# Patient Record
Sex: Female | Born: 1950
Health system: Southern US, Community
[De-identification: ages and names within clinical notes are randomized; demographics above are authoritative.]

## PROBLEM LIST (undated history)

## (undated) DIAGNOSIS — N200 Calculus of kidney: Secondary | ICD-10-CM

## (undated) DIAGNOSIS — K2270 Barrett's esophagus without dysplasia: Secondary | ICD-10-CM

## (undated) DIAGNOSIS — E66811 Obesity, class 1: Secondary | ICD-10-CM

## (undated) DIAGNOSIS — E041 Nontoxic single thyroid nodule: Secondary | ICD-10-CM

## (undated) DIAGNOSIS — E559 Vitamin D deficiency, unspecified: Secondary | ICD-10-CM

## (undated) DIAGNOSIS — I87323 Chronic venous hypertension (idiopathic) with inflammation of bilateral lower extremity: Secondary | ICD-10-CM

## (undated) DIAGNOSIS — J189 Pneumonia, unspecified organism: Secondary | ICD-10-CM

## (undated) DIAGNOSIS — K915 Postcholecystectomy syndrome: Secondary | ICD-10-CM

## (undated) DIAGNOSIS — E669 Obesity, unspecified: Secondary | ICD-10-CM

## (undated) DIAGNOSIS — M8580 Other specified disorders of bone density and structure, unspecified site: Secondary | ICD-10-CM

## (undated) HISTORY — DX: Nontoxic single thyroid nodule: E04.1

## (undated) HISTORY — DX: Calculus of kidney: N20.0

## (undated) HISTORY — DX: Other specified disorders of bone density and structure, unspecified site: M85.80

## (undated) HISTORY — DX: Pneumonia, unspecified organism: J18.9

## (undated) HISTORY — PX: CHOLECYSTECTOMY: SHX55

## (undated) HISTORY — DX: Chronic venous hypertension (idiopathic) with inflammation of bilateral lower extremity: I87.323

## (undated) HISTORY — DX: Barrett's esophagus without dysplasia: K22.70

## (undated) HISTORY — DX: Obesity, unspecified: E66.9

## (undated) HISTORY — PX: LITHOTRIPSY: SUR834

## (undated) HISTORY — DX: Vitamin D deficiency, unspecified: E55.9

## (undated) HISTORY — DX: Obesity, class 1: E66.811

## (undated) HISTORY — DX: Postcholecystectomy syndrome: K91.5

## (undated) HISTORY — PX: TOTAL KNEE ARTHROPLASTY: SHX125

---

## 1970-05-06 HISTORY — PX: TONSILLECTOMY: SUR1361

## 1990-05-06 HISTORY — PX: ABDOMINAL HYSTERECTOMY: SHX81

## 1997-11-29 ENCOUNTER — Other Ambulatory Visit: Admission: RE | Admit: 1997-11-29 | Discharge: 1997-11-29 | Payer: Self-pay | Admitting: Obstetrics & Gynecology

## 1997-12-27 ENCOUNTER — Encounter: Payer: Self-pay | Admitting: Obstetrics & Gynecology

## 1997-12-27 ENCOUNTER — Ambulatory Visit (HOSPITAL_COMMUNITY): Admission: RE | Admit: 1997-12-27 | Discharge: 1997-12-27 | Payer: Self-pay | Admitting: Obstetrics & Gynecology

## 1998-05-06 HISTORY — PX: ANKLE FRACTURE SURGERY: SHX122

## 1998-10-03 ENCOUNTER — Ambulatory Visit (HOSPITAL_COMMUNITY): Admission: RE | Admit: 1998-10-03 | Discharge: 1998-10-03 | Payer: Self-pay | Admitting: Oncology

## 1998-11-15 ENCOUNTER — Other Ambulatory Visit: Admission: RE | Admit: 1998-11-15 | Discharge: 1998-11-15 | Payer: Self-pay | Admitting: Obstetrics & Gynecology

## 1999-02-05 ENCOUNTER — Encounter: Payer: Self-pay | Admitting: Obstetrics & Gynecology

## 1999-02-05 ENCOUNTER — Ambulatory Visit (HOSPITAL_COMMUNITY): Admission: RE | Admit: 1999-02-05 | Discharge: 1999-02-05 | Payer: Self-pay | Admitting: Obstetrics & Gynecology

## 1999-11-26 ENCOUNTER — Other Ambulatory Visit: Admission: RE | Admit: 1999-11-26 | Discharge: 1999-11-26 | Payer: Self-pay | Admitting: Obstetrics & Gynecology

## 2000-12-30 ENCOUNTER — Other Ambulatory Visit: Admission: RE | Admit: 2000-12-30 | Discharge: 2000-12-30 | Payer: Self-pay | Admitting: Obstetrics & Gynecology

## 2002-01-22 ENCOUNTER — Other Ambulatory Visit: Admission: RE | Admit: 2002-01-22 | Discharge: 2002-01-22 | Payer: Self-pay | Admitting: Obstetrics & Gynecology

## 2003-02-21 ENCOUNTER — Other Ambulatory Visit: Admission: RE | Admit: 2003-02-21 | Discharge: 2003-02-21 | Payer: Self-pay | Admitting: Family Medicine

## 2003-05-07 HISTORY — PX: GALLBLADDER SURGERY: SHX652

## 2003-05-26 ENCOUNTER — Encounter (INDEPENDENT_AMBULATORY_CARE_PROVIDER_SITE_OTHER): Payer: Self-pay | Admitting: Specialist

## 2003-05-26 ENCOUNTER — Ambulatory Visit (HOSPITAL_COMMUNITY): Admission: RE | Admit: 2003-05-26 | Discharge: 2003-05-27 | Payer: Self-pay | Admitting: Otolaryngology

## 2004-04-12 ENCOUNTER — Other Ambulatory Visit: Admission: RE | Admit: 2004-04-12 | Discharge: 2004-04-12 | Payer: Self-pay | Admitting: Obstetrics & Gynecology

## 2005-05-27 ENCOUNTER — Other Ambulatory Visit: Admission: RE | Admit: 2005-05-27 | Discharge: 2005-05-27 | Payer: Self-pay | Admitting: Obstetrics & Gynecology

## 2006-08-28 ENCOUNTER — Other Ambulatory Visit: Admission: RE | Admit: 2006-08-28 | Discharge: 2006-08-28 | Payer: Self-pay | Admitting: Interventional Radiology

## 2006-08-28 ENCOUNTER — Encounter (INDEPENDENT_AMBULATORY_CARE_PROVIDER_SITE_OTHER): Payer: Self-pay | Admitting: Specialist

## 2006-08-28 ENCOUNTER — Encounter: Admission: RE | Admit: 2006-08-28 | Discharge: 2006-08-28 | Payer: Self-pay | Admitting: Endocrinology

## 2007-02-16 ENCOUNTER — Encounter: Admission: RE | Admit: 2007-02-16 | Discharge: 2007-02-16 | Payer: Self-pay | Admitting: Endocrinology

## 2007-08-17 ENCOUNTER — Encounter: Admission: RE | Admit: 2007-08-17 | Discharge: 2007-08-17 | Payer: Self-pay | Admitting: Endocrinology

## 2010-05-06 HISTORY — PX: OTHER SURGICAL HISTORY: SHX169

## 2010-09-04 ENCOUNTER — Encounter (INDEPENDENT_AMBULATORY_CARE_PROVIDER_SITE_OTHER): Payer: Self-pay | Admitting: Surgery

## 2010-09-21 NOTE — Discharge Summary (Signed)
NAME:  Molly Hogan, Molly Hogan                     ACCOUNT NO.:  [id number]   MEDICAL RECORD NO.:  [id number]                   PATIENT TYPE:  OIB   LOCATION:  6706                                 FACILITY:  [facility]   PHYSICIAN:  Kinnie Scales. Annalee Genta, M.D.            DATE OF BIRTH:  [birth year]   DATE OF ADMISSION:  05/26/2003  DATE OF DISCHARGE:  05/27/2003                                 DISCHARGE SUMMARY   ADMISSION DIAGNOSIS:  Right submandibular mass.   DISCHARGE DIAGNOSIS:  Right submandibular mass.   SURGICAL PROCEDURE:  Transcervical excision of right submandibular gland, on  May 26, 2003.   The patient was discharged to home in stable condition.   PREOPERATIVE MEDICATIONS:  1. Nexium 40 mg p.o. b.i.d.  2. Advair two puffs p.o. q. day.  3. Oral vitamins.   ADDITIONAL DISCHARGE MEDICATIONS:  1. Percocet 5/325 one to two q. 4-6h. p.r.n. pain, dispense #30 without     refills.  2. Augmentin 500 mg p.o. b.i.d. for ten days.   ACTIVITY:  Patient activity limited, no lifting or straining or vigorous  physical activity.   DIET:  Should be advanced as tolerated to a soft diet.   WOUND CARE:  One-half strength hydrogen peroxide and Bacitracin ointment  applied to the surgical incision on a twice daily basis.   The patient will follow up in my office in one week for suture removal,  pathology review, and further postoperative care.   BRIEF HISTORY:  The patient is a 60 year old white female who is referred  for evaluation of a gradually enlarging mass in her right submandibular  space.  Evaluation revealed a 2 cm soft mass consistent with a right  submandibular gland.  CT scan was performed and there was no evidence of  mass or tumor within the gland and no lymphadenopathy or other abnormalities  noted.  Over the next several months the patient gradually had continued  asymptomatic enlargement of the right submandibular gland, and given her  history, I recommended that we  undertake transcervical excision of the gland  under general anesthesia.  The risks and benefits were discussed and the  patient was scheduled for admission to [facility] for surgical  procedure.   HOSPITAL COURSE:  The patient was brought to the operating room of [facility] to the main OR on May 26, 2003.  Under general anesthesia  a transcervical excision of the right submandibular gland was performed  without complications or difficulty.  The patient was transferred from the  operating room to recovery and from recovery to unit 6700 for postoperative  care.   The patient's postoperative course was uneventful.  She had moderate  discomfort, which was controlled with intravenous morphine or oral Percocet.  She was tolerating liquids and soft oral diet.  She was ambulating without  difficulty.  Normal bowel and bladder function.  No postoperative  complications.  On the first postoperative morning  the patient was  evaluated, there was no swelling or erythema of the wound, and her Al Pimple drain output had fallen to 10 cc per shift.  The drain was removed  without difficulty, and the patient was discharged to home in stable  condition, with the above discharge instructions.  The patient and her  husband are comfortable with the discharge planning, and will follow up with  me in one week for postoperative or sooner if warranted.                                                Kinnie Scales. Annalee Genta, M.D.    DLS/MEDQ  D:  78/29/5621  T:  05/28/2003  Job:  [id number]

## 2010-09-21 NOTE — Op Note (Signed)
NAME:  Molly Hogan, Molly Hogan                     ACCOUNT NO.:  [id number]   MEDICAL RECORD NO.:  [id number]                   PATIENT TYPE:  OIB   LOCATION:  2899                                 FACILITY:  [facility]   PHYSICIAN:  Kinnie Scales. Annalee Genta, M.D.            DATE OF BIRTH:  Aug 09, 1950   DATE OF PROCEDURE:  DATE OF DISCHARGE:                                 OPERATIVE REPORT   PREOPERATIVE DIAGNOSES:  Right submandibular mass.   POSTOPERATIVE DIAGNOSIS:  Right submandibular mass.   INDICATIONS FOR SURGERY:  Right submandibular mass.   SURGICAL PROCEDURE:  Transcervical excision of the right submandibular  gland.   ANESTHESIA:  General endotracheal.   SURGEON:  Kinnie Scales. Annalee Genta, M.D.   ASSISTANT:  Kathy Breach, M.D.   ESTIMATED BLOOD LOSS:  Approximately 50 cc.   COMPLICATIONS:  There were no complications.   DISPOSITION:  The patient was transferred from the operating room to the  recovery room in stable condition.   HISTORY:  Molly Hogan is a 60 year old white female who was referred for  evaluation of a gradually enlarging right submandibular mass.  Workup  included physical exam which showed a soft, approximately 2-cm firm mass in  the right submandibular space.  CT scan with and without contrast showed no  evidence of cervical lymphadenopathy or thyroid abnormality and the mass  appeared to be contained within the right submandibular gland.  Given the  patient's history, examination, and findings, I recommended that we  undertake transcervical excisional biopsy of the right submandibular gland  under general anesthesia.  The risks and benefits of the procedure were  discussed in detail.  The patient understood and concurred with our plan for  surgery, which is scheduled for May 26, 2003.   DESCRIPTION OF PROCEDURE:  The patient was brought to the operating room on  May 26, 2003, and placed in the supine position on the operating table.  General  endotracheal anesthesia was established without difficulty.  Once  the patient was adequately anesthetized, she was prepped and draped in  sterile fashion.  She was injected with 2 cc of 1% lidocaine with 1:100,000  solution of epinephrine injected in a subcutaneous fashion in the right  submandibular space along the proposed surgical incision site.  After  allowing adequate time for vasoconstriction, the patient's surgical  procedure was begun by creating an approximately 4-cm horizontally oriented  skin incision in a pre-existing skin crease.  This was carried through the  skin and underlying subcutaneous tissue.  Platysma muscle was identified and  divided.  The subplatysmal flaps were then elevated superiorly and  inferiorly.  The inferior aspect of the right submandibular gland was  identified and the overlying superficial fascia of the gland was elevated  superiorly, preserving the marginal mandibular nerve on the right-hand side.  The right submandibular gland was then gently dissected from the surrounding  tissues.  The common facial vein and artery were identified  and their  contributions to the right submandibular gland were divided and suture  ligated.  The gland was then mobilized.  The mylohyoid muscle was identified  and dissected anteriorly.  This allowed access to the submandibular space.  The submandibular duct was identified, divided, and suture ligated.  The  lingual nerve and hypoglossal nerves were identified in their normal  anatomic position.  Lingual nerve contribution to the submandibular gland  was identified, divided, and suture ligated, and the gland was removed from  the submandibular space and sent to Pathology for gross and microscopic  evaluation.  The patient's incision was then thoroughly irrigated with  saline solution and closed in multiple layers after placement of a 7-mm  Blake drain, which was brought out through a separate stab incision and  placed in  the depth of the wound.  The patient's incision was closed with 4-  0 Vicryl suture in interrupted fashion to reapproximate the platysma muscle  and deep subcutaneous tissue.  Final skin closure was achieved with a 5-0  Ethilon suture in a running locked fashion and the patient's wound was  dressed with bacitracin ointment.  The patient was then awakened from her  anesthetic.  She was extubated and was transferred from the operating room  to the recovery room in stable condition.  There were no complications.  Estimated blood loss was approximately 50 cc.                                               Kinnie Scales. Annalee Genta, M.D.    DLS/MEDQ  D:  21/30/8657  T:  05/26/2003  Job:  [id number]

## 2011-03-12 ENCOUNTER — Other Ambulatory Visit: Payer: Self-pay | Admitting: Obstetrics & Gynecology

## 2011-03-12 DIAGNOSIS — R928 Other abnormal and inconclusive findings on diagnostic imaging of breast: Secondary | ICD-10-CM

## 2011-03-29 ENCOUNTER — Ambulatory Visit
Admission: RE | Admit: 2011-03-29 | Discharge: 2011-03-29 | Disposition: A | Discharge status: Home / Self Care | Payer: 59 | Source: Ambulatory Visit | Attending: Obstetrics & Gynecology | Admitting: Obstetrics & Gynecology

## 2011-03-29 DIAGNOSIS — R928 Other abnormal and inconclusive findings on diagnostic imaging of breast: Secondary | ICD-10-CM

## 2011-05-07 HISTORY — PX: MENISCUS REPAIR: SHX5179

## 2011-05-28 ENCOUNTER — Other Ambulatory Visit (INDEPENDENT_AMBULATORY_CARE_PROVIDER_SITE_OTHER): Payer: Self-pay | Admitting: Surgery

## 2011-05-28 ENCOUNTER — Telehealth (INDEPENDENT_AMBULATORY_CARE_PROVIDER_SITE_OTHER): Payer: Self-pay | Admitting: Surgery

## 2011-05-28 DIAGNOSIS — E042 Nontoxic multinodular goiter: Secondary | ICD-10-CM

## 2011-05-28 NOTE — Telephone Encounter (Signed)
Robin,I made Ms Rincon a thyroid apt for 3/15..now she needs  u/s and labs done @ [facility] .Marland KitchenMarland KitchenThanks Liborio Nixon

## 2011-05-28 NOTE — Telephone Encounter (Signed)
Lab slip to [employer]- Ultrasound or placed to Debra to schedule 05/28/2011.

## 2011-06-12 ENCOUNTER — Ambulatory Visit
Admission: RE | Admit: 2011-06-12 | Discharge: 2011-06-12 | Disposition: A | Discharge status: Home / Self Care | Payer: 59 | Source: Ambulatory Visit | Attending: Surgery | Admitting: Surgery

## 2011-06-12 DIAGNOSIS — E042 Nontoxic multinodular goiter: Secondary | ICD-10-CM

## 2011-06-14 ENCOUNTER — Other Ambulatory Visit: Payer: 59

## 2011-07-04 ENCOUNTER — Encounter (INDEPENDENT_AMBULATORY_CARE_PROVIDER_SITE_OTHER): Payer: Self-pay | Admitting: Surgery

## 2011-07-08 ENCOUNTER — Encounter (INDEPENDENT_AMBULATORY_CARE_PROVIDER_SITE_OTHER): Payer: Self-pay | Admitting: Surgery

## 2011-07-08 ENCOUNTER — Ambulatory Visit (INDEPENDENT_AMBULATORY_CARE_PROVIDER_SITE_OTHER): Payer: 59 | Admitting: Surgery

## 2011-07-08 VITALS — BP 122/80 | HR 68 | Temp 97.8°F | Resp 16 | Ht 63.0 in | Wt 191.0 lb

## 2011-07-08 DIAGNOSIS — E042 Nontoxic multinodular goiter: Secondary | ICD-10-CM

## 2011-07-08 HISTORY — DX: Nontoxic multinodular goiter: E04.2

## 2011-07-08 NOTE — Progress Notes (Signed)
Visit Diagnoses: 1. Multinodular goiter (nontoxic)     HISTORY: Patient is a 61 year old white female followed for bilateral thyroid nodules. Patient has diagnostic studies dating back to 1995. She was last seen in my practice in October of 2010. At my request she underwent a followup thyroid ultrasound on June 12, 2011. This shows a normal sinus thyroid gland which is homogeneous. There is a tiny nodule in the inferior aspect of the right lobe measuring 4.8 mm. There is a complex nodule in the left lobe measuring 1.5 cm. These lesions are unchanged compared to her study in 2009.  Patient had a TSH level obtained on June 19, 2011. This was normal at 2.470. Patient has never been on thyroid hormone supplementation.  PERTINENT REVIEW OF SYSTEMS: No tremors. No palpitations. No new masses. No dysphagia. Mild globus sensation.  EXAM: HEENT: normocephalic; pupils equal and reactive; sclerae clear; dentition good; mucous membranes moist NECK:  Palpable soft nodule mid left thyroid lobe, mobile with swallowing, nontender; symmetric on extension; no palpable anterior or posterior cervical lymphadenopathy; no supraclavicular masses; no tenderness CHEST: clear to auscultation bilaterally without rales, rhonchi, or wheezes CARDIAC: regular rate and rhythm without significant murmur; peripheral pulses are full EXT:  non-tender without edema; no deformity NEURO: no gross focal deficits; no sign of tremor   IMPRESSION: Bilateral thyroid nodules, clinically stable  PLAN: The patient and I discussed the significance of the above findings. It is likely that these nodules will never become clinically significant. We will obtain a thyroid ultrasound and TSH level in 2 years. I will see her back in the office at that time. If her thyroid disease remains stable, then I think she can be followed safely by her primary care physician without further surgical followup unless there is a significant clinical  change.  Patient will return in 2 years for final evaluation.  Velora Heckler, MD, FACS General & Endocrine Surgery [facility]

## 2011-07-17 ENCOUNTER — Encounter (INDEPENDENT_AMBULATORY_CARE_PROVIDER_SITE_OTHER): Payer: Self-pay

## 2012-12-22 ENCOUNTER — Other Ambulatory Visit: Payer: Self-pay | Admitting: Obstetrics & Gynecology

## 2012-12-22 DIAGNOSIS — R234 Changes in skin texture: Secondary | ICD-10-CM

## 2012-12-22 DIAGNOSIS — N6451 Induration of breast: Secondary | ICD-10-CM

## 2012-12-22 DIAGNOSIS — N6459 Other signs and symptoms in breast: Secondary | ICD-10-CM

## 2013-01-07 ENCOUNTER — Other Ambulatory Visit: Payer: 59

## 2013-01-08 ENCOUNTER — Ambulatory Visit
Admission: RE | Admit: 2013-01-08 | Discharge: 2013-01-08 | Disposition: A | Discharge status: Home / Self Care | Payer: 59 | Source: Ambulatory Visit | Attending: Obstetrics & Gynecology | Admitting: Obstetrics & Gynecology

## 2013-01-08 DIAGNOSIS — R234 Changes in skin texture: Secondary | ICD-10-CM

## 2013-01-08 DIAGNOSIS — N6459 Other signs and symptoms in breast: Secondary | ICD-10-CM

## 2013-03-30 ENCOUNTER — Other Ambulatory Visit: Payer: Self-pay | Admitting: Obstetrics & Gynecology

## 2013-03-30 DIAGNOSIS — Z803 Family history of malignant neoplasm of breast: Secondary | ICD-10-CM

## 2013-04-13 ENCOUNTER — Ambulatory Visit
Admission: RE | Admit: 2013-04-13 | Discharge: 2013-04-13 | Disposition: A | Discharge status: Home / Self Care | Payer: 59 | Source: Ambulatory Visit | Attending: Obstetrics & Gynecology | Admitting: Obstetrics & Gynecology

## 2013-04-13 DIAGNOSIS — Z803 Family history of malignant neoplasm of breast: Secondary | ICD-10-CM

## 2013-04-13 MED ORDER — GADOBENATE DIMEGLUMINE 529 MG/ML IV SOLN
18.0000 mL | Freq: Once | INTRAVENOUS | Status: AC | PRN
  Administered 2013-04-13: 18 mL via INTRAVENOUS

## 2013-05-10 ENCOUNTER — Other Ambulatory Visit (INDEPENDENT_AMBULATORY_CARE_PROVIDER_SITE_OTHER): Payer: Self-pay

## 2013-05-10 ENCOUNTER — Telehealth (INDEPENDENT_AMBULATORY_CARE_PROVIDER_SITE_OTHER): Payer: Self-pay | Admitting: *Deleted

## 2013-05-10 DIAGNOSIS — E042 Nontoxic multinodular goiter: Secondary | ICD-10-CM

## 2013-05-10 NOTE — Telephone Encounter (Signed)
I spoke with pt and informed her of the appt for her thyroid US at [facility] on 05/14/13 with an arrival time of 1:00pm.  She is agreeable with this appt.

## 2013-05-13 ENCOUNTER — Other Ambulatory Visit: Payer: 59

## 2013-05-14 ENCOUNTER — Ambulatory Visit
Admission: RE | Admit: 2013-05-14 | Discharge: 2013-05-14 | Disposition: A | Discharge status: Home / Self Care | Payer: 59 | Source: Ambulatory Visit | Attending: Surgery | Admitting: Surgery

## 2013-05-14 DIAGNOSIS — E042 Nontoxic multinodular goiter: Secondary | ICD-10-CM

## 2013-05-17 ENCOUNTER — Encounter (INDEPENDENT_AMBULATORY_CARE_PROVIDER_SITE_OTHER): Payer: Self-pay | Admitting: Surgery

## 2013-05-17 ENCOUNTER — Ambulatory Visit (INDEPENDENT_AMBULATORY_CARE_PROVIDER_SITE_OTHER): Payer: 59 | Admitting: Surgery

## 2013-05-17 VITALS — BP 128/84 | HR 72 | Temp 98.1°F | Resp 16 | Ht 62.0 in | Wt 197.2 lb

## 2013-05-17 DIAGNOSIS — E042 Nontoxic multinodular goiter: Secondary | ICD-10-CM

## 2013-05-17 LAB — TSH: TSH: 2.564 u[IU]/mL (ref 0.350–4.500)

## 2013-05-17 NOTE — Patient Instructions (Signed)

## 2013-05-17 NOTE — Progress Notes (Signed)
General Surgery [facility]  Chief Complaint  Patient presents with  . Follow-up    thyroid nodules    HISTORY: Patient is a 63 year old female followed for bilateral thyroid nodules. Patient was last evaluated in March of 2013. In the interim she has had no significant changes in the management of her thyroid.  At my request she underwent a thyroid ultrasound on 05/14/2013. This shows a normal-sized thyroid gland with the right lobe measuring 4 cm and left lobe measuring 3.8 cm. There is a 6 mm cystic nodule in the lower pole of the right lobe which is unchanged from prior studies. There is a 15 mm nodule in the left lobe which is slightly decreased in size from her prior studies. No new nodules are identified very at no lymphadenopathy is identified.  Patient has not had a recent TSH level determination.  PERTINENT REVIEW OF SYSTEMS: Denies tremor. Denies palpitation. Denies new masses. Occasional shortness of breath.  EXAM: HEENT: normocephalic; pupils equal and reactive; sclerae clear; dentition good; mucous membranes moist NECK:  Thyroid gland is soft without discrete or dominant nodule palpable; symmetric on extension; no palpable anterior or posterior cervical lymphadenopathy; no supraclavicular masses; no tenderness CHEST: clear to auscultation bilaterally without rales, rhonchi, or wheezes CARDIAC: regular rate and rhythm without significant murmur; peripheral pulses are full EXT:  non-tender without edema; no deformity NEURO: no gross focal deficits; no sign of tremor   IMPRESSION: Bilateral thyroid nodules, clinically stable  PLAN: Patient and I reviewed the above results at length. We reviewed her ultrasound report. She has not had a recent TSH level and we will give her a laboratory slip to have that done later today.  At this point she does not require further surgical evaluation. Bilateral thyroid nodules have been stable over a number of years.  Patient should have annual physical examination. She should have an annual TSH level determination.  Patient will return for surgical care as needed.  Velora Heckler, MD, [facility] Office: [phone]  Visit Diagnoses: 1. Multiple thyroid nodules

## 2013-05-18 ENCOUNTER — Telehealth (INDEPENDENT_AMBULATORY_CARE_PROVIDER_SITE_OTHER): Payer: Self-pay | Admitting: General Surgery

## 2013-05-18 NOTE — Telephone Encounter (Signed)
Message copied by Wilder Glade on Tue May 18, 2013 11:26 AM ------      Message from: Velora Heckler      Created: Tue May 18, 2013 11:09 AM       Let patient know that TSH is normal.            Forward to primary MD if known.            Thanks,            tmg            Velora Heckler, MD, [facility]      Office: [phone]             ------

## 2013-05-18 NOTE — Telephone Encounter (Signed)
LMOM to let the patient that her TSH was normal

## 2014-03-14 ENCOUNTER — Other Ambulatory Visit: Payer: Self-pay | Admitting: Obstetrics & Gynecology

## 2014-03-15 LAB — CYTOLOGY - PAP

## 2014-04-02 IMAGING — MG MM DIGITAL DIAGNOSTIC UNILAT*L*
3 series · 3 of 3 positions shown · non-contrast
Comparison: Prior exams

CLINICAL DATA: Diffuse left periareolar/nipple pruritis.  No
palpable mass or skin changes.

DIGITAL DIAGNOSTIC LEFT MAMMOGRAM WITH CAD
Mammographic images were processed with CAD.

[L CC (1 of 2)]
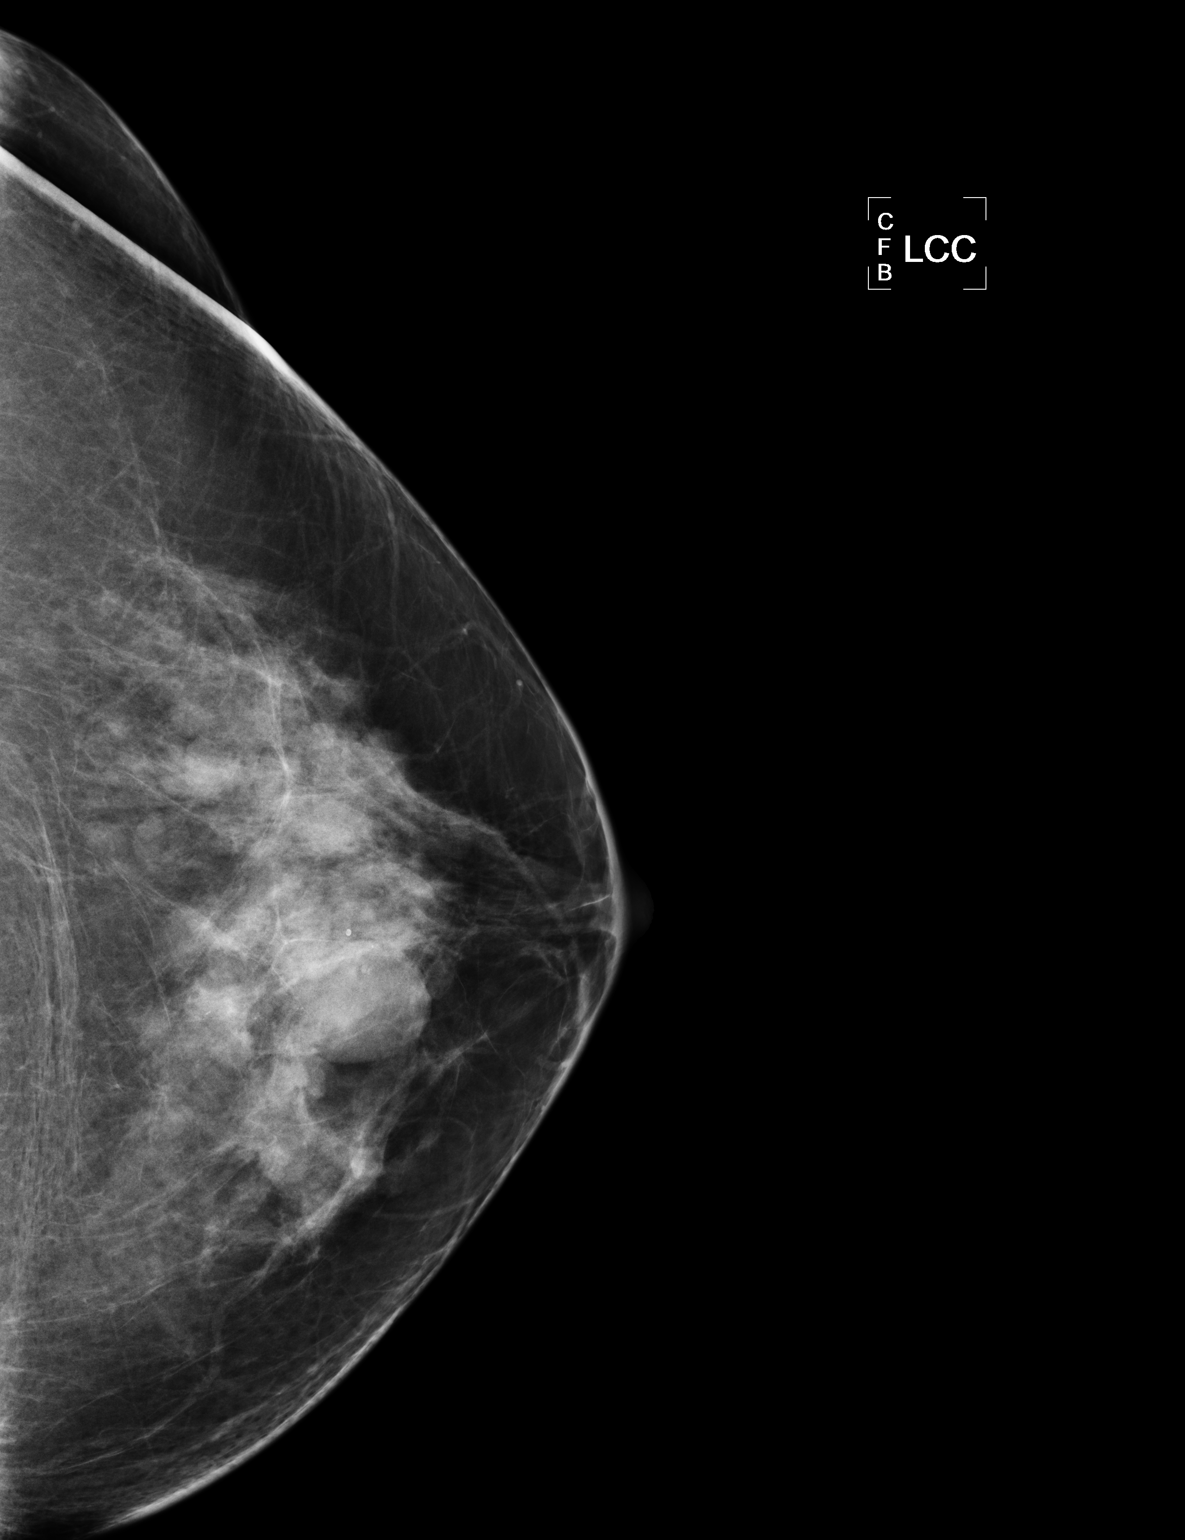

[L MLO]
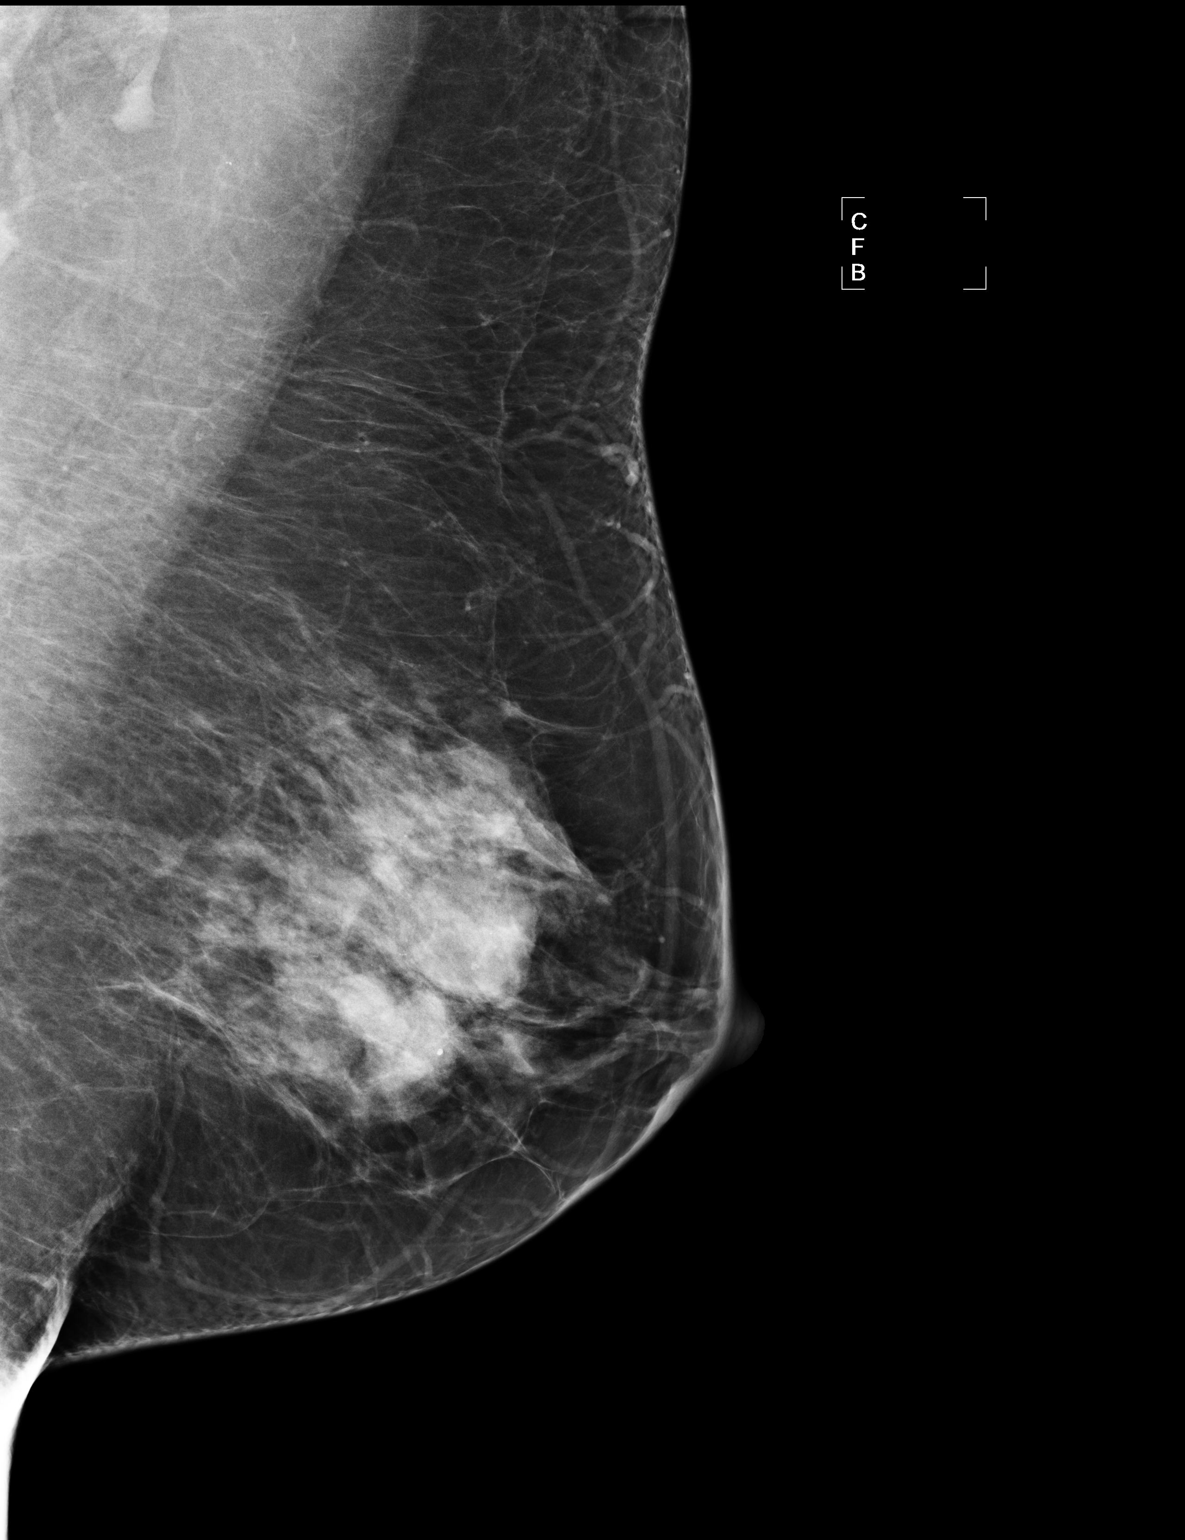

[L CC (2 of 2)]
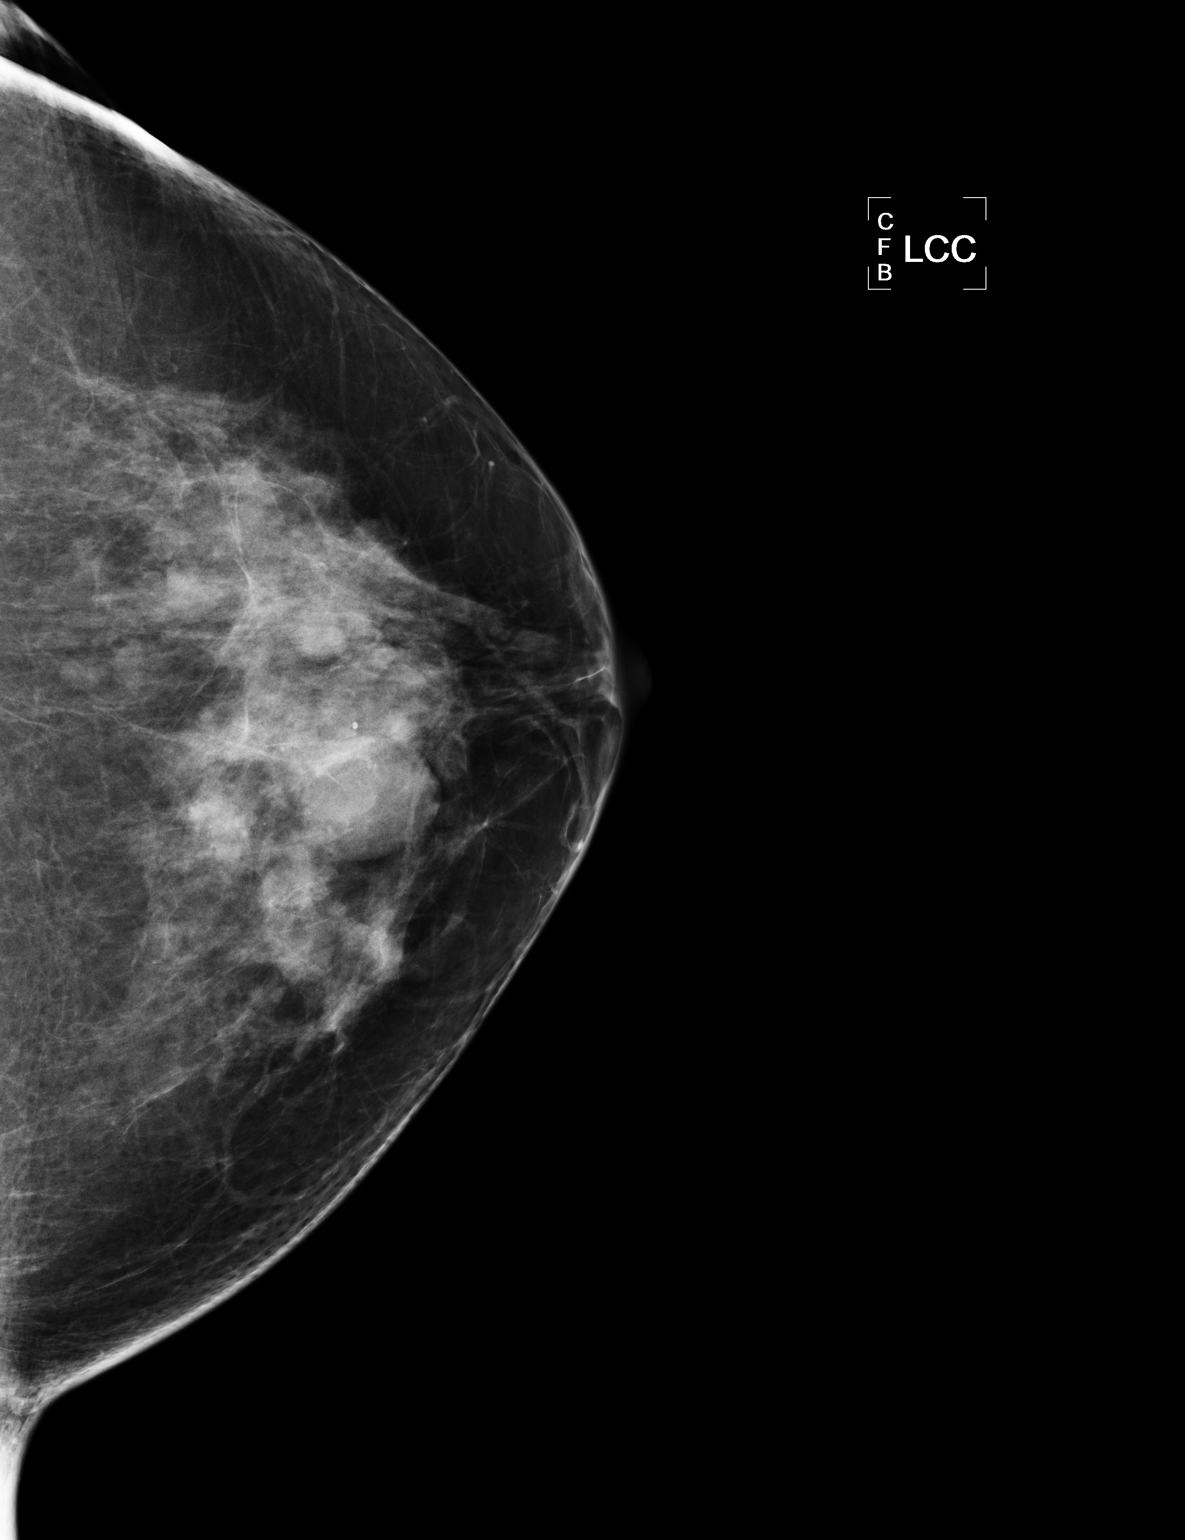

[3 of 3 positions shown; findings below may reference images not displayed]

FINDINGS: ACR Breast Density Category d:  The breast tissue is extremely
dense, which lowers the sensitivity of mammography.

Oval and round waxing and waning left breast masses are re-
identified, compatible with previously evaluated cysts.  No new
suspicious finding is seen in the left breast.

Mammographic images were processed with CAD.
IMPRESSION: No evidence for malignancy.  Waxing waning left breast cysts are re-
identified.  No etiology identified to explain the history of left-
sided pruritis.  Clinical follow-up is suggested.

RECOMMENDATION:
Bilateral screening mammography recommended March 2013

I have discussed the findings and recommendations with the patient.
Results were also provided in writing at the conclusion of the
visit.  If applicable, a reminder letter will be sent to the
patient regarding her next appointment.

BI-RADS CATEGORY 2:  Benign finding(s).

## 2014-08-06 IMAGING — US US SOFT TISSUE HEAD/NECK
1 series · 14 of 25 positions shown · non-contrast
Comparison: 06/12/2011 and prior thyroid ultrasounds dating back to
08/28/2006.

CLINICAL DATA: 62-year-old female -followup thyroid nodules.

EXAM:
THYROID ULTRASOUND
TECHNIQUE: Ultrasound examination of the thyroid gland and adjacent soft
tissues was performed.

[Series 1: us soft tissue head/neck · 0.07mm/px · 14 of 48 slices shown]
[im 1/48]
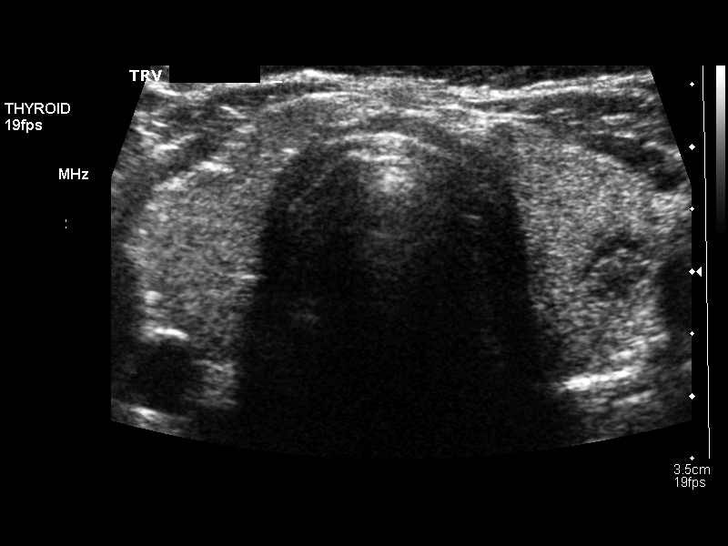
[im 4/48]
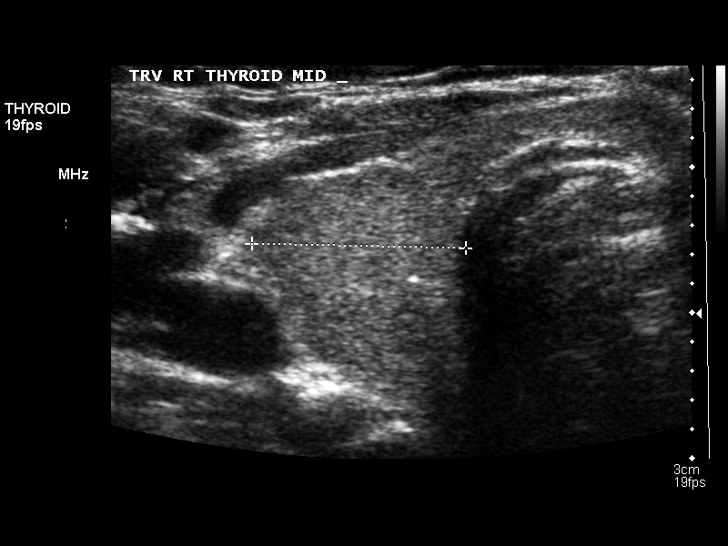
[im 8/48]
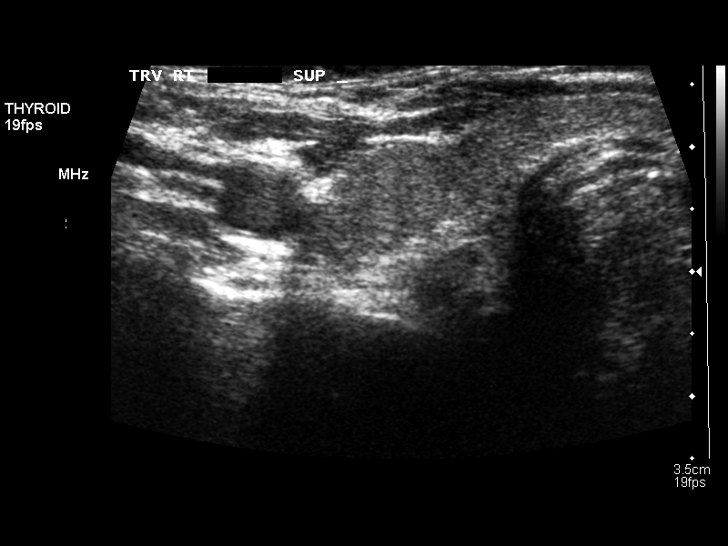
[im 12/48]
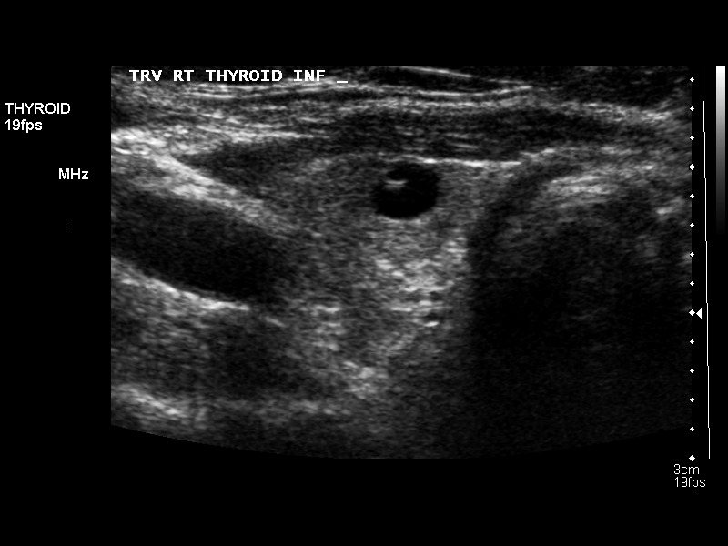
[im 16/48]
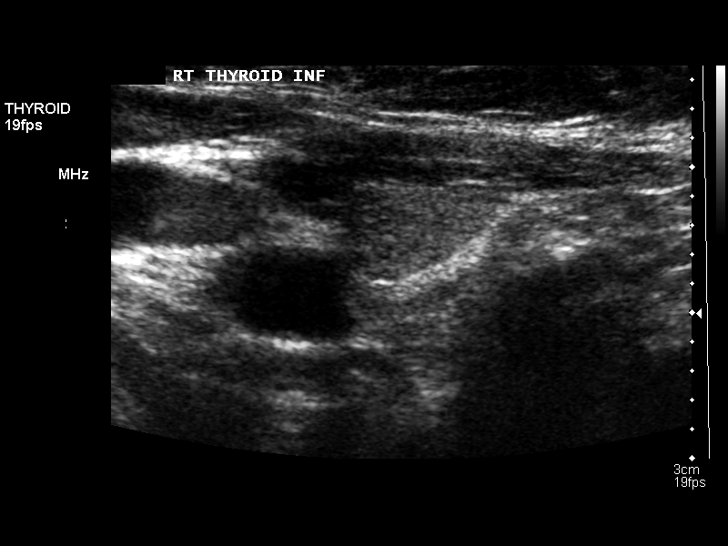
[im 18/48]
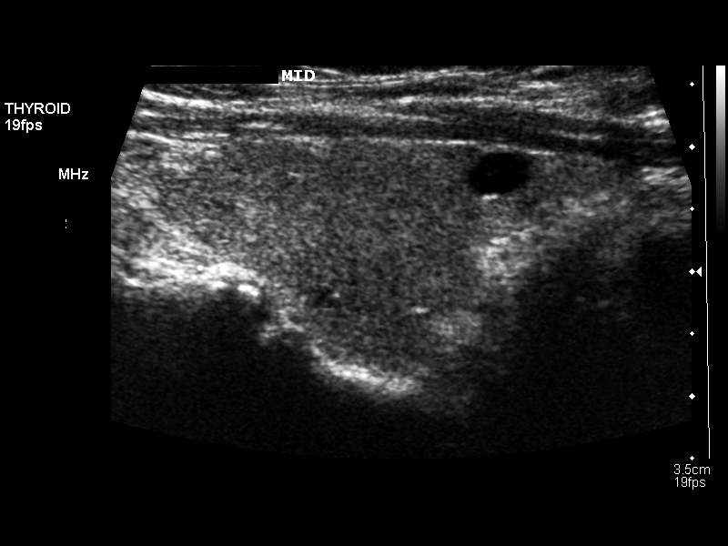
[im 22/48]
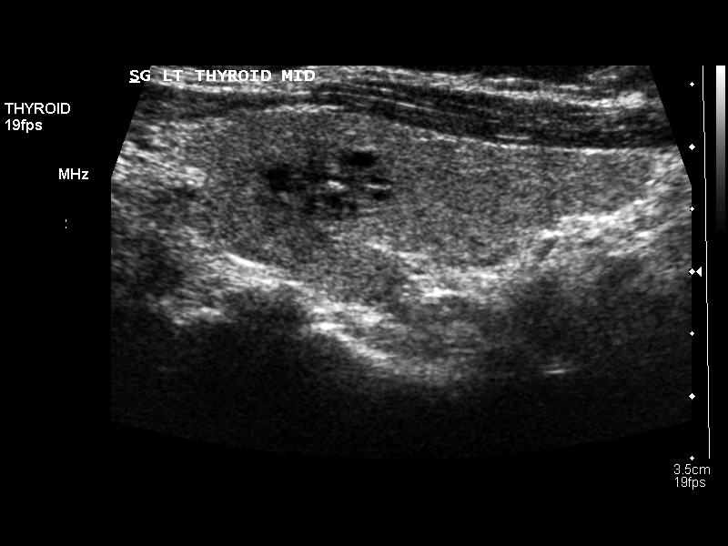
[im 26/48]
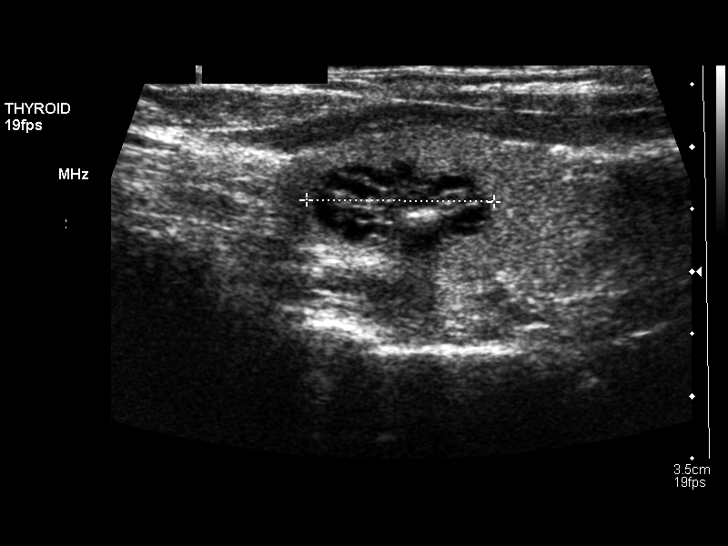
[im 30/48]
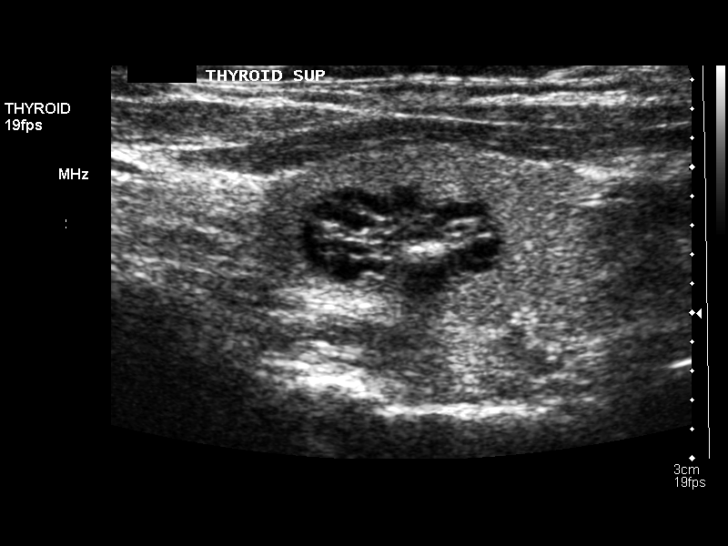
[im 32/48]
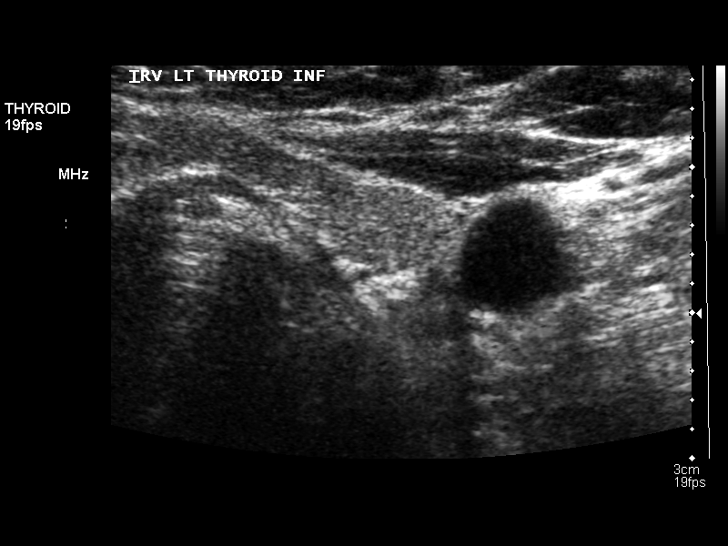
[im 36/48]
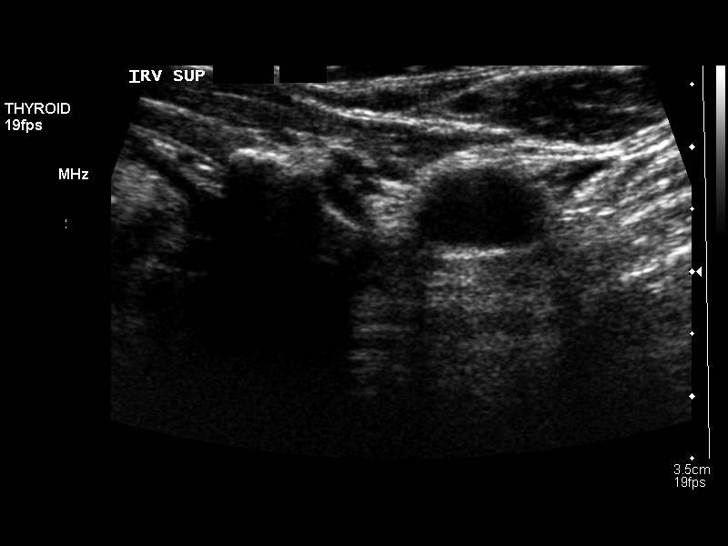
[im 40/48]
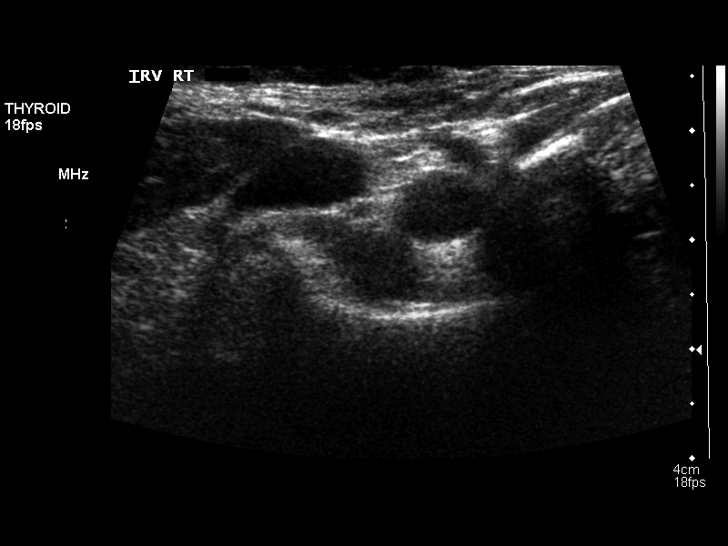
[im 44/48]
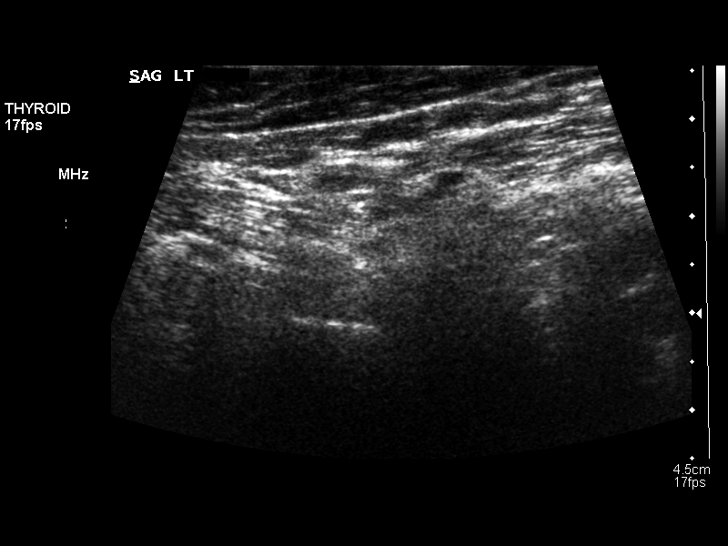
[im 48/48]
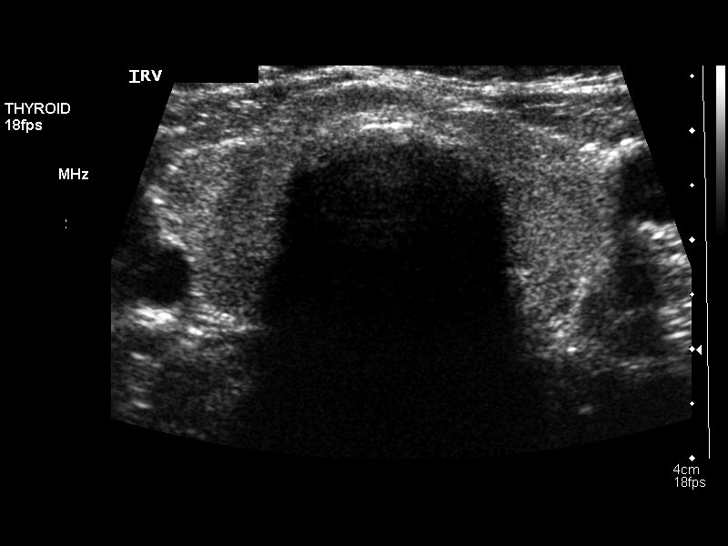

[14 of 25 positions shown; findings below may reference images not displayed]

FINDINGS: Right thyroid lobe

Measurements: 4 x 1.8 x 1.5 cm. A 5 x 4 x 6 mm cystic nodule in the
lower pole is unchanged from the last study. No new nodules are
identified.

Left thyroid lobe

Measurements: 3.8 x 1.6 x 1.7 cm.. A 9 x 8 x 15 mm upper pole nodule
is stable since the prior study and slightly decreased in size from
0225. No new nodules are identified.

Isthmus

Thickness: 3 mm..  No nodules visualized.

Lymphadenopathy

None visualized.
IMPRESSION: Unchanged thyroid ultrasound with stable bilateral thyroid nodules.

## 2015-01-20 ENCOUNTER — Other Ambulatory Visit: Payer: Self-pay

## 2016-02-01 DIAGNOSIS — R233 Spontaneous ecchymoses: Secondary | ICD-10-CM | POA: Diagnosis not present

## 2016-02-01 DIAGNOSIS — L578 Other skin changes due to chronic exposure to nonionizing radiation: Secondary | ICD-10-CM | POA: Diagnosis not present

## 2016-02-07 DIAGNOSIS — K5900 Constipation, unspecified: Secondary | ICD-10-CM | POA: Diagnosis not present

## 2016-02-07 DIAGNOSIS — K210 Gastro-esophageal reflux disease with esophagitis: Secondary | ICD-10-CM | POA: Diagnosis not present

## 2016-02-07 DIAGNOSIS — K2270 Barrett's esophagus without dysplasia: Secondary | ICD-10-CM | POA: Diagnosis not present

## 2016-02-20 DIAGNOSIS — K2270 Barrett's esophagus without dysplasia: Secondary | ICD-10-CM | POA: Diagnosis not present

## 2016-02-20 DIAGNOSIS — K219 Gastro-esophageal reflux disease without esophagitis: Secondary | ICD-10-CM | POA: Diagnosis not present

## 2016-02-20 DIAGNOSIS — Z9889 Other specified postprocedural states: Secondary | ICD-10-CM | POA: Diagnosis not present

## 2016-02-20 DIAGNOSIS — Z8719 Personal history of other diseases of the digestive system: Secondary | ICD-10-CM | POA: Diagnosis not present

## 2016-02-20 DIAGNOSIS — Z86010 Personal history of colonic polyps: Secondary | ICD-10-CM | POA: Diagnosis not present

## 2016-03-29 DIAGNOSIS — J040 Acute laryngitis: Secondary | ICD-10-CM | POA: Diagnosis not present

## 2016-03-29 DIAGNOSIS — J0100 Acute maxillary sinusitis, unspecified: Secondary | ICD-10-CM | POA: Diagnosis not present

## 2016-03-29 DIAGNOSIS — J209 Acute bronchitis, unspecified: Secondary | ICD-10-CM | POA: Diagnosis not present

## 2016-04-15 DIAGNOSIS — Z1231 Encounter for screening mammogram for malignant neoplasm of breast: Secondary | ICD-10-CM | POA: Diagnosis not present

## 2016-04-15 DIAGNOSIS — Z6835 Body mass index (BMI) 35.0-35.9, adult: Secondary | ICD-10-CM | POA: Diagnosis not present

## 2016-04-15 DIAGNOSIS — R351 Nocturia: Secondary | ICD-10-CM | POA: Diagnosis not present

## 2016-04-15 DIAGNOSIS — Z1272 Encounter for screening for malignant neoplasm of vagina: Secondary | ICD-10-CM | POA: Diagnosis not present

## 2016-04-15 DIAGNOSIS — Z124 Encounter for screening for malignant neoplasm of cervix: Secondary | ICD-10-CM | POA: Diagnosis not present

## 2016-04-15 DIAGNOSIS — N762 Acute vulvitis: Secondary | ICD-10-CM | POA: Diagnosis not present

## 2016-05-07 DIAGNOSIS — M8588 Other specified disorders of bone density and structure, other site: Secondary | ICD-10-CM | POA: Diagnosis not present

## 2016-05-07 DIAGNOSIS — N958 Other specified menopausal and perimenopausal disorders: Secondary | ICD-10-CM | POA: Diagnosis not present

## 2016-05-31 DIAGNOSIS — H2513 Age-related nuclear cataract, bilateral: Secondary | ICD-10-CM | POA: Diagnosis not present

## 2016-05-31 DIAGNOSIS — H16223 Keratoconjunctivitis sicca, not specified as Sjogren's, bilateral: Secondary | ICD-10-CM | POA: Diagnosis not present

## 2016-06-05 DIAGNOSIS — Z6836 Body mass index (BMI) 36.0-36.9, adult: Secondary | ICD-10-CM | POA: Diagnosis not present

## 2016-06-05 DIAGNOSIS — I10 Essential (primary) hypertension: Secondary | ICD-10-CM | POA: Diagnosis not present

## 2016-06-05 DIAGNOSIS — M791 Myalgia: Secondary | ICD-10-CM | POA: Diagnosis not present

## 2016-06-05 DIAGNOSIS — M4125 Other idiopathic scoliosis, thoracolumbar region: Secondary | ICD-10-CM | POA: Diagnosis not present

## 2016-06-25 DIAGNOSIS — H81399 Other peripheral vertigo, unspecified ear: Secondary | ICD-10-CM | POA: Diagnosis not present

## 2016-07-31 DIAGNOSIS — G894 Chronic pain syndrome: Secondary | ICD-10-CM | POA: Diagnosis not present

## 2016-07-31 DIAGNOSIS — M7071 Other bursitis of hip, right hip: Secondary | ICD-10-CM | POA: Diagnosis not present

## 2016-07-31 DIAGNOSIS — M4125 Other idiopathic scoliosis, thoracolumbar region: Secondary | ICD-10-CM | POA: Diagnosis not present

## 2016-07-31 DIAGNOSIS — M545 Low back pain: Secondary | ICD-10-CM | POA: Diagnosis not present

## 2016-07-31 DIAGNOSIS — M4716 Other spondylosis with myelopathy, lumbar region: Secondary | ICD-10-CM | POA: Diagnosis not present

## 2016-09-10 DIAGNOSIS — M5430 Sciatica, unspecified side: Secondary | ICD-10-CM | POA: Diagnosis not present

## 2016-09-13 DIAGNOSIS — G894 Chronic pain syndrome: Secondary | ICD-10-CM | POA: Diagnosis not present

## 2016-09-13 DIAGNOSIS — M4716 Other spondylosis with myelopathy, lumbar region: Secondary | ICD-10-CM | POA: Diagnosis not present

## 2016-09-13 DIAGNOSIS — M7072 Other bursitis of hip, left hip: Secondary | ICD-10-CM | POA: Diagnosis not present

## 2016-09-13 DIAGNOSIS — M545 Low back pain: Secondary | ICD-10-CM | POA: Diagnosis not present

## 2016-09-13 DIAGNOSIS — M4125 Other idiopathic scoliosis, thoracolumbar region: Secondary | ICD-10-CM | POA: Diagnosis not present

## 2016-09-20 DIAGNOSIS — M7072 Other bursitis of hip, left hip: Secondary | ICD-10-CM | POA: Diagnosis not present

## 2016-09-20 DIAGNOSIS — M4125 Other idiopathic scoliosis, thoracolumbar region: Secondary | ICD-10-CM | POA: Diagnosis not present

## 2016-09-20 DIAGNOSIS — M791 Myalgia: Secondary | ICD-10-CM | POA: Diagnosis not present

## 2016-09-25 DIAGNOSIS — J189 Pneumonia, unspecified organism: Secondary | ICD-10-CM | POA: Diagnosis not present

## 2016-10-18 DIAGNOSIS — S70262A Insect bite (nonvenomous), left hip, initial encounter: Secondary | ICD-10-CM | POA: Diagnosis not present

## 2016-10-18 DIAGNOSIS — A932 Colorado tick fever: Secondary | ICD-10-CM | POA: Diagnosis not present

## 2016-10-23 DIAGNOSIS — Z6832 Body mass index (BMI) 32.0-32.9, adult: Secondary | ICD-10-CM | POA: Diagnosis not present

## 2016-10-23 DIAGNOSIS — M5432 Sciatica, left side: Secondary | ICD-10-CM | POA: Diagnosis not present

## 2016-10-31 DIAGNOSIS — M25552 Pain in left hip: Secondary | ICD-10-CM | POA: Diagnosis not present

## 2016-11-07 DIAGNOSIS — Z0000 Encounter for general adult medical examination without abnormal findings: Secondary | ICD-10-CM | POA: Diagnosis not present

## 2016-11-07 DIAGNOSIS — S32511A Fracture of superior rim of right pubis, initial encounter for closed fracture: Secondary | ICD-10-CM | POA: Diagnosis not present

## 2016-11-07 DIAGNOSIS — M25552 Pain in left hip: Secondary | ICD-10-CM | POA: Diagnosis not present

## 2016-11-08 DIAGNOSIS — M810 Age-related osteoporosis without current pathological fracture: Secondary | ICD-10-CM | POA: Diagnosis not present

## 2016-11-08 DIAGNOSIS — M84454A Pathological fracture, pelvis, initial encounter for fracture: Secondary | ICD-10-CM | POA: Diagnosis not present

## 2016-11-08 DIAGNOSIS — S32592A Other specified fracture of left pubis, initial encounter for closed fracture: Secondary | ICD-10-CM | POA: Diagnosis not present

## 2016-11-20 DIAGNOSIS — M8589 Other specified disorders of bone density and structure, multiple sites: Secondary | ICD-10-CM | POA: Diagnosis not present

## 2016-11-20 DIAGNOSIS — Z780 Asymptomatic menopausal state: Secondary | ICD-10-CM | POA: Diagnosis not present

## 2016-11-26 DIAGNOSIS — J9601 Acute respiratory failure with hypoxia: Secondary | ICD-10-CM | POA: Diagnosis present

## 2016-11-26 DIAGNOSIS — G92 Toxic encephalopathy: Secondary | ICD-10-CM | POA: Diagnosis present

## 2016-11-26 DIAGNOSIS — Z96652 Presence of left artificial knee joint: Secondary | ICD-10-CM | POA: Diagnosis not present

## 2016-11-26 DIAGNOSIS — J90 Pleural effusion, not elsewhere classified: Secondary | ICD-10-CM | POA: Diagnosis not present

## 2016-11-26 DIAGNOSIS — Z452 Encounter for adjustment and management of vascular access device: Secondary | ICD-10-CM | POA: Diagnosis not present

## 2016-11-26 DIAGNOSIS — A419 Sepsis, unspecified organism: Secondary | ICD-10-CM | POA: Diagnosis not present

## 2016-11-26 DIAGNOSIS — R6521 Severe sepsis with septic shock: Secondary | ICD-10-CM | POA: Diagnosis not present

## 2016-11-26 DIAGNOSIS — R410 Disorientation, unspecified: Secondary | ICD-10-CM | POA: Diagnosis not present

## 2016-11-26 DIAGNOSIS — D65 Disseminated intravascular coagulation [defibrination syndrome]: Secondary | ICD-10-CM | POA: Diagnosis present

## 2016-11-26 DIAGNOSIS — J9690 Respiratory failure, unspecified, unspecified whether with hypoxia or hypercapnia: Secondary | ICD-10-CM | POA: Diagnosis not present

## 2016-11-26 DIAGNOSIS — R0902 Hypoxemia: Secondary | ICD-10-CM | POA: Diagnosis not present

## 2016-11-26 DIAGNOSIS — M7989 Other specified soft tissue disorders: Secondary | ICD-10-CM | POA: Diagnosis not present

## 2016-11-26 DIAGNOSIS — R5381 Other malaise: Secondary | ICD-10-CM | POA: Diagnosis present

## 2016-11-26 DIAGNOSIS — A4151 Sepsis due to Escherichia coli [E. coli]: Secondary | ICD-10-CM | POA: Diagnosis not present

## 2016-11-26 DIAGNOSIS — N201 Calculus of ureter: Secondary | ICD-10-CM | POA: Diagnosis not present

## 2016-11-26 DIAGNOSIS — K219 Gastro-esophageal reflux disease without esophagitis: Secondary | ICD-10-CM | POA: Diagnosis present

## 2016-11-26 DIAGNOSIS — M5489 Other dorsalgia: Secondary | ICD-10-CM | POA: Diagnosis not present

## 2016-11-26 DIAGNOSIS — Z79899 Other long term (current) drug therapy: Secondary | ICD-10-CM | POA: Diagnosis not present

## 2016-11-26 DIAGNOSIS — E872 Acidosis: Secondary | ICD-10-CM | POA: Diagnosis present

## 2016-11-26 DIAGNOSIS — I10 Essential (primary) hypertension: Secondary | ICD-10-CM | POA: Diagnosis present

## 2016-11-26 DIAGNOSIS — N200 Calculus of kidney: Secondary | ICD-10-CM | POA: Diagnosis not present

## 2016-11-26 DIAGNOSIS — R0602 Shortness of breath: Secondary | ICD-10-CM | POA: Diagnosis not present

## 2016-11-26 DIAGNOSIS — I340 Nonrheumatic mitral (valve) insufficiency: Secondary | ICD-10-CM | POA: Diagnosis not present

## 2016-11-26 DIAGNOSIS — R269 Unspecified abnormalities of gait and mobility: Secondary | ICD-10-CM | POA: Diagnosis present

## 2016-11-26 DIAGNOSIS — M79652 Pain in left thigh: Secondary | ICD-10-CM | POA: Diagnosis not present

## 2016-11-26 DIAGNOSIS — J9600 Acute respiratory failure, unspecified whether with hypoxia or hypercapnia: Secondary | ICD-10-CM | POA: Diagnosis not present

## 2016-11-26 DIAGNOSIS — D61818 Other pancytopenia: Secondary | ICD-10-CM | POA: Diagnosis present

## 2016-11-26 DIAGNOSIS — R1110 Vomiting, unspecified: Secondary | ICD-10-CM | POA: Diagnosis not present

## 2016-11-26 DIAGNOSIS — Z9981 Dependence on supplemental oxygen: Secondary | ICD-10-CM | POA: Diagnosis not present

## 2016-11-26 DIAGNOSIS — R7881 Bacteremia: Secondary | ICD-10-CM | POA: Diagnosis not present

## 2016-11-26 DIAGNOSIS — M25562 Pain in left knee: Secondary | ICD-10-CM | POA: Diagnosis not present

## 2016-11-26 DIAGNOSIS — Z23 Encounter for immunization: Secondary | ICD-10-CM | POA: Diagnosis not present

## 2016-11-26 DIAGNOSIS — R05 Cough: Secondary | ICD-10-CM | POA: Diagnosis not present

## 2016-11-26 DIAGNOSIS — R069 Unspecified abnormalities of breathing: Secondary | ICD-10-CM | POA: Diagnosis not present

## 2016-11-26 DIAGNOSIS — Z7982 Long term (current) use of aspirin: Secondary | ICD-10-CM | POA: Diagnosis not present

## 2016-11-26 DIAGNOSIS — N390 Urinary tract infection, site not specified: Secondary | ICD-10-CM | POA: Diagnosis not present

## 2016-11-26 DIAGNOSIS — N3001 Acute cystitis with hematuria: Secondary | ICD-10-CM | POA: Diagnosis not present

## 2016-11-27 DIAGNOSIS — R6521 Severe sepsis with septic shock: Secondary | ICD-10-CM

## 2016-11-27 DIAGNOSIS — I340 Nonrheumatic mitral (valve) insufficiency: Secondary | ICD-10-CM

## 2016-12-02 DIAGNOSIS — M25562 Pain in left knee: Secondary | ICD-10-CM

## 2016-12-02 DIAGNOSIS — R7881 Bacteremia: Secondary | ICD-10-CM

## 2016-12-03 DIAGNOSIS — Z87442 Personal history of urinary calculi: Secondary | ICD-10-CM | POA: Diagnosis not present

## 2016-12-03 DIAGNOSIS — Z791 Long term (current) use of non-steroidal anti-inflammatories (NSAID): Secondary | ICD-10-CM | POA: Diagnosis not present

## 2016-12-03 DIAGNOSIS — N12 Tubulo-interstitial nephritis, not specified as acute or chronic: Secondary | ICD-10-CM | POA: Diagnosis not present

## 2016-12-03 DIAGNOSIS — Z8701 Personal history of pneumonia (recurrent): Secondary | ICD-10-CM | POA: Diagnosis not present

## 2016-12-03 DIAGNOSIS — I10 Essential (primary) hypertension: Secondary | ICD-10-CM | POA: Diagnosis not present

## 2016-12-03 DIAGNOSIS — Z79890 Hormone replacement therapy (postmenopausal): Secondary | ICD-10-CM | POA: Diagnosis not present

## 2016-12-03 DIAGNOSIS — I341 Nonrheumatic mitral (valve) prolapse: Secondary | ICD-10-CM | POA: Diagnosis not present

## 2016-12-03 DIAGNOSIS — M1991 Primary osteoarthritis, unspecified site: Secondary | ICD-10-CM | POA: Diagnosis not present

## 2016-12-03 DIAGNOSIS — Z960 Presence of urogenital implants: Secondary | ICD-10-CM | POA: Diagnosis not present

## 2016-12-03 DIAGNOSIS — M84454D Pathological fracture, pelvis, subsequent encounter for fracture with routine healing: Secondary | ICD-10-CM | POA: Diagnosis not present

## 2016-12-03 DIAGNOSIS — Z79899 Other long term (current) drug therapy: Secondary | ICD-10-CM | POA: Diagnosis not present

## 2016-12-03 DIAGNOSIS — Z96652 Presence of left artificial knee joint: Secondary | ICD-10-CM | POA: Diagnosis not present

## 2016-12-03 DIAGNOSIS — N3001 Acute cystitis with hematuria: Secondary | ICD-10-CM | POA: Diagnosis not present

## 2016-12-05 DIAGNOSIS — D508 Other iron deficiency anemias: Secondary | ICD-10-CM | POA: Diagnosis not present

## 2016-12-05 DIAGNOSIS — N3021 Other chronic cystitis with hematuria: Secondary | ICD-10-CM | POA: Diagnosis not present

## 2016-12-05 DIAGNOSIS — N3090 Cystitis, unspecified without hematuria: Secondary | ICD-10-CM | POA: Diagnosis not present

## 2016-12-05 DIAGNOSIS — N201 Calculus of ureter: Secondary | ICD-10-CM | POA: Diagnosis not present

## 2016-12-06 DIAGNOSIS — R7989 Other specified abnormal findings of blood chemistry: Secondary | ICD-10-CM | POA: Diagnosis not present

## 2016-12-06 DIAGNOSIS — L259 Unspecified contact dermatitis, unspecified cause: Secondary | ICD-10-CM | POA: Diagnosis not present

## 2016-12-06 DIAGNOSIS — D531 Other megaloblastic anemias, not elsewhere classified: Secondary | ICD-10-CM | POA: Diagnosis not present

## 2016-12-06 DIAGNOSIS — E559 Vitamin D deficiency, unspecified: Secondary | ICD-10-CM | POA: Diagnosis not present

## 2016-12-06 DIAGNOSIS — S32592A Other specified fracture of left pubis, initial encounter for closed fracture: Secondary | ICD-10-CM | POA: Diagnosis not present

## 2016-12-06 DIAGNOSIS — M84454A Pathological fracture, pelvis, initial encounter for fracture: Secondary | ICD-10-CM | POA: Diagnosis not present

## 2016-12-06 DIAGNOSIS — M810 Age-related osteoporosis without current pathological fracture: Secondary | ICD-10-CM | POA: Diagnosis not present

## 2016-12-06 DIAGNOSIS — Z8619 Personal history of other infectious and parasitic diseases: Secondary | ICD-10-CM | POA: Diagnosis not present

## 2016-12-06 DIAGNOSIS — N200 Calculus of kidney: Secondary | ICD-10-CM | POA: Diagnosis not present

## 2016-12-06 DIAGNOSIS — M84350G Stress fracture, pelvis, subsequent encounter for fracture with delayed healing: Secondary | ICD-10-CM | POA: Diagnosis not present

## 2016-12-06 DIAGNOSIS — Z6832 Body mass index (BMI) 32.0-32.9, adult: Secondary | ICD-10-CM | POA: Diagnosis not present

## 2016-12-07 DIAGNOSIS — I10 Essential (primary) hypertension: Secondary | ICD-10-CM | POA: Diagnosis not present

## 2016-12-07 DIAGNOSIS — M84454D Pathological fracture, pelvis, subsequent encounter for fracture with routine healing: Secondary | ICD-10-CM | POA: Diagnosis not present

## 2016-12-07 DIAGNOSIS — N3001 Acute cystitis with hematuria: Secondary | ICD-10-CM | POA: Diagnosis not present

## 2016-12-07 DIAGNOSIS — N12 Tubulo-interstitial nephritis, not specified as acute or chronic: Secondary | ICD-10-CM | POA: Diagnosis not present

## 2016-12-07 DIAGNOSIS — I341 Nonrheumatic mitral (valve) prolapse: Secondary | ICD-10-CM | POA: Diagnosis not present

## 2016-12-07 DIAGNOSIS — M1991 Primary osteoarthritis, unspecified site: Secondary | ICD-10-CM | POA: Diagnosis not present

## 2016-12-10 DIAGNOSIS — I341 Nonrheumatic mitral (valve) prolapse: Secondary | ICD-10-CM | POA: Diagnosis not present

## 2016-12-10 DIAGNOSIS — N3001 Acute cystitis with hematuria: Secondary | ICD-10-CM | POA: Diagnosis not present

## 2016-12-10 DIAGNOSIS — N201 Calculus of ureter: Secondary | ICD-10-CM | POA: Diagnosis not present

## 2016-12-10 DIAGNOSIS — N3021 Other chronic cystitis with hematuria: Secondary | ICD-10-CM | POA: Diagnosis not present

## 2016-12-10 DIAGNOSIS — N12 Tubulo-interstitial nephritis, not specified as acute or chronic: Secondary | ICD-10-CM | POA: Diagnosis not present

## 2016-12-10 DIAGNOSIS — I10 Essential (primary) hypertension: Secondary | ICD-10-CM | POA: Diagnosis not present

## 2016-12-10 DIAGNOSIS — M1991 Primary osteoarthritis, unspecified site: Secondary | ICD-10-CM | POA: Diagnosis not present

## 2016-12-10 DIAGNOSIS — N3090 Cystitis, unspecified without hematuria: Secondary | ICD-10-CM | POA: Diagnosis not present

## 2016-12-10 DIAGNOSIS — M84454D Pathological fracture, pelvis, subsequent encounter for fracture with routine healing: Secondary | ICD-10-CM | POA: Diagnosis not present

## 2016-12-11 DIAGNOSIS — N12 Tubulo-interstitial nephritis, not specified as acute or chronic: Secondary | ICD-10-CM | POA: Diagnosis not present

## 2016-12-11 DIAGNOSIS — I341 Nonrheumatic mitral (valve) prolapse: Secondary | ICD-10-CM | POA: Diagnosis not present

## 2016-12-11 DIAGNOSIS — M84454D Pathological fracture, pelvis, subsequent encounter for fracture with routine healing: Secondary | ICD-10-CM | POA: Diagnosis not present

## 2016-12-11 DIAGNOSIS — M1991 Primary osteoarthritis, unspecified site: Secondary | ICD-10-CM | POA: Diagnosis not present

## 2016-12-11 DIAGNOSIS — N3001 Acute cystitis with hematuria: Secondary | ICD-10-CM | POA: Diagnosis not present

## 2016-12-11 DIAGNOSIS — I10 Essential (primary) hypertension: Secondary | ICD-10-CM | POA: Diagnosis not present

## 2016-12-12 DIAGNOSIS — N3001 Acute cystitis with hematuria: Secondary | ICD-10-CM | POA: Diagnosis not present

## 2016-12-12 DIAGNOSIS — M1991 Primary osteoarthritis, unspecified site: Secondary | ICD-10-CM | POA: Diagnosis not present

## 2016-12-12 DIAGNOSIS — N12 Tubulo-interstitial nephritis, not specified as acute or chronic: Secondary | ICD-10-CM | POA: Diagnosis not present

## 2016-12-12 DIAGNOSIS — I341 Nonrheumatic mitral (valve) prolapse: Secondary | ICD-10-CM | POA: Diagnosis not present

## 2016-12-12 DIAGNOSIS — I10 Essential (primary) hypertension: Secondary | ICD-10-CM | POA: Diagnosis not present

## 2016-12-12 DIAGNOSIS — M84454D Pathological fracture, pelvis, subsequent encounter for fracture with routine healing: Secondary | ICD-10-CM | POA: Diagnosis not present

## 2016-12-13 DIAGNOSIS — N3001 Acute cystitis with hematuria: Secondary | ICD-10-CM | POA: Diagnosis not present

## 2016-12-13 DIAGNOSIS — Z8619 Personal history of other infectious and parasitic diseases: Secondary | ICD-10-CM | POA: Diagnosis not present

## 2016-12-13 DIAGNOSIS — D531 Other megaloblastic anemias, not elsewhere classified: Secondary | ICD-10-CM | POA: Diagnosis not present

## 2016-12-13 DIAGNOSIS — E559 Vitamin D deficiency, unspecified: Secondary | ICD-10-CM | POA: Diagnosis not present

## 2016-12-13 DIAGNOSIS — H538 Other visual disturbances: Secondary | ICD-10-CM | POA: Diagnosis not present

## 2016-12-16 DIAGNOSIS — M84454D Pathological fracture, pelvis, subsequent encounter for fracture with routine healing: Secondary | ICD-10-CM | POA: Diagnosis not present

## 2016-12-16 DIAGNOSIS — I10 Essential (primary) hypertension: Secondary | ICD-10-CM | POA: Diagnosis not present

## 2016-12-16 DIAGNOSIS — N3001 Acute cystitis with hematuria: Secondary | ICD-10-CM | POA: Diagnosis not present

## 2016-12-16 DIAGNOSIS — M1991 Primary osteoarthritis, unspecified site: Secondary | ICD-10-CM | POA: Diagnosis not present

## 2016-12-16 DIAGNOSIS — N12 Tubulo-interstitial nephritis, not specified as acute or chronic: Secondary | ICD-10-CM | POA: Diagnosis not present

## 2016-12-16 DIAGNOSIS — I341 Nonrheumatic mitral (valve) prolapse: Secondary | ICD-10-CM | POA: Diagnosis not present

## 2016-12-17 DIAGNOSIS — I10 Essential (primary) hypertension: Secondary | ICD-10-CM | POA: Diagnosis not present

## 2016-12-17 DIAGNOSIS — N12 Tubulo-interstitial nephritis, not specified as acute or chronic: Secondary | ICD-10-CM | POA: Diagnosis not present

## 2016-12-17 DIAGNOSIS — I341 Nonrheumatic mitral (valve) prolapse: Secondary | ICD-10-CM | POA: Diagnosis not present

## 2016-12-17 DIAGNOSIS — M1991 Primary osteoarthritis, unspecified site: Secondary | ICD-10-CM | POA: Diagnosis not present

## 2016-12-17 DIAGNOSIS — N3001 Acute cystitis with hematuria: Secondary | ICD-10-CM | POA: Diagnosis not present

## 2016-12-17 DIAGNOSIS — M84454D Pathological fracture, pelvis, subsequent encounter for fracture with routine healing: Secondary | ICD-10-CM | POA: Diagnosis not present

## 2016-12-19 DIAGNOSIS — N12 Tubulo-interstitial nephritis, not specified as acute or chronic: Secondary | ICD-10-CM | POA: Diagnosis not present

## 2016-12-19 DIAGNOSIS — M1991 Primary osteoarthritis, unspecified site: Secondary | ICD-10-CM | POA: Diagnosis not present

## 2016-12-19 DIAGNOSIS — M84454D Pathological fracture, pelvis, subsequent encounter for fracture with routine healing: Secondary | ICD-10-CM | POA: Diagnosis not present

## 2016-12-19 DIAGNOSIS — N3001 Acute cystitis with hematuria: Secondary | ICD-10-CM | POA: Diagnosis not present

## 2016-12-19 DIAGNOSIS — I341 Nonrheumatic mitral (valve) prolapse: Secondary | ICD-10-CM | POA: Diagnosis not present

## 2016-12-19 DIAGNOSIS — I10 Essential (primary) hypertension: Secondary | ICD-10-CM | POA: Diagnosis not present

## 2016-12-20 DIAGNOSIS — N3001 Acute cystitis with hematuria: Secondary | ICD-10-CM | POA: Diagnosis not present

## 2016-12-20 DIAGNOSIS — D531 Other megaloblastic anemias, not elsewhere classified: Secondary | ICD-10-CM | POA: Diagnosis not present

## 2016-12-23 DIAGNOSIS — M1991 Primary osteoarthritis, unspecified site: Secondary | ICD-10-CM | POA: Diagnosis not present

## 2016-12-23 DIAGNOSIS — N12 Tubulo-interstitial nephritis, not specified as acute or chronic: Secondary | ICD-10-CM | POA: Diagnosis not present

## 2016-12-23 DIAGNOSIS — I10 Essential (primary) hypertension: Secondary | ICD-10-CM | POA: Diagnosis not present

## 2016-12-23 DIAGNOSIS — N3001 Acute cystitis with hematuria: Secondary | ICD-10-CM | POA: Diagnosis not present

## 2016-12-23 DIAGNOSIS — M84454D Pathological fracture, pelvis, subsequent encounter for fracture with routine healing: Secondary | ICD-10-CM | POA: Diagnosis not present

## 2016-12-23 DIAGNOSIS — I341 Nonrheumatic mitral (valve) prolapse: Secondary | ICD-10-CM | POA: Diagnosis not present

## 2016-12-24 DIAGNOSIS — M84454D Pathological fracture, pelvis, subsequent encounter for fracture with routine healing: Secondary | ICD-10-CM | POA: Diagnosis not present

## 2016-12-24 DIAGNOSIS — N12 Tubulo-interstitial nephritis, not specified as acute or chronic: Secondary | ICD-10-CM | POA: Diagnosis not present

## 2016-12-24 DIAGNOSIS — I341 Nonrheumatic mitral (valve) prolapse: Secondary | ICD-10-CM | POA: Diagnosis not present

## 2016-12-24 DIAGNOSIS — N3001 Acute cystitis with hematuria: Secondary | ICD-10-CM | POA: Diagnosis not present

## 2016-12-24 DIAGNOSIS — M1991 Primary osteoarthritis, unspecified site: Secondary | ICD-10-CM | POA: Diagnosis not present

## 2016-12-24 DIAGNOSIS — I10 Essential (primary) hypertension: Secondary | ICD-10-CM | POA: Diagnosis not present

## 2016-12-26 DIAGNOSIS — I341 Nonrheumatic mitral (valve) prolapse: Secondary | ICD-10-CM | POA: Diagnosis not present

## 2016-12-26 DIAGNOSIS — M1991 Primary osteoarthritis, unspecified site: Secondary | ICD-10-CM | POA: Diagnosis not present

## 2016-12-26 DIAGNOSIS — I10 Essential (primary) hypertension: Secondary | ICD-10-CM | POA: Diagnosis not present

## 2016-12-26 DIAGNOSIS — N3001 Acute cystitis with hematuria: Secondary | ICD-10-CM | POA: Diagnosis not present

## 2016-12-26 DIAGNOSIS — N12 Tubulo-interstitial nephritis, not specified as acute or chronic: Secondary | ICD-10-CM | POA: Diagnosis not present

## 2016-12-26 DIAGNOSIS — M84454D Pathological fracture, pelvis, subsequent encounter for fracture with routine healing: Secondary | ICD-10-CM | POA: Diagnosis not present

## 2016-12-27 DIAGNOSIS — D531 Other megaloblastic anemias, not elsewhere classified: Secondary | ICD-10-CM | POA: Diagnosis not present

## 2016-12-27 DIAGNOSIS — N3001 Acute cystitis with hematuria: Secondary | ICD-10-CM | POA: Diagnosis not present

## 2016-12-30 DIAGNOSIS — M1991 Primary osteoarthritis, unspecified site: Secondary | ICD-10-CM | POA: Diagnosis not present

## 2016-12-30 DIAGNOSIS — I10 Essential (primary) hypertension: Secondary | ICD-10-CM | POA: Diagnosis not present

## 2016-12-30 DIAGNOSIS — I341 Nonrheumatic mitral (valve) prolapse: Secondary | ICD-10-CM | POA: Diagnosis not present

## 2016-12-30 DIAGNOSIS — M84454D Pathological fracture, pelvis, subsequent encounter for fracture with routine healing: Secondary | ICD-10-CM | POA: Diagnosis not present

## 2016-12-30 DIAGNOSIS — N3001 Acute cystitis with hematuria: Secondary | ICD-10-CM | POA: Diagnosis not present

## 2016-12-30 DIAGNOSIS — N12 Tubulo-interstitial nephritis, not specified as acute or chronic: Secondary | ICD-10-CM | POA: Diagnosis not present

## 2017-01-02 DIAGNOSIS — N12 Tubulo-interstitial nephritis, not specified as acute or chronic: Secondary | ICD-10-CM | POA: Diagnosis not present

## 2017-01-02 DIAGNOSIS — N3001 Acute cystitis with hematuria: Secondary | ICD-10-CM | POA: Diagnosis not present

## 2017-01-02 DIAGNOSIS — I10 Essential (primary) hypertension: Secondary | ICD-10-CM | POA: Diagnosis not present

## 2017-01-02 DIAGNOSIS — I341 Nonrheumatic mitral (valve) prolapse: Secondary | ICD-10-CM | POA: Diagnosis not present

## 2017-01-02 DIAGNOSIS — M84454D Pathological fracture, pelvis, subsequent encounter for fracture with routine healing: Secondary | ICD-10-CM | POA: Diagnosis not present

## 2017-01-02 DIAGNOSIS — M1991 Primary osteoarthritis, unspecified site: Secondary | ICD-10-CM | POA: Diagnosis not present

## 2017-01-03 DIAGNOSIS — D531 Other megaloblastic anemias, not elsewhere classified: Secondary | ICD-10-CM | POA: Diagnosis not present

## 2017-01-03 DIAGNOSIS — N3001 Acute cystitis with hematuria: Secondary | ICD-10-CM | POA: Diagnosis not present

## 2017-01-03 DIAGNOSIS — S32592A Other specified fracture of left pubis, initial encounter for closed fracture: Secondary | ICD-10-CM | POA: Diagnosis not present

## 2017-01-06 DIAGNOSIS — M84454D Pathological fracture, pelvis, subsequent encounter for fracture with routine healing: Secondary | ICD-10-CM | POA: Diagnosis not present

## 2017-01-06 DIAGNOSIS — I341 Nonrheumatic mitral (valve) prolapse: Secondary | ICD-10-CM | POA: Diagnosis not present

## 2017-01-06 DIAGNOSIS — N3001 Acute cystitis with hematuria: Secondary | ICD-10-CM | POA: Diagnosis not present

## 2017-01-06 DIAGNOSIS — M1991 Primary osteoarthritis, unspecified site: Secondary | ICD-10-CM | POA: Diagnosis not present

## 2017-01-06 DIAGNOSIS — I10 Essential (primary) hypertension: Secondary | ICD-10-CM | POA: Diagnosis not present

## 2017-01-06 DIAGNOSIS — N12 Tubulo-interstitial nephritis, not specified as acute or chronic: Secondary | ICD-10-CM | POA: Diagnosis not present

## 2017-01-07 DIAGNOSIS — M1991 Primary osteoarthritis, unspecified site: Secondary | ICD-10-CM | POA: Diagnosis not present

## 2017-01-07 DIAGNOSIS — I10 Essential (primary) hypertension: Secondary | ICD-10-CM | POA: Diagnosis not present

## 2017-01-07 DIAGNOSIS — N3001 Acute cystitis with hematuria: Secondary | ICD-10-CM | POA: Diagnosis not present

## 2017-01-07 DIAGNOSIS — I341 Nonrheumatic mitral (valve) prolapse: Secondary | ICD-10-CM | POA: Diagnosis not present

## 2017-01-07 DIAGNOSIS — N12 Tubulo-interstitial nephritis, not specified as acute or chronic: Secondary | ICD-10-CM | POA: Diagnosis not present

## 2017-01-07 DIAGNOSIS — M84454D Pathological fracture, pelvis, subsequent encounter for fracture with routine healing: Secondary | ICD-10-CM | POA: Diagnosis not present

## 2017-01-08 DIAGNOSIS — N12 Tubulo-interstitial nephritis, not specified as acute or chronic: Secondary | ICD-10-CM | POA: Diagnosis not present

## 2017-01-08 DIAGNOSIS — M84454D Pathological fracture, pelvis, subsequent encounter for fracture with routine healing: Secondary | ICD-10-CM | POA: Diagnosis not present

## 2017-01-08 DIAGNOSIS — M1991 Primary osteoarthritis, unspecified site: Secondary | ICD-10-CM | POA: Diagnosis not present

## 2017-01-08 DIAGNOSIS — N3001 Acute cystitis with hematuria: Secondary | ICD-10-CM | POA: Diagnosis not present

## 2017-01-08 DIAGNOSIS — I10 Essential (primary) hypertension: Secondary | ICD-10-CM | POA: Diagnosis not present

## 2017-01-08 DIAGNOSIS — I341 Nonrheumatic mitral (valve) prolapse: Secondary | ICD-10-CM | POA: Diagnosis not present

## 2017-01-14 DIAGNOSIS — R419 Unspecified symptoms and signs involving cognitive functions and awareness: Secondary | ICD-10-CM | POA: Diagnosis not present

## 2017-01-14 DIAGNOSIS — M84350G Stress fracture, pelvis, subsequent encounter for fracture with delayed healing: Secondary | ICD-10-CM | POA: Diagnosis not present

## 2017-01-14 DIAGNOSIS — R413 Other amnesia: Secondary | ICD-10-CM | POA: Diagnosis not present

## 2017-01-14 DIAGNOSIS — M6281 Muscle weakness (generalized): Secondary | ICD-10-CM | POA: Diagnosis not present

## 2017-01-14 DIAGNOSIS — R2689 Other abnormalities of gait and mobility: Secondary | ICD-10-CM | POA: Diagnosis not present

## 2017-01-14 DIAGNOSIS — M25552 Pain in left hip: Secondary | ICD-10-CM | POA: Diagnosis not present

## 2017-01-15 DIAGNOSIS — R233 Spontaneous ecchymoses: Secondary | ICD-10-CM | POA: Diagnosis not present

## 2017-01-15 DIAGNOSIS — N200 Calculus of kidney: Secondary | ICD-10-CM | POA: Diagnosis not present

## 2017-01-15 DIAGNOSIS — M84454S Pathological fracture, pelvis, sequela: Secondary | ICD-10-CM | POA: Diagnosis not present

## 2017-01-15 DIAGNOSIS — R945 Abnormal results of liver function studies: Secondary | ICD-10-CM | POA: Diagnosis not present

## 2017-01-15 DIAGNOSIS — N3001 Acute cystitis with hematuria: Secondary | ICD-10-CM | POA: Diagnosis not present

## 2017-01-15 DIAGNOSIS — E785 Hyperlipidemia, unspecified: Secondary | ICD-10-CM | POA: Diagnosis not present

## 2017-01-20 DIAGNOSIS — N3020 Other chronic cystitis without hematuria: Secondary | ICD-10-CM | POA: Diagnosis not present

## 2017-01-21 DIAGNOSIS — S32592A Other specified fracture of left pubis, initial encounter for closed fracture: Secondary | ICD-10-CM | POA: Diagnosis not present

## 2017-01-21 DIAGNOSIS — M6281 Muscle weakness (generalized): Secondary | ICD-10-CM | POA: Diagnosis not present

## 2017-01-21 DIAGNOSIS — M25552 Pain in left hip: Secondary | ICD-10-CM | POA: Diagnosis not present

## 2017-01-21 DIAGNOSIS — R2689 Other abnormalities of gait and mobility: Secondary | ICD-10-CM | POA: Diagnosis not present

## 2017-01-21 DIAGNOSIS — M84350G Stress fracture, pelvis, subsequent encounter for fracture with delayed healing: Secondary | ICD-10-CM | POA: Diagnosis not present

## 2017-01-21 DIAGNOSIS — R419 Unspecified symptoms and signs involving cognitive functions and awareness: Secondary | ICD-10-CM | POA: Diagnosis not present

## 2017-01-21 DIAGNOSIS — R413 Other amnesia: Secondary | ICD-10-CM | POA: Diagnosis not present

## 2017-01-23 DIAGNOSIS — M6281 Muscle weakness (generalized): Secondary | ICD-10-CM | POA: Diagnosis not present

## 2017-01-23 DIAGNOSIS — M84350G Stress fracture, pelvis, subsequent encounter for fracture with delayed healing: Secondary | ICD-10-CM | POA: Diagnosis not present

## 2017-01-23 DIAGNOSIS — M25552 Pain in left hip: Secondary | ICD-10-CM | POA: Diagnosis not present

## 2017-01-23 DIAGNOSIS — R922 Inconclusive mammogram: Secondary | ICD-10-CM | POA: Diagnosis not present

## 2017-01-23 DIAGNOSIS — R413 Other amnesia: Secondary | ICD-10-CM | POA: Diagnosis not present

## 2017-01-23 DIAGNOSIS — N6489 Other specified disorders of breast: Secondary | ICD-10-CM | POA: Diagnosis not present

## 2017-01-23 DIAGNOSIS — R419 Unspecified symptoms and signs involving cognitive functions and awareness: Secondary | ICD-10-CM | POA: Diagnosis not present

## 2017-01-23 DIAGNOSIS — R2689 Other abnormalities of gait and mobility: Secondary | ICD-10-CM | POA: Diagnosis not present

## 2017-01-23 DIAGNOSIS — N6321 Unspecified lump in the left breast, upper outer quadrant: Secondary | ICD-10-CM | POA: Diagnosis not present

## 2017-01-28 DIAGNOSIS — R413 Other amnesia: Secondary | ICD-10-CM | POA: Diagnosis not present

## 2017-01-28 DIAGNOSIS — R2689 Other abnormalities of gait and mobility: Secondary | ICD-10-CM | POA: Diagnosis not present

## 2017-01-28 DIAGNOSIS — M84350G Stress fracture, pelvis, subsequent encounter for fracture with delayed healing: Secondary | ICD-10-CM | POA: Diagnosis not present

## 2017-01-28 DIAGNOSIS — N200 Calculus of kidney: Secondary | ICD-10-CM | POA: Diagnosis not present

## 2017-01-28 DIAGNOSIS — M25552 Pain in left hip: Secondary | ICD-10-CM | POA: Diagnosis not present

## 2017-01-28 DIAGNOSIS — M6281 Muscle weakness (generalized): Secondary | ICD-10-CM | POA: Diagnosis not present

## 2017-01-28 DIAGNOSIS — R419 Unspecified symptoms and signs involving cognitive functions and awareness: Secondary | ICD-10-CM | POA: Diagnosis not present

## 2017-01-29 DIAGNOSIS — M4716 Other spondylosis with myelopathy, lumbar region: Secondary | ICD-10-CM | POA: Diagnosis not present

## 2017-01-29 DIAGNOSIS — G894 Chronic pain syndrome: Secondary | ICD-10-CM | POA: Diagnosis not present

## 2017-01-29 DIAGNOSIS — M791 Myalgia: Secondary | ICD-10-CM | POA: Diagnosis not present

## 2017-01-29 DIAGNOSIS — M7071 Other bursitis of hip, right hip: Secondary | ICD-10-CM | POA: Diagnosis not present

## 2017-01-30 DIAGNOSIS — M25552 Pain in left hip: Secondary | ICD-10-CM | POA: Diagnosis not present

## 2017-01-30 DIAGNOSIS — R419 Unspecified symptoms and signs involving cognitive functions and awareness: Secondary | ICD-10-CM | POA: Diagnosis not present

## 2017-01-30 DIAGNOSIS — R2689 Other abnormalities of gait and mobility: Secondary | ICD-10-CM | POA: Diagnosis not present

## 2017-01-30 DIAGNOSIS — M6281 Muscle weakness (generalized): Secondary | ICD-10-CM | POA: Diagnosis not present

## 2017-01-30 DIAGNOSIS — M84350G Stress fracture, pelvis, subsequent encounter for fracture with delayed healing: Secondary | ICD-10-CM | POA: Diagnosis not present

## 2017-01-30 DIAGNOSIS — R413 Other amnesia: Secondary | ICD-10-CM | POA: Diagnosis not present

## 2017-02-04 DIAGNOSIS — M6281 Muscle weakness (generalized): Secondary | ICD-10-CM | POA: Diagnosis not present

## 2017-02-04 DIAGNOSIS — M25552 Pain in left hip: Secondary | ICD-10-CM | POA: Diagnosis not present

## 2017-02-04 DIAGNOSIS — G3184 Mild cognitive impairment, so stated: Secondary | ICD-10-CM | POA: Diagnosis not present

## 2017-02-04 DIAGNOSIS — M84350G Stress fracture, pelvis, subsequent encounter for fracture with delayed healing: Secondary | ICD-10-CM | POA: Diagnosis not present

## 2017-02-04 DIAGNOSIS — R413 Other amnesia: Secondary | ICD-10-CM | POA: Diagnosis not present

## 2017-02-04 DIAGNOSIS — R2689 Other abnormalities of gait and mobility: Secondary | ICD-10-CM | POA: Diagnosis not present

## 2017-02-04 DIAGNOSIS — R419 Unspecified symptoms and signs involving cognitive functions and awareness: Secondary | ICD-10-CM | POA: Diagnosis not present

## 2017-02-06 DIAGNOSIS — M25552 Pain in left hip: Secondary | ICD-10-CM | POA: Diagnosis not present

## 2017-02-06 DIAGNOSIS — M6281 Muscle weakness (generalized): Secondary | ICD-10-CM | POA: Diagnosis not present

## 2017-02-06 DIAGNOSIS — R2689 Other abnormalities of gait and mobility: Secondary | ICD-10-CM | POA: Diagnosis not present

## 2017-02-06 DIAGNOSIS — M84350G Stress fracture, pelvis, subsequent encounter for fracture with delayed healing: Secondary | ICD-10-CM | POA: Diagnosis not present

## 2017-02-06 DIAGNOSIS — R413 Other amnesia: Secondary | ICD-10-CM | POA: Diagnosis not present

## 2017-02-06 DIAGNOSIS — R419 Unspecified symptoms and signs involving cognitive functions and awareness: Secondary | ICD-10-CM | POA: Diagnosis not present

## 2017-02-11 DIAGNOSIS — R2689 Other abnormalities of gait and mobility: Secondary | ICD-10-CM | POA: Diagnosis not present

## 2017-02-11 DIAGNOSIS — M25552 Pain in left hip: Secondary | ICD-10-CM | POA: Diagnosis not present

## 2017-02-11 DIAGNOSIS — R419 Unspecified symptoms and signs involving cognitive functions and awareness: Secondary | ICD-10-CM | POA: Diagnosis not present

## 2017-02-11 DIAGNOSIS — M6281 Muscle weakness (generalized): Secondary | ICD-10-CM | POA: Diagnosis not present

## 2017-02-11 DIAGNOSIS — M84350G Stress fracture, pelvis, subsequent encounter for fracture with delayed healing: Secondary | ICD-10-CM | POA: Diagnosis not present

## 2017-02-11 DIAGNOSIS — R413 Other amnesia: Secondary | ICD-10-CM | POA: Diagnosis not present

## 2017-02-13 DIAGNOSIS — M6281 Muscle weakness (generalized): Secondary | ICD-10-CM | POA: Diagnosis not present

## 2017-02-13 DIAGNOSIS — R419 Unspecified symptoms and signs involving cognitive functions and awareness: Secondary | ICD-10-CM | POA: Diagnosis not present

## 2017-02-13 DIAGNOSIS — R2689 Other abnormalities of gait and mobility: Secondary | ICD-10-CM | POA: Diagnosis not present

## 2017-02-13 DIAGNOSIS — R413 Other amnesia: Secondary | ICD-10-CM | POA: Diagnosis not present

## 2017-02-13 DIAGNOSIS — M84350G Stress fracture, pelvis, subsequent encounter for fracture with delayed healing: Secondary | ICD-10-CM | POA: Diagnosis not present

## 2017-02-13 DIAGNOSIS — M25552 Pain in left hip: Secondary | ICD-10-CM | POA: Diagnosis not present

## 2017-02-18 DIAGNOSIS — R419 Unspecified symptoms and signs involving cognitive functions and awareness: Secondary | ICD-10-CM | POA: Diagnosis not present

## 2017-02-18 DIAGNOSIS — M6281 Muscle weakness (generalized): Secondary | ICD-10-CM | POA: Diagnosis not present

## 2017-02-18 DIAGNOSIS — R413 Other amnesia: Secondary | ICD-10-CM | POA: Diagnosis not present

## 2017-02-18 DIAGNOSIS — M25552 Pain in left hip: Secondary | ICD-10-CM | POA: Diagnosis not present

## 2017-02-18 DIAGNOSIS — M84350G Stress fracture, pelvis, subsequent encounter for fracture with delayed healing: Secondary | ICD-10-CM | POA: Diagnosis not present

## 2017-02-18 DIAGNOSIS — R2689 Other abnormalities of gait and mobility: Secondary | ICD-10-CM | POA: Diagnosis not present

## 2017-02-20 DIAGNOSIS — M25552 Pain in left hip: Secondary | ICD-10-CM | POA: Diagnosis not present

## 2017-02-20 DIAGNOSIS — M84350G Stress fracture, pelvis, subsequent encounter for fracture with delayed healing: Secondary | ICD-10-CM | POA: Diagnosis not present

## 2017-02-20 DIAGNOSIS — R419 Unspecified symptoms and signs involving cognitive functions and awareness: Secondary | ICD-10-CM | POA: Diagnosis not present

## 2017-02-20 DIAGNOSIS — R413 Other amnesia: Secondary | ICD-10-CM | POA: Diagnosis not present

## 2017-02-20 DIAGNOSIS — M6281 Muscle weakness (generalized): Secondary | ICD-10-CM | POA: Diagnosis not present

## 2017-02-20 DIAGNOSIS — R2689 Other abnormalities of gait and mobility: Secondary | ICD-10-CM | POA: Diagnosis not present

## 2017-02-25 DIAGNOSIS — R419 Unspecified symptoms and signs involving cognitive functions and awareness: Secondary | ICD-10-CM | POA: Diagnosis not present

## 2017-02-25 DIAGNOSIS — M6281 Muscle weakness (generalized): Secondary | ICD-10-CM | POA: Diagnosis not present

## 2017-02-25 DIAGNOSIS — R2689 Other abnormalities of gait and mobility: Secondary | ICD-10-CM | POA: Diagnosis not present

## 2017-02-25 DIAGNOSIS — R413 Other amnesia: Secondary | ICD-10-CM | POA: Diagnosis not present

## 2017-02-25 DIAGNOSIS — M25552 Pain in left hip: Secondary | ICD-10-CM | POA: Diagnosis not present

## 2017-02-25 DIAGNOSIS — M84350G Stress fracture, pelvis, subsequent encounter for fracture with delayed healing: Secondary | ICD-10-CM | POA: Diagnosis not present

## 2017-02-26 DIAGNOSIS — K222 Esophageal obstruction: Secondary | ICD-10-CM | POA: Diagnosis not present

## 2017-02-26 DIAGNOSIS — K219 Gastro-esophageal reflux disease without esophagitis: Secondary | ICD-10-CM | POA: Diagnosis not present

## 2017-02-27 DIAGNOSIS — R413 Other amnesia: Secondary | ICD-10-CM | POA: Diagnosis not present

## 2017-02-27 DIAGNOSIS — M6281 Muscle weakness (generalized): Secondary | ICD-10-CM | POA: Diagnosis not present

## 2017-02-27 DIAGNOSIS — M25552 Pain in left hip: Secondary | ICD-10-CM | POA: Diagnosis not present

## 2017-02-27 DIAGNOSIS — M84350G Stress fracture, pelvis, subsequent encounter for fracture with delayed healing: Secondary | ICD-10-CM | POA: Diagnosis not present

## 2017-02-27 DIAGNOSIS — R419 Unspecified symptoms and signs involving cognitive functions and awareness: Secondary | ICD-10-CM | POA: Diagnosis not present

## 2017-02-27 DIAGNOSIS — R2689 Other abnormalities of gait and mobility: Secondary | ICD-10-CM | POA: Diagnosis not present

## 2017-03-03 DIAGNOSIS — R2689 Other abnormalities of gait and mobility: Secondary | ICD-10-CM | POA: Diagnosis not present

## 2017-03-03 DIAGNOSIS — M25552 Pain in left hip: Secondary | ICD-10-CM | POA: Diagnosis not present

## 2017-03-03 DIAGNOSIS — M6281 Muscle weakness (generalized): Secondary | ICD-10-CM | POA: Diagnosis not present

## 2017-03-03 DIAGNOSIS — M84350G Stress fracture, pelvis, subsequent encounter for fracture with delayed healing: Secondary | ICD-10-CM | POA: Diagnosis not present

## 2017-03-03 DIAGNOSIS — R413 Other amnesia: Secondary | ICD-10-CM | POA: Diagnosis not present

## 2017-03-03 DIAGNOSIS — R419 Unspecified symptoms and signs involving cognitive functions and awareness: Secondary | ICD-10-CM | POA: Diagnosis not present

## 2017-03-04 DIAGNOSIS — S32592A Other specified fracture of left pubis, initial encounter for closed fracture: Secondary | ICD-10-CM | POA: Diagnosis not present

## 2017-03-06 DIAGNOSIS — S3992XA Unspecified injury of lower back, initial encounter: Secondary | ICD-10-CM | POA: Diagnosis not present

## 2017-03-06 DIAGNOSIS — M25551 Pain in right hip: Secondary | ICD-10-CM | POA: Diagnosis not present

## 2017-03-06 DIAGNOSIS — Z6831 Body mass index (BMI) 31.0-31.9, adult: Secondary | ICD-10-CM | POA: Diagnosis not present

## 2017-03-06 DIAGNOSIS — Z8781 Personal history of (healed) traumatic fracture: Secondary | ICD-10-CM | POA: Diagnosis not present

## 2017-03-06 DIAGNOSIS — M419 Scoliosis, unspecified: Secondary | ICD-10-CM | POA: Diagnosis not present

## 2017-03-11 DIAGNOSIS — M5441 Lumbago with sciatica, right side: Secondary | ICD-10-CM | POA: Diagnosis not present

## 2017-03-11 DIAGNOSIS — M25552 Pain in left hip: Secondary | ICD-10-CM | POA: Diagnosis not present

## 2017-03-11 DIAGNOSIS — R2689 Other abnormalities of gait and mobility: Secondary | ICD-10-CM | POA: Diagnosis not present

## 2017-03-11 DIAGNOSIS — M84350G Stress fracture, pelvis, subsequent encounter for fracture with delayed healing: Secondary | ICD-10-CM | POA: Diagnosis not present

## 2017-03-11 DIAGNOSIS — M6281 Muscle weakness (generalized): Secondary | ICD-10-CM | POA: Diagnosis not present

## 2017-03-11 DIAGNOSIS — R413 Other amnesia: Secondary | ICD-10-CM | POA: Diagnosis not present

## 2017-03-11 DIAGNOSIS — R419 Unspecified symptoms and signs involving cognitive functions and awareness: Secondary | ICD-10-CM | POA: Diagnosis not present

## 2017-03-11 DIAGNOSIS — M2560 Stiffness of unspecified joint, not elsewhere classified: Secondary | ICD-10-CM | POA: Diagnosis not present

## 2017-03-13 DIAGNOSIS — R413 Other amnesia: Secondary | ICD-10-CM | POA: Diagnosis not present

## 2017-03-13 DIAGNOSIS — R2689 Other abnormalities of gait and mobility: Secondary | ICD-10-CM | POA: Diagnosis not present

## 2017-03-13 DIAGNOSIS — M6281 Muscle weakness (generalized): Secondary | ICD-10-CM | POA: Diagnosis not present

## 2017-03-13 DIAGNOSIS — M84350G Stress fracture, pelvis, subsequent encounter for fracture with delayed healing: Secondary | ICD-10-CM | POA: Diagnosis not present

## 2017-03-13 DIAGNOSIS — M25552 Pain in left hip: Secondary | ICD-10-CM | POA: Diagnosis not present

## 2017-03-13 DIAGNOSIS — M5441 Lumbago with sciatica, right side: Secondary | ICD-10-CM | POA: Diagnosis not present

## 2017-03-18 DIAGNOSIS — N390 Urinary tract infection, site not specified: Secondary | ICD-10-CM | POA: Diagnosis not present

## 2017-03-18 DIAGNOSIS — M25552 Pain in left hip: Secondary | ICD-10-CM | POA: Diagnosis not present

## 2017-03-18 DIAGNOSIS — M5441 Lumbago with sciatica, right side: Secondary | ICD-10-CM | POA: Diagnosis not present

## 2017-03-18 DIAGNOSIS — R413 Other amnesia: Secondary | ICD-10-CM | POA: Diagnosis not present

## 2017-03-18 DIAGNOSIS — R2689 Other abnormalities of gait and mobility: Secondary | ICD-10-CM | POA: Diagnosis not present

## 2017-03-18 DIAGNOSIS — M6281 Muscle weakness (generalized): Secondary | ICD-10-CM | POA: Diagnosis not present

## 2017-03-18 DIAGNOSIS — M84350G Stress fracture, pelvis, subsequent encounter for fracture with delayed healing: Secondary | ICD-10-CM | POA: Diagnosis not present

## 2017-03-20 DIAGNOSIS — R413 Other amnesia: Secondary | ICD-10-CM | POA: Diagnosis not present

## 2017-03-20 DIAGNOSIS — M84350G Stress fracture, pelvis, subsequent encounter for fracture with delayed healing: Secondary | ICD-10-CM | POA: Diagnosis not present

## 2017-03-20 DIAGNOSIS — M25552 Pain in left hip: Secondary | ICD-10-CM | POA: Diagnosis not present

## 2017-03-20 DIAGNOSIS — R2689 Other abnormalities of gait and mobility: Secondary | ICD-10-CM | POA: Diagnosis not present

## 2017-03-20 DIAGNOSIS — M5441 Lumbago with sciatica, right side: Secondary | ICD-10-CM | POA: Diagnosis not present

## 2017-03-20 DIAGNOSIS — M6281 Muscle weakness (generalized): Secondary | ICD-10-CM | POA: Diagnosis not present

## 2017-03-21 DIAGNOSIS — R600 Localized edema: Secondary | ICD-10-CM | POA: Diagnosis not present

## 2017-03-21 DIAGNOSIS — M7989 Other specified soft tissue disorders: Secondary | ICD-10-CM | POA: Diagnosis not present

## 2017-03-21 DIAGNOSIS — M79605 Pain in left leg: Secondary | ICD-10-CM | POA: Diagnosis not present

## 2017-03-25 DIAGNOSIS — M5441 Lumbago with sciatica, right side: Secondary | ICD-10-CM | POA: Diagnosis not present

## 2017-03-25 DIAGNOSIS — M25552 Pain in left hip: Secondary | ICD-10-CM | POA: Diagnosis not present

## 2017-03-25 DIAGNOSIS — M84350G Stress fracture, pelvis, subsequent encounter for fracture with delayed healing: Secondary | ICD-10-CM | POA: Diagnosis not present

## 2017-03-25 DIAGNOSIS — R2689 Other abnormalities of gait and mobility: Secondary | ICD-10-CM | POA: Diagnosis not present

## 2017-03-25 DIAGNOSIS — M6281 Muscle weakness (generalized): Secondary | ICD-10-CM | POA: Diagnosis not present

## 2017-03-25 DIAGNOSIS — R413 Other amnesia: Secondary | ICD-10-CM | POA: Diagnosis not present

## 2017-04-01 DIAGNOSIS — M5441 Lumbago with sciatica, right side: Secondary | ICD-10-CM | POA: Diagnosis not present

## 2017-04-01 DIAGNOSIS — R2689 Other abnormalities of gait and mobility: Secondary | ICD-10-CM | POA: Diagnosis not present

## 2017-04-01 DIAGNOSIS — M84350G Stress fracture, pelvis, subsequent encounter for fracture with delayed healing: Secondary | ICD-10-CM | POA: Diagnosis not present

## 2017-04-01 DIAGNOSIS — M6281 Muscle weakness (generalized): Secondary | ICD-10-CM | POA: Diagnosis not present

## 2017-04-01 DIAGNOSIS — M25552 Pain in left hip: Secondary | ICD-10-CM | POA: Diagnosis not present

## 2017-04-01 DIAGNOSIS — R413 Other amnesia: Secondary | ICD-10-CM | POA: Diagnosis not present

## 2017-04-04 DIAGNOSIS — M6281 Muscle weakness (generalized): Secondary | ICD-10-CM | POA: Diagnosis not present

## 2017-04-04 DIAGNOSIS — M84350G Stress fracture, pelvis, subsequent encounter for fracture with delayed healing: Secondary | ICD-10-CM | POA: Diagnosis not present

## 2017-04-04 DIAGNOSIS — M5441 Lumbago with sciatica, right side: Secondary | ICD-10-CM | POA: Diagnosis not present

## 2017-04-04 DIAGNOSIS — R2689 Other abnormalities of gait and mobility: Secondary | ICD-10-CM | POA: Diagnosis not present

## 2017-04-04 DIAGNOSIS — R413 Other amnesia: Secondary | ICD-10-CM | POA: Diagnosis not present

## 2017-04-04 DIAGNOSIS — M25552 Pain in left hip: Secondary | ICD-10-CM | POA: Diagnosis not present

## 2017-04-05 DIAGNOSIS — M5126 Other intervertebral disc displacement, lumbar region: Secondary | ICD-10-CM | POA: Diagnosis not present

## 2017-04-05 DIAGNOSIS — M25551 Pain in right hip: Secondary | ICD-10-CM | POA: Diagnosis not present

## 2017-04-05 DIAGNOSIS — M419 Scoliosis, unspecified: Secondary | ICD-10-CM | POA: Diagnosis not present

## 2017-04-15 DIAGNOSIS — S32592A Other specified fracture of left pubis, initial encounter for closed fracture: Secondary | ICD-10-CM | POA: Diagnosis not present

## 2017-04-24 DIAGNOSIS — R030 Elevated blood-pressure reading, without diagnosis of hypertension: Secondary | ICD-10-CM | POA: Diagnosis not present

## 2017-04-24 DIAGNOSIS — M5416 Radiculopathy, lumbar region: Secondary | ICD-10-CM | POA: Diagnosis not present

## 2017-04-24 DIAGNOSIS — N390 Urinary tract infection, site not specified: Secondary | ICD-10-CM | POA: Diagnosis not present

## 2017-04-24 DIAGNOSIS — Z6831 Body mass index (BMI) 31.0-31.9, adult: Secondary | ICD-10-CM | POA: Diagnosis not present

## 2017-04-24 DIAGNOSIS — Z1231 Encounter for screening mammogram for malignant neoplasm of breast: Secondary | ICD-10-CM | POA: Diagnosis not present

## 2017-04-24 DIAGNOSIS — Z01419 Encounter for gynecological examination (general) (routine) without abnormal findings: Secondary | ICD-10-CM | POA: Diagnosis not present

## 2017-04-24 DIAGNOSIS — Z6832 Body mass index (BMI) 32.0-32.9, adult: Secondary | ICD-10-CM | POA: Diagnosis not present

## 2017-05-22 DIAGNOSIS — R030 Elevated blood-pressure reading, without diagnosis of hypertension: Secondary | ICD-10-CM | POA: Diagnosis not present

## 2017-05-22 DIAGNOSIS — Z6831 Body mass index (BMI) 31.0-31.9, adult: Secondary | ICD-10-CM | POA: Diagnosis not present

## 2017-05-22 DIAGNOSIS — M48062 Spinal stenosis, lumbar region with neurogenic claudication: Secondary | ICD-10-CM | POA: Diagnosis not present

## 2017-05-22 DIAGNOSIS — M461 Sacroiliitis, not elsewhere classified: Secondary | ICD-10-CM | POA: Diagnosis not present

## 2017-05-22 DIAGNOSIS — M5416 Radiculopathy, lumbar region: Secondary | ICD-10-CM | POA: Diagnosis not present

## 2017-06-04 DIAGNOSIS — M5416 Radiculopathy, lumbar region: Secondary | ICD-10-CM | POA: Diagnosis not present

## 2017-06-04 DIAGNOSIS — M48062 Spinal stenosis, lumbar region with neurogenic claudication: Secondary | ICD-10-CM | POA: Diagnosis not present

## 2017-06-04 DIAGNOSIS — Z6831 Body mass index (BMI) 31.0-31.9, adult: Secondary | ICD-10-CM | POA: Diagnosis not present

## 2017-06-04 DIAGNOSIS — R030 Elevated blood-pressure reading, without diagnosis of hypertension: Secondary | ICD-10-CM | POA: Diagnosis not present

## 2017-07-15 DIAGNOSIS — S32592A Other specified fracture of left pubis, initial encounter for closed fracture: Secondary | ICD-10-CM | POA: Diagnosis not present

## 2017-07-22 DIAGNOSIS — M25852 Other specified joint disorders, left hip: Secondary | ICD-10-CM | POA: Diagnosis not present

## 2017-07-22 DIAGNOSIS — M1612 Unilateral primary osteoarthritis, left hip: Secondary | ICD-10-CM | POA: Diagnosis not present

## 2017-07-23 DIAGNOSIS — R2689 Other abnormalities of gait and mobility: Secondary | ICD-10-CM | POA: Diagnosis not present

## 2017-07-23 DIAGNOSIS — M6281 Muscle weakness (generalized): Secondary | ICD-10-CM | POA: Diagnosis not present

## 2017-07-23 DIAGNOSIS — M25551 Pain in right hip: Secondary | ICD-10-CM | POA: Diagnosis not present

## 2017-07-23 DIAGNOSIS — S32592D Other specified fracture of left pubis, subsequent encounter for fracture with routine healing: Secondary | ICD-10-CM | POA: Diagnosis not present

## 2017-07-24 DIAGNOSIS — M6281 Muscle weakness (generalized): Secondary | ICD-10-CM | POA: Diagnosis not present

## 2017-07-24 DIAGNOSIS — S32592D Other specified fracture of left pubis, subsequent encounter for fracture with routine healing: Secondary | ICD-10-CM | POA: Diagnosis not present

## 2017-07-24 DIAGNOSIS — M25552 Pain in left hip: Secondary | ICD-10-CM | POA: Diagnosis not present

## 2017-07-24 DIAGNOSIS — R2689 Other abnormalities of gait and mobility: Secondary | ICD-10-CM | POA: Diagnosis not present

## 2017-07-29 DIAGNOSIS — M545 Low back pain: Secondary | ICD-10-CM | POA: Diagnosis not present

## 2017-07-29 DIAGNOSIS — R2689 Other abnormalities of gait and mobility: Secondary | ICD-10-CM | POA: Diagnosis not present

## 2017-07-29 DIAGNOSIS — M25552 Pain in left hip: Secondary | ICD-10-CM | POA: Diagnosis not present

## 2017-07-29 DIAGNOSIS — M6281 Muscle weakness (generalized): Secondary | ICD-10-CM | POA: Diagnosis not present

## 2017-07-29 DIAGNOSIS — S32592D Other specified fracture of left pubis, subsequent encounter for fracture with routine healing: Secondary | ICD-10-CM | POA: Diagnosis not present

## 2017-07-29 DIAGNOSIS — G894 Chronic pain syndrome: Secondary | ICD-10-CM | POA: Diagnosis not present

## 2017-07-30 DIAGNOSIS — H2513 Age-related nuclear cataract, bilateral: Secondary | ICD-10-CM | POA: Diagnosis not present

## 2017-07-30 DIAGNOSIS — H16223 Keratoconjunctivitis sicca, not specified as Sjogren's, bilateral: Secondary | ICD-10-CM | POA: Diagnosis not present

## 2017-07-31 DIAGNOSIS — S32592D Other specified fracture of left pubis, subsequent encounter for fracture with routine healing: Secondary | ICD-10-CM | POA: Diagnosis not present

## 2017-07-31 DIAGNOSIS — M6281 Muscle weakness (generalized): Secondary | ICD-10-CM | POA: Diagnosis not present

## 2017-07-31 DIAGNOSIS — R2689 Other abnormalities of gait and mobility: Secondary | ICD-10-CM | POA: Diagnosis not present

## 2017-07-31 DIAGNOSIS — M25552 Pain in left hip: Secondary | ICD-10-CM | POA: Diagnosis not present

## 2017-08-06 DIAGNOSIS — R2689 Other abnormalities of gait and mobility: Secondary | ICD-10-CM | POA: Diagnosis not present

## 2017-08-06 DIAGNOSIS — M6281 Muscle weakness (generalized): Secondary | ICD-10-CM | POA: Diagnosis not present

## 2017-08-06 DIAGNOSIS — S32592D Other specified fracture of left pubis, subsequent encounter for fracture with routine healing: Secondary | ICD-10-CM | POA: Diagnosis not present

## 2017-08-06 DIAGNOSIS — M25552 Pain in left hip: Secondary | ICD-10-CM | POA: Diagnosis not present

## 2017-08-08 DIAGNOSIS — R2689 Other abnormalities of gait and mobility: Secondary | ICD-10-CM | POA: Diagnosis not present

## 2017-08-08 DIAGNOSIS — M25552 Pain in left hip: Secondary | ICD-10-CM | POA: Diagnosis not present

## 2017-08-08 DIAGNOSIS — S32592D Other specified fracture of left pubis, subsequent encounter for fracture with routine healing: Secondary | ICD-10-CM | POA: Diagnosis not present

## 2017-08-08 DIAGNOSIS — M6281 Muscle weakness (generalized): Secondary | ICD-10-CM | POA: Diagnosis not present

## 2017-08-14 DIAGNOSIS — R2689 Other abnormalities of gait and mobility: Secondary | ICD-10-CM | POA: Diagnosis not present

## 2017-08-14 DIAGNOSIS — S32592D Other specified fracture of left pubis, subsequent encounter for fracture with routine healing: Secondary | ICD-10-CM | POA: Diagnosis not present

## 2017-08-14 DIAGNOSIS — M6281 Muscle weakness (generalized): Secondary | ICD-10-CM | POA: Diagnosis not present

## 2017-08-14 DIAGNOSIS — M25552 Pain in left hip: Secondary | ICD-10-CM | POA: Diagnosis not present

## 2017-08-19 DIAGNOSIS — M6281 Muscle weakness (generalized): Secondary | ICD-10-CM | POA: Diagnosis not present

## 2017-08-19 DIAGNOSIS — S32592D Other specified fracture of left pubis, subsequent encounter for fracture with routine healing: Secondary | ICD-10-CM | POA: Diagnosis not present

## 2017-08-19 DIAGNOSIS — R2689 Other abnormalities of gait and mobility: Secondary | ICD-10-CM | POA: Diagnosis not present

## 2017-08-19 DIAGNOSIS — M25552 Pain in left hip: Secondary | ICD-10-CM | POA: Diagnosis not present

## 2017-08-26 DIAGNOSIS — S32592D Other specified fracture of left pubis, subsequent encounter for fracture with routine healing: Secondary | ICD-10-CM | POA: Diagnosis not present

## 2017-08-26 DIAGNOSIS — M6281 Muscle weakness (generalized): Secondary | ICD-10-CM | POA: Diagnosis not present

## 2017-08-26 DIAGNOSIS — R2689 Other abnormalities of gait and mobility: Secondary | ICD-10-CM | POA: Diagnosis not present

## 2017-08-26 DIAGNOSIS — M25552 Pain in left hip: Secondary | ICD-10-CM | POA: Diagnosis not present

## 2017-08-28 DIAGNOSIS — M47816 Spondylosis without myelopathy or radiculopathy, lumbar region: Secondary | ICD-10-CM | POA: Diagnosis not present

## 2017-08-28 DIAGNOSIS — M4125 Other idiopathic scoliosis, thoracolumbar region: Secondary | ICD-10-CM | POA: Diagnosis not present

## 2017-08-28 DIAGNOSIS — G894 Chronic pain syndrome: Secondary | ICD-10-CM | POA: Diagnosis not present

## 2017-08-29 DIAGNOSIS — M6281 Muscle weakness (generalized): Secondary | ICD-10-CM | POA: Diagnosis not present

## 2017-08-29 DIAGNOSIS — M25552 Pain in left hip: Secondary | ICD-10-CM | POA: Diagnosis not present

## 2017-08-29 DIAGNOSIS — S32592D Other specified fracture of left pubis, subsequent encounter for fracture with routine healing: Secondary | ICD-10-CM | POA: Diagnosis not present

## 2017-08-29 DIAGNOSIS — R2689 Other abnormalities of gait and mobility: Secondary | ICD-10-CM | POA: Diagnosis not present

## 2017-09-01 DIAGNOSIS — M25552 Pain in left hip: Secondary | ICD-10-CM | POA: Diagnosis not present

## 2017-09-01 DIAGNOSIS — S32592D Other specified fracture of left pubis, subsequent encounter for fracture with routine healing: Secondary | ICD-10-CM | POA: Diagnosis not present

## 2017-09-01 DIAGNOSIS — R233 Spontaneous ecchymoses: Secondary | ICD-10-CM | POA: Diagnosis not present

## 2017-09-01 DIAGNOSIS — M6281 Muscle weakness (generalized): Secondary | ICD-10-CM | POA: Diagnosis not present

## 2017-09-01 DIAGNOSIS — R2689 Other abnormalities of gait and mobility: Secondary | ICD-10-CM | POA: Diagnosis not present

## 2017-09-04 DIAGNOSIS — M25552 Pain in left hip: Secondary | ICD-10-CM | POA: Diagnosis not present

## 2017-09-04 DIAGNOSIS — M6281 Muscle weakness (generalized): Secondary | ICD-10-CM | POA: Diagnosis not present

## 2017-09-04 DIAGNOSIS — S32592D Other specified fracture of left pubis, subsequent encounter for fracture with routine healing: Secondary | ICD-10-CM | POA: Diagnosis not present

## 2017-09-04 DIAGNOSIS — R2689 Other abnormalities of gait and mobility: Secondary | ICD-10-CM | POA: Diagnosis not present

## 2017-09-11 DIAGNOSIS — R2689 Other abnormalities of gait and mobility: Secondary | ICD-10-CM | POA: Diagnosis not present

## 2017-09-11 DIAGNOSIS — S32592D Other specified fracture of left pubis, subsequent encounter for fracture with routine healing: Secondary | ICD-10-CM | POA: Diagnosis not present

## 2017-09-11 DIAGNOSIS — M6281 Muscle weakness (generalized): Secondary | ICD-10-CM | POA: Diagnosis not present

## 2017-09-11 DIAGNOSIS — M25552 Pain in left hip: Secondary | ICD-10-CM | POA: Diagnosis not present

## 2017-09-17 DIAGNOSIS — M47816 Spondylosis without myelopathy or radiculopathy, lumbar region: Secondary | ICD-10-CM | POA: Diagnosis not present

## 2017-09-30 DIAGNOSIS — N390 Urinary tract infection, site not specified: Secondary | ICD-10-CM | POA: Diagnosis not present

## 2017-10-15 DIAGNOSIS — M47816 Spondylosis without myelopathy or radiculopathy, lumbar region: Secondary | ICD-10-CM | POA: Diagnosis not present

## 2017-10-31 DIAGNOSIS — M542 Cervicalgia: Secondary | ICD-10-CM | POA: Diagnosis not present

## 2017-10-31 DIAGNOSIS — G894 Chronic pain syndrome: Secondary | ICD-10-CM | POA: Diagnosis not present

## 2017-10-31 DIAGNOSIS — I10 Essential (primary) hypertension: Secondary | ICD-10-CM | POA: Diagnosis not present

## 2017-10-31 DIAGNOSIS — Z6836 Body mass index (BMI) 36.0-36.9, adult: Secondary | ICD-10-CM | POA: Diagnosis not present

## 2017-11-18 DIAGNOSIS — M47816 Spondylosis without myelopathy or radiculopathy, lumbar region: Secondary | ICD-10-CM | POA: Diagnosis not present

## 2017-12-16 DIAGNOSIS — G894 Chronic pain syndrome: Secondary | ICD-10-CM | POA: Diagnosis not present

## 2017-12-16 DIAGNOSIS — M47816 Spondylosis without myelopathy or radiculopathy, lumbar region: Secondary | ICD-10-CM | POA: Diagnosis not present

## 2018-01-22 DIAGNOSIS — M47816 Spondylosis without myelopathy or radiculopathy, lumbar region: Secondary | ICD-10-CM | POA: Diagnosis not present

## 2018-01-22 DIAGNOSIS — G894 Chronic pain syndrome: Secondary | ICD-10-CM | POA: Diagnosis not present

## 2018-01-22 DIAGNOSIS — M791 Myalgia, unspecified site: Secondary | ICD-10-CM | POA: Diagnosis not present

## 2018-02-25 DIAGNOSIS — K2270 Barrett's esophagus without dysplasia: Secondary | ICD-10-CM | POA: Diagnosis not present

## 2018-03-13 DIAGNOSIS — R0989 Other specified symptoms and signs involving the circulatory and respiratory systems: Secondary | ICD-10-CM | POA: Diagnosis not present

## 2018-03-13 DIAGNOSIS — J342 Deviated nasal septum: Secondary | ICD-10-CM | POA: Diagnosis not present

## 2018-03-13 DIAGNOSIS — R221 Localized swelling, mass and lump, neck: Secondary | ICD-10-CM | POA: Diagnosis not present

## 2018-03-13 DIAGNOSIS — R1310 Dysphagia, unspecified: Secondary | ICD-10-CM | POA: Diagnosis not present

## 2018-03-16 DIAGNOSIS — H2513 Age-related nuclear cataract, bilateral: Secondary | ICD-10-CM | POA: Diagnosis not present

## 2018-03-16 DIAGNOSIS — H16223 Keratoconjunctivitis sicca, not specified as Sjogren's, bilateral: Secondary | ICD-10-CM | POA: Diagnosis not present

## 2018-03-24 DIAGNOSIS — R221 Localized swelling, mass and lump, neck: Secondary | ICD-10-CM | POA: Diagnosis not present

## 2018-03-30 DIAGNOSIS — J342 Deviated nasal septum: Secondary | ICD-10-CM | POA: Diagnosis not present

## 2018-03-30 DIAGNOSIS — R221 Localized swelling, mass and lump, neck: Secondary | ICD-10-CM | POA: Diagnosis not present

## 2018-04-11 DIAGNOSIS — R21 Rash and other nonspecific skin eruption: Secondary | ICD-10-CM | POA: Diagnosis not present

## 2018-04-11 DIAGNOSIS — L249 Irritant contact dermatitis, unspecified cause: Secondary | ICD-10-CM | POA: Diagnosis not present

## 2018-04-15 ENCOUNTER — Ambulatory Visit: Payer: Medicare Other

## 2018-04-15 ENCOUNTER — Encounter: Payer: Self-pay | Admitting: Sports Medicine

## 2018-04-15 ENCOUNTER — Other Ambulatory Visit: Payer: Self-pay

## 2018-04-15 ENCOUNTER — Other Ambulatory Visit: Payer: Self-pay | Admitting: Sports Medicine

## 2018-04-15 ENCOUNTER — Ambulatory Visit (INDEPENDENT_AMBULATORY_CARE_PROVIDER_SITE_OTHER): Payer: Medicare Other | Admitting: Sports Medicine

## 2018-04-15 VITALS — BP 126/78 | HR 67 | Resp 16 | Ht 63.0 in | Wt 182.0 lb

## 2018-04-15 DIAGNOSIS — M1990 Unspecified osteoarthritis, unspecified site: Secondary | ICD-10-CM

## 2018-04-15 DIAGNOSIS — M069 Rheumatoid arthritis, unspecified: Secondary | ICD-10-CM | POA: Diagnosis not present

## 2018-04-15 DIAGNOSIS — M25571 Pain in right ankle and joints of right foot: Secondary | ICD-10-CM | POA: Diagnosis not present

## 2018-04-15 DIAGNOSIS — M779 Enthesopathy, unspecified: Secondary | ICD-10-CM | POA: Diagnosis not present

## 2018-04-15 DIAGNOSIS — L4050 Arthropathic psoriasis, unspecified: Secondary | ICD-10-CM | POA: Diagnosis not present

## 2018-04-15 DIAGNOSIS — M79671 Pain in right foot: Secondary | ICD-10-CM | POA: Diagnosis not present

## 2018-04-15 DIAGNOSIS — B351 Tinea unguium: Secondary | ICD-10-CM

## 2018-04-15 DIAGNOSIS — L608 Other nail disorders: Secondary | ICD-10-CM | POA: Diagnosis not present

## 2018-04-15 DIAGNOSIS — L84 Corns and callosities: Secondary | ICD-10-CM | POA: Diagnosis not present

## 2018-04-15 MED ORDER — TRIAMCINOLONE ACETONIDE 10 MG/ML IJ SUSP
10.0000 mg | Freq: Once | INTRAMUSCULAR | Status: AC
  Administered 2018-04-15: 10 mg

## 2018-04-15 NOTE — Progress Notes (Signed)
   Subjective:    Patient ID: Molly Hogan, female    DOB: 1950-09-24, 67 y.o.   MRN: [id number]  HPI    Review of Systems  All other systems reviewed and are negative.      Objective:   Physical Exam        Assessment & Plan:

## 2018-04-15 NOTE — Progress Notes (Signed)
Subjective: Molly Hogan is a 67 y.o. female patient seen today in office with complaint of mildly painful thickened and discolored nail right great toenail.  Patient is desiring treatment for nail changes; has tried OTC topicals/Medication in the past with no improvement. Reports that nails are becoming difficult to manage because of the thickness.  Patient also reports that this started after getting a pedicure as well as them being very aggressive with rubbing the callus areas at her first toe and at the bottom of her foot underneath her pinky toe where they make them bleed patient states that she constantly now has pain in the great toe and sometimes over the bunion that is not on a 10 and sometimes can be constant at bedtime making it very difficult for her to sleep when the sheets touch her toe.  Patient reports that she tries to take the cover off her toe that seems to help a little bit before she falls back to sleep.  No other treatment.  Denies any injury or trauma.  Patient has no other pedal complaints at this time.   Review of Systems  All other systems reviewed and are negative.   Patient Active Problem List   Diagnosis Date Noted  . Multiple thyroid nodules 07/08/2011    Current Outpatient Medications on File Prior to Visit  Medication Sig Dispense Refill  . Calcium Carbonate-Vitamin D (CALCIUM 600 + D PO) Take by mouth 2 (two) times daily.      . Cholecalciferol (VITAMIN D PO) 300 Units by Other route.      Marland Kitchen estradiol (ESTRACE) 2 MG tablet Take 2 mg by mouth daily.      . LOW-DOSE ASPIRIN PO Take by mouth.      . Multiple Vitamin (MULTIVITAMIN PO) Take by mouth.       No current facility-administered medications on file prior to visit.     No Known Allergies  Objective: Physical Exam  General: Well developed, nourished, no acute distress, awake, alert and oriented x 3  Vascular: Dorsalis pedis artery 1/4 bilateral, Posterior tibial artery 1/4 bilateral, skin  temperature warm to warm proximal to distal bilateral lower extremities, no varicosities, pedal hair present bilateral.  Neurological: Gross sensation present via light touch bilateral.   Dermatological: Skin is warm, dry, and supple bilateral, right hallux nail is short thick, and discolored with mild subungal debris, no webspace macerations present bilateral, no open lesions present bilateral, there is hyperkeratotic tissue present right first and fifth toe.  No signs of infection bilateral.  Musculoskeletal: Great toe and fifth toe joint bunion deformity noted right greater than left, mild lesser hammertoe, muscular strength within normal limits without painon range of motion however there is pain with palpation at the interphalangeal joint of the great toe on the right. No pain with calf compression bilateral.  X-ray right foot consistent with bunion and bunionette and hammertoe deformity no other acute findings.  Assessment and Plan:  Problem List Items Addressed This Visit    None    Visit Diagnoses    Capsulitis    -  Primary   Relevant Medications   triamcinolone acetonide (KENALOG) 10 MG/ML injection 10 mg (Completed) (Start on 04/15/2018 12:45 PM)   Right foot pain       Relevant Orders   Uric Acid   Sedimentation Rate   C-reactive protein   Rheumatoid factor   ANA, IFA Comprehensive Panel   CBC with Differential   Rheumatoid arthritis involving both feet,  unspecified rheumatoid factor presence (HCC)       Relevant Medications   triamcinolone acetonide (KENALOG) 10 MG/ML injection 10 mg (Completed) (Start on 04/15/2018 12:45 PM)   Other Relevant Orders   Uric Acid   Sedimentation Rate   C-reactive protein   Rheumatoid factor   ANA, IFA Comprehensive Panel   CBC with Differential   Arthritis       Relevant Medications   triamcinolone acetonide (KENALOG) 10 MG/ML injection 10 mg (Completed) (Start on 04/15/2018 12:45 PM)   Other Relevant Orders   Uric Acid    Sedimentation Rate   C-reactive protein   Rheumatoid factor   ANA, IFA Comprehensive Panel   CBC with Differential   Arthralgia of right foot       Psoriatic arthritis (HCC)       Relevant Medications   triamcinolone acetonide (KENALOG) 10 MG/ML injection 10 mg (Completed) (Start on 04/15/2018 12:45 PM)   Nail fungus       Relevant Orders   Culture, fungus without smear   Callus          -Examined patient -Discussed treatment options for painful callus along with foot deformity with possible underlying arthralgia versus capsulitis -Ordered arthritic panel -After oral consent and aseptic prep, injected a mixture containing 1 ml of 2%  plain lidocaine, 1 ml 0.5% plain marcaine, 0.5 ml of kenalog 10 and 0.5 ml of dexamethasone phosphate into right hallux at the interphalangeal joint without complication. Post-injection care discussed with patient.  -Dispensed toe cushions -At no charge using a chisel blade trimmed callus at first toe and fifth toe without incident -Discussed treatment options for painful dystrophic nails  -Fungal culture was obtained by removing a portion of the hard nail itself from each of the involved right great toenail using a sterile nail nipper and sent to [facility] lab. Patient tolerated the biopsy procedure well without discomfort or need for anesthesia.  -Patient to return in 4 weeks for follow up evaluation and discussion of fungal culture results or sooner if symptoms worsen.  Asencion Islam, DPM

## 2018-04-16 LAB — CBC WITH DIFFERENTIAL/PLATELET
Basophils Absolute: 0 10*3/uL (ref 0.0–0.2)
Basos: 0 %
EOS (ABSOLUTE): 0.1 10*3/uL (ref 0.0–0.4)
Eos: 1 %
Hematocrit: 38.5 % (ref 34.0–46.6)
Hemoglobin: 13.3 g/dL (ref 11.1–15.9)
Immature Grans (Abs): 0.1 10*3/uL (ref 0.0–0.1)
Immature Granulocytes: 1 %
LYMPHS ABS: 0.8 10*3/uL (ref 0.7–3.1)
Lymphs: 12 %
MCH: 32.2 pg (ref 26.6–33.0)
MCHC: 34.5 g/dL (ref 31.5–35.7)
MCV: 93 fL (ref 79–97)
Monocytes Absolute: 0.2 10*3/uL (ref 0.1–0.9)
Monocytes: 3 %
Neutrophils Absolute: 5.3 10*3/uL (ref 1.4–7.0)
Neutrophils: 83 %
Platelets: 296 10*3/uL (ref 150–450)
RBC: 4.13 x10E6/uL (ref 3.77–5.28)
RDW: 12.2 % — ABNORMAL LOW (ref 12.3–15.4)
WBC: 6.4 10*3/uL (ref 3.4–10.8)

## 2018-04-16 LAB — SEDIMENTATION RATE: SED RATE: 4 mm/h (ref 0–40)

## 2018-04-16 LAB — C-REACTIVE PROTEIN: CRP: 4 mg/L (ref 0–10)

## 2018-04-16 LAB — URIC ACID: Uric Acid: 3.3 mg/dL (ref 2.5–7.1)

## 2018-04-16 LAB — RHEUMATOID FACTOR: Rheumatoid fact SerPl-aCnc: <10 IU/mL (ref 0.0–13.9)

## 2018-04-17 ENCOUNTER — Telehealth: Payer: Self-pay | Admitting: *Deleted

## 2018-04-17 LAB — ANTINUCLEAR ANTIBODIES, IFA: ANA Titer 1: NEGATIVE

## 2018-04-17 NOTE — Telephone Encounter (Signed)
-----   Message from Titorya Stover, DPM sent at 04/17/2018 10:09 AM EST ----- Let patient know that her arthritic panel is negative; her pain is likely from localized inflammation/capsulitis. I will recheck her in 4 weeks as scheduled Thanks Dr. Stover 

## 2018-04-17 NOTE — Telephone Encounter (Signed)
Left message on home phone and mobile phone for pt to call to discuss results.

## 2018-04-20 ENCOUNTER — Telehealth: Payer: Self-pay | Admitting: *Deleted

## 2018-04-20 ENCOUNTER — Telehealth: Payer: Self-pay | Admitting: Sports Medicine

## 2018-04-20 NOTE — Telephone Encounter (Signed)
left message informing pt of Dr. Wynema Birch review of results, and informed I left this message so she would have to wait on me to call again and worry.

## 2018-04-20 NOTE — Telephone Encounter (Signed)
Left message on Result Notes.

## 2018-04-20 NOTE — Telephone Encounter (Signed)
-----   Message from Molly Hogan, [place] sent at 04/17/2018 10:09 AM EST ----- Let patient know that her arthritic panel is negative; her pain is likely from localized inflammation/capsulitis. I will recheck her in 4 weeks as scheduled Thanks Dr. Marylene Land

## 2018-04-20 NOTE — Telephone Encounter (Signed)
Please call patient back with results.

## 2018-04-20 NOTE — Telephone Encounter (Signed)
Review of results left as message on Result Notes.

## 2018-05-07 ENCOUNTER — Telehealth: Payer: Self-pay | Admitting: *Deleted

## 2018-05-07 NOTE — Telephone Encounter (Signed)
Left message on home and mobile phone  To call for results.

## 2018-05-07 NOTE — Telephone Encounter (Signed)
-----   Message from Asencion Islam, [place] sent at 05/07/2018  7:41 AM EST ----- Regarding: Fungal culture results Results suggest that nails are thickened and discolored due to possible traumatic change which is likely from pressure from shoes versus stubbing or bumping the toes.  At this time recommend patient to keep her nails trimmed and filed appropriately, and avoid tight fitting shoes. She may also try urea cream or nail gel to her toes or home remedy of Vicks VapoRub. -Dr. Marylene Land

## 2018-05-07 NOTE — Telephone Encounter (Addendum)
Left message on home phone requesting call back to discuss labs.

## 2018-05-10 DIAGNOSIS — J209 Acute bronchitis, unspecified: Secondary | ICD-10-CM | POA: Diagnosis not present

## 2018-05-10 DIAGNOSIS — J019 Acute sinusitis, unspecified: Secondary | ICD-10-CM | POA: Diagnosis not present

## 2018-05-13 ENCOUNTER — Telehealth: Payer: Self-pay | Admitting: *Deleted

## 2018-05-13 NOTE — Telephone Encounter (Signed)
Pt called for fungal culture results states has had bronchitis, that is why she did not call. I informed pt of Dr. Wynema Birch review of results and orders, and suggested in-office store Revitaderm40 and the instructions. Pt states she has an appt Friday and will discuss at that time.

## 2018-05-13 NOTE — Telephone Encounter (Signed)
-----   Message from Titorya Stover, DPM sent at 05/07/2018  7:41 AM EST ----- Regarding: Fungal culture results Results suggest that nails are thickened and discolored due to possible traumatic change which is likely from pressure from shoes versus stubbing or bumping the toes.  At this time recommend patient to keep her nails trimmed and filed appropriately, and avoid tight fitting shoes. She may also try urea cream or nail gel to her toes or home remedy of Vicks VapoRub. -Dr. Stover  

## 2018-05-15 ENCOUNTER — Ambulatory Visit: Payer: Medicare Other | Admitting: Sports Medicine

## 2018-05-19 DIAGNOSIS — Z6833 Body mass index (BMI) 33.0-33.9, adult: Secondary | ICD-10-CM | POA: Diagnosis not present

## 2018-05-19 DIAGNOSIS — J329 Chronic sinusitis, unspecified: Secondary | ICD-10-CM | POA: Diagnosis not present

## 2018-05-19 DIAGNOSIS — N390 Urinary tract infection, site not specified: Secondary | ICD-10-CM | POA: Diagnosis not present

## 2018-05-19 DIAGNOSIS — J40 Bronchitis, not specified as acute or chronic: Secondary | ICD-10-CM | POA: Diagnosis not present

## 2018-05-22 ENCOUNTER — Encounter: Payer: Self-pay | Admitting: Sports Medicine

## 2018-05-22 ENCOUNTER — Ambulatory Visit (INDEPENDENT_AMBULATORY_CARE_PROVIDER_SITE_OTHER): Payer: Medicare Other | Admitting: Sports Medicine

## 2018-05-22 ENCOUNTER — Ambulatory Visit (INDEPENDENT_AMBULATORY_CARE_PROVIDER_SITE_OTHER): Payer: Medicare Other

## 2018-05-22 DIAGNOSIS — S99921A Unspecified injury of right foot, initial encounter: Secondary | ICD-10-CM | POA: Diagnosis not present

## 2018-05-22 DIAGNOSIS — B351 Tinea unguium: Secondary | ICD-10-CM

## 2018-05-22 DIAGNOSIS — S93601A Unspecified sprain of right foot, initial encounter: Secondary | ICD-10-CM

## 2018-05-22 DIAGNOSIS — M79671 Pain in right foot: Secondary | ICD-10-CM

## 2018-05-22 NOTE — Progress Notes (Signed)
Subjective: Nomi Rudnicki Tift is a 68 y.o. female patient seen today in office for fungal culture results.  Patient also reports that 2 weeks ago she tripped and fell on an open door frame and twisted her right foot and ankle states that slowly the pain has gotten better but requests that I x-rayed because she wants to make sure it is not broken.  Patient states that she has been trying to rest and elevate and since doing so the foot has improved.  Patient denies redness, warmth, swelling or any other constitutional symptoms at this time.  Patient has no other pedal complaints at this time.   Patient Active Problem List   Diagnosis Date Noted  . Multiple thyroid nodules 07/08/2011    Current Outpatient Medications on File Prior to Visit  Medication Sig Dispense Refill  . azithromycin (ZITHROMAX) 500 MG tablet TK 1 T PO QD    . baclofen (LIORESAL) 10 MG tablet TAKE 1 TABLET BY MOUTH THREE (3) TIMES DAILY AS NEEDED  5  . Calcium Carbonate-Vitamin D (CALCIUM 600 + D PO) Take by mouth 2 (two) times daily.      . cephALEXin (KEFLEX) 500 MG capsule Take 500 mg by mouth 2 (two) times daily.    . Cholecalciferol (VITAMIN D PO) 300 Units by Other route.      Marland Kitchen estradiol (ESTRACE) 2 MG tablet Take 2 mg by mouth daily.      . fluconazole (DIFLUCAN) 150 MG tablet TAKE ONE TABLET BY MOUTH NOW, THEN REPEAT IN 1 WEEK    . gabapentin (NEURONTIN) 300 MG capsule TAKE 1 TO 2 CAPSULES BY MOUTH 3 TIMES DAILY AS NEEDED  2  . LOW-DOSE ASPIRIN PO Take by mouth.      . meloxicam (MOBIC) 15 MG tablet TAKE 1 TABLET BY MOUTH ONCE (1) DAILY WITH FOOD  0  . Multiple Vitamin (MULTIVITAMIN PO) Take by mouth.      . ranitidine (ZANTAC) 150 MG tablet TAKE 1 TABLET BY MOUTH TWICE (2) DAILY  12  . VIRTUSSIN A/C 100-10 MG/5ML syrup TAKE 5-10 MILLILITERS BY MOUTH EVERY 6 TO 8 HOURS AS NEEDED FOR COUGH    . Vitamin D, Ergocalciferol, (DRISDOL) 1.25 MG (50000 UT) CAPS capsule TAKE ONE CAPSULE BY MOUTH TWICE WEEKLY  1   No  current facility-administered medications on file prior to visit.     No Known Allergies  Objective: Physical Exam  General: Well developed, nourished, no acute distress, awake, alert and oriented x 3  Vascular: Dorsalis pedis artery 1/4 bilateral, Posterior tibial artery 1/4 bilateral, skin temperature warm to warm proximal to distal bilateral lower extremities, no varicosities, pedal hair present bilateral.  Very minimal callus to medial right hallux and sub-met 5 on right.  Neurological: Gross sensation present via light touch bilateral.   Dermatological: Skin is warm, dry, and supple bilateral, Nails 1-10 are tender, short thick, and discolored with mild subungal debris especially at right hallux with most involvement distally and trauma lines present, no webspace macerations present bilateral, no open lesions present bilateral, no callus/corns/hyperkeratotic tissue present bilateral. No signs of infection bilateral.  Musculoskeletal: There is great toe and fifth toe joint bunion deformity right greater than left with mild lesser hammertoe deformity, there is mild diffuse pain over the dorsal lateral right foot and over the lateral ankle ligaments, no instability, no other acute findings. No pain with calf compression bilateral.  Fungal culture negative  Assessment and Plan:  Problem List Items Addressed This Visit  None    Visit Diagnoses    Sprain of right foot, initial encounter    -  Primary   Relevant Orders   DG Foot Complete Right   Injury, foot, right, initial encounter       Right foot pain       Nail fungus       Relevant Medications   fluconazole (DIFLUCAN) 150 MG tablet   cephALEXin (KEFLEX) 500 MG capsule   azithromycin (ZITHROMAX) 500 MG tablet     -Examined patient -Discussed treatment options for painful mycotic nails and for right foot sprain -RecommendRevitaderm or Vicks VapoRub to nail daily until improved -Advised good hygiene habits and good  supportive shoes that do not rub or irritate the right great toe that is most involved -X-rays reviewed of the right foot and ankle which revealed no fracture or dislocation, unchanged midfoot arthritis, and calcaneal spur, soft tissue margins within normal limits, no other acute findings. -Advised patient rest ice elevation for right foot and ankle pain and dispensed a compression sleeve to wear until symptoms are improved -Patient to return as needed or sooner if symptoms worsen.  Landis Martins, DPM

## 2018-05-27 DIAGNOSIS — Z6833 Body mass index (BMI) 33.0-33.9, adult: Secondary | ICD-10-CM | POA: Diagnosis not present

## 2018-05-27 DIAGNOSIS — Z124 Encounter for screening for malignant neoplasm of cervix: Secondary | ICD-10-CM | POA: Diagnosis not present

## 2018-05-27 DIAGNOSIS — Z1231 Encounter for screening mammogram for malignant neoplasm of breast: Secondary | ICD-10-CM | POA: Diagnosis not present

## 2018-06-18 DIAGNOSIS — G894 Chronic pain syndrome: Secondary | ICD-10-CM | POA: Diagnosis not present

## 2018-06-18 DIAGNOSIS — M419 Scoliosis, unspecified: Secondary | ICD-10-CM | POA: Diagnosis not present

## 2018-06-18 DIAGNOSIS — M47816 Spondylosis without myelopathy or radiculopathy, lumbar region: Secondary | ICD-10-CM | POA: Diagnosis not present

## 2018-06-18 DIAGNOSIS — M7918 Myalgia, other site: Secondary | ICD-10-CM | POA: Diagnosis not present

## 2018-07-17 DIAGNOSIS — I87323 Chronic venous hypertension (idiopathic) with inflammation of bilateral lower extremity: Secondary | ICD-10-CM | POA: Diagnosis not present

## 2018-07-17 DIAGNOSIS — Z6834 Body mass index (BMI) 34.0-34.9, adult: Secondary | ICD-10-CM | POA: Diagnosis not present

## 2018-07-17 DIAGNOSIS — R0602 Shortness of breath: Secondary | ICD-10-CM | POA: Diagnosis not present

## 2018-07-27 ENCOUNTER — Telehealth: Payer: Self-pay | Admitting: *Deleted

## 2018-07-27 NOTE — Telephone Encounter (Signed)
Thanks those recommendations are appropriate. At this time she should also refrain from trying to wear a closed toe shoe if this is making her pain worse -Dr. Marylene Land

## 2018-07-27 NOTE — Telephone Encounter (Signed)
Patient called stating she is still having a great deal of pain still in her big toe.  She states that she can not wear a shoe comfortably.  Asked if she had any openings or any drainage patient denied, explained starting epsom salt soaks twice daily using neosporin with pain reliever.  Explained the new protocol in place by [facility] due to the covid19 virus.  Patient is to watch the area closely and let us know if the symptoms worsen and we will get her scheduled as soon as we can.

## 2018-07-29 DIAGNOSIS — L84 Corns and callosities: Secondary | ICD-10-CM | POA: Diagnosis not present

## 2018-07-29 DIAGNOSIS — M19079 Primary osteoarthritis, unspecified ankle and foot: Secondary | ICD-10-CM | POA: Insufficient documentation

## 2018-07-29 DIAGNOSIS — M2061 Acquired deformities of toe(s), unspecified, right foot: Secondary | ICD-10-CM

## 2018-07-29 DIAGNOSIS — M205X1 Other deformities of toe(s) (acquired), right foot: Secondary | ICD-10-CM | POA: Diagnosis not present

## 2018-07-29 DIAGNOSIS — M79674 Pain in right toe(s): Secondary | ICD-10-CM

## 2018-07-29 HISTORY — DX: Acquired deformities of toe(s), unspecified, right foot: M20.61

## 2018-07-29 HISTORY — DX: Pain in right toe(s): M79.674

## 2018-07-29 HISTORY — DX: Primary osteoarthritis, unspecified ankle and foot: M19.079

## 2018-08-08 DIAGNOSIS — S81812A Laceration without foreign body, left lower leg, initial encounter: Secondary | ICD-10-CM | POA: Diagnosis not present

## 2018-08-14 DIAGNOSIS — L0390 Cellulitis, unspecified: Secondary | ICD-10-CM | POA: Diagnosis not present

## 2018-08-14 DIAGNOSIS — L03116 Cellulitis of left lower limb: Secondary | ICD-10-CM | POA: Diagnosis not present

## 2018-08-15 DIAGNOSIS — L03116 Cellulitis of left lower limb: Secondary | ICD-10-CM | POA: Diagnosis not present

## 2018-08-17 DIAGNOSIS — L03116 Cellulitis of left lower limb: Secondary | ICD-10-CM | POA: Diagnosis not present

## 2018-08-19 DIAGNOSIS — L03116 Cellulitis of left lower limb: Secondary | ICD-10-CM | POA: Diagnosis not present

## 2018-08-19 DIAGNOSIS — S81802D Unspecified open wound, left lower leg, subsequent encounter: Secondary | ICD-10-CM | POA: Diagnosis not present

## 2018-08-19 DIAGNOSIS — Z6834 Body mass index (BMI) 34.0-34.9, adult: Secondary | ICD-10-CM | POA: Diagnosis not present

## 2018-08-20 DIAGNOSIS — S80812A Abrasion, left lower leg, initial encounter: Secondary | ICD-10-CM | POA: Diagnosis not present

## 2018-08-20 DIAGNOSIS — I10 Essential (primary) hypertension: Secondary | ICD-10-CM | POA: Diagnosis not present

## 2018-08-20 DIAGNOSIS — S81802A Unspecified open wound, left lower leg, initial encounter: Secondary | ICD-10-CM | POA: Diagnosis not present

## 2018-08-24 DIAGNOSIS — S80812A Abrasion, left lower leg, initial encounter: Secondary | ICD-10-CM | POA: Diagnosis not present

## 2018-08-27 DIAGNOSIS — S81802A Unspecified open wound, left lower leg, initial encounter: Secondary | ICD-10-CM | POA: Diagnosis not present

## 2018-08-27 DIAGNOSIS — S80812A Abrasion, left lower leg, initial encounter: Secondary | ICD-10-CM | POA: Diagnosis not present

## 2018-08-31 DIAGNOSIS — S80812A Abrasion, left lower leg, initial encounter: Secondary | ICD-10-CM | POA: Diagnosis not present

## 2018-09-02 DIAGNOSIS — I87323 Chronic venous hypertension (idiopathic) with inflammation of bilateral lower extremity: Secondary | ICD-10-CM | POA: Diagnosis not present

## 2018-09-02 DIAGNOSIS — K2270 Barrett's esophagus without dysplasia: Secondary | ICD-10-CM | POA: Diagnosis not present

## 2018-09-02 DIAGNOSIS — Z0000 Encounter for general adult medical examination without abnormal findings: Secondary | ICD-10-CM | POA: Diagnosis not present

## 2018-09-02 DIAGNOSIS — E785 Hyperlipidemia, unspecified: Secondary | ICD-10-CM | POA: Diagnosis not present

## 2018-09-02 DIAGNOSIS — Z79899 Other long term (current) drug therapy: Secondary | ICD-10-CM | POA: Diagnosis not present

## 2018-09-02 DIAGNOSIS — K915 Postcholecystectomy syndrome: Secondary | ICD-10-CM | POA: Diagnosis not present

## 2018-09-02 DIAGNOSIS — R739 Hyperglycemia, unspecified: Secondary | ICD-10-CM | POA: Diagnosis not present

## 2018-09-02 DIAGNOSIS — E559 Vitamin D deficiency, unspecified: Secondary | ICD-10-CM | POA: Diagnosis not present

## 2018-09-03 DIAGNOSIS — S80812A Abrasion, left lower leg, initial encounter: Secondary | ICD-10-CM | POA: Diagnosis not present

## 2018-09-03 DIAGNOSIS — S81802A Unspecified open wound, left lower leg, initial encounter: Secondary | ICD-10-CM | POA: Diagnosis not present

## 2018-09-07 DIAGNOSIS — S80812A Abrasion, left lower leg, initial encounter: Secondary | ICD-10-CM | POA: Diagnosis not present

## 2018-09-10 DIAGNOSIS — S80812A Abrasion, left lower leg, initial encounter: Secondary | ICD-10-CM | POA: Diagnosis not present

## 2018-09-10 DIAGNOSIS — S81802A Unspecified open wound, left lower leg, initial encounter: Secondary | ICD-10-CM | POA: Diagnosis not present

## 2018-09-14 DIAGNOSIS — S80812A Abrasion, left lower leg, initial encounter: Secondary | ICD-10-CM | POA: Diagnosis not present

## 2018-09-17 DIAGNOSIS — S80812A Abrasion, left lower leg, initial encounter: Secondary | ICD-10-CM | POA: Diagnosis not present

## 2018-09-17 DIAGNOSIS — S81802A Unspecified open wound, left lower leg, initial encounter: Secondary | ICD-10-CM | POA: Diagnosis not present

## 2018-09-17 DIAGNOSIS — L0889 Other specified local infections of the skin and subcutaneous tissue: Secondary | ICD-10-CM | POA: Diagnosis not present

## 2018-09-23 DIAGNOSIS — L97221 Non-pressure chronic ulcer of left calf limited to breakdown of skin: Secondary | ICD-10-CM | POA: Diagnosis not present

## 2018-09-23 DIAGNOSIS — R600 Localized edema: Secondary | ICD-10-CM | POA: Diagnosis not present

## 2018-09-23 DIAGNOSIS — I8311 Varicose veins of right lower extremity with inflammation: Secondary | ICD-10-CM | POA: Diagnosis not present

## 2018-09-24 DIAGNOSIS — S80812A Abrasion, left lower leg, initial encounter: Secondary | ICD-10-CM | POA: Diagnosis not present

## 2018-09-24 DIAGNOSIS — S81802A Unspecified open wound, left lower leg, initial encounter: Secondary | ICD-10-CM | POA: Diagnosis not present

## 2018-09-30 DIAGNOSIS — I8311 Varicose veins of right lower extremity with inflammation: Secondary | ICD-10-CM | POA: Diagnosis not present

## 2018-09-30 DIAGNOSIS — L97221 Non-pressure chronic ulcer of left calf limited to breakdown of skin: Secondary | ICD-10-CM | POA: Diagnosis not present

## 2018-09-30 DIAGNOSIS — R600 Localized edema: Secondary | ICD-10-CM | POA: Diagnosis not present

## 2018-09-30 DIAGNOSIS — I8312 Varicose veins of left lower extremity with inflammation: Secondary | ICD-10-CM | POA: Diagnosis not present

## 2018-10-01 DIAGNOSIS — S81802A Unspecified open wound, left lower leg, initial encounter: Secondary | ICD-10-CM | POA: Diagnosis not present

## 2018-10-01 DIAGNOSIS — S80812A Abrasion, left lower leg, initial encounter: Secondary | ICD-10-CM | POA: Diagnosis not present

## 2018-10-07 DIAGNOSIS — I8312 Varicose veins of left lower extremity with inflammation: Secondary | ICD-10-CM | POA: Diagnosis not present

## 2018-10-07 DIAGNOSIS — I83202 Varicose veins of unspecified lower extremity with both ulcer of calf and inflammation: Secondary | ICD-10-CM | POA: Diagnosis not present

## 2018-10-08 DIAGNOSIS — S80812A Abrasion, left lower leg, initial encounter: Secondary | ICD-10-CM | POA: Diagnosis not present

## 2018-10-08 DIAGNOSIS — S81802A Unspecified open wound, left lower leg, initial encounter: Secondary | ICD-10-CM | POA: Diagnosis not present

## 2018-10-14 DIAGNOSIS — S81802A Unspecified open wound, left lower leg, initial encounter: Secondary | ICD-10-CM | POA: Diagnosis not present

## 2018-10-14 DIAGNOSIS — S80812A Abrasion, left lower leg, initial encounter: Secondary | ICD-10-CM | POA: Diagnosis not present

## 2018-10-16 DIAGNOSIS — M7918 Myalgia, other site: Secondary | ICD-10-CM | POA: Diagnosis not present

## 2018-10-16 DIAGNOSIS — G894 Chronic pain syndrome: Secondary | ICD-10-CM | POA: Diagnosis not present

## 2018-10-23 DIAGNOSIS — S81802A Unspecified open wound, left lower leg, initial encounter: Secondary | ICD-10-CM | POA: Diagnosis not present

## 2018-10-23 DIAGNOSIS — S80812A Abrasion, left lower leg, initial encounter: Secondary | ICD-10-CM | POA: Diagnosis not present

## 2018-10-29 DIAGNOSIS — S80812A Abrasion, left lower leg, initial encounter: Secondary | ICD-10-CM | POA: Diagnosis not present

## 2018-10-30 DIAGNOSIS — I8312 Varicose veins of left lower extremity with inflammation: Secondary | ICD-10-CM | POA: Diagnosis not present

## 2018-11-05 DIAGNOSIS — S81802A Unspecified open wound, left lower leg, initial encounter: Secondary | ICD-10-CM | POA: Diagnosis not present

## 2018-11-05 DIAGNOSIS — S80812A Abrasion, left lower leg, initial encounter: Secondary | ICD-10-CM | POA: Diagnosis not present

## 2018-11-10 DIAGNOSIS — Z6834 Body mass index (BMI) 34.0-34.9, adult: Secondary | ICD-10-CM | POA: Diagnosis not present

## 2018-11-10 DIAGNOSIS — S80812A Abrasion, left lower leg, initial encounter: Secondary | ICD-10-CM | POA: Diagnosis not present

## 2018-11-10 DIAGNOSIS — N3001 Acute cystitis with hematuria: Secondary | ICD-10-CM | POA: Diagnosis not present

## 2018-11-11 DIAGNOSIS — N3001 Acute cystitis with hematuria: Secondary | ICD-10-CM | POA: Diagnosis not present

## 2018-11-17 DIAGNOSIS — S80812A Abrasion, left lower leg, initial encounter: Secondary | ICD-10-CM | POA: Diagnosis not present

## 2018-11-24 DIAGNOSIS — S80812A Abrasion, left lower leg, initial encounter: Secondary | ICD-10-CM | POA: Diagnosis not present

## 2018-11-24 DIAGNOSIS — R58 Hemorrhage, not elsewhere classified: Secondary | ICD-10-CM | POA: Diagnosis not present

## 2018-11-24 DIAGNOSIS — S81802A Unspecified open wound, left lower leg, initial encounter: Secondary | ICD-10-CM | POA: Diagnosis not present

## 2018-11-24 DIAGNOSIS — I8312 Varicose veins of left lower extremity with inflammation: Secondary | ICD-10-CM | POA: Diagnosis not present

## 2018-12-01 DIAGNOSIS — L97822 Non-pressure chronic ulcer of other part of left lower leg with fat layer exposed: Secondary | ICD-10-CM | POA: Diagnosis not present

## 2018-12-01 DIAGNOSIS — I872 Venous insufficiency (chronic) (peripheral): Secondary | ICD-10-CM | POA: Diagnosis not present

## 2018-12-01 DIAGNOSIS — S80812A Abrasion, left lower leg, initial encounter: Secondary | ICD-10-CM | POA: Diagnosis not present

## 2018-12-08 DIAGNOSIS — I8312 Varicose veins of left lower extremity with inflammation: Secondary | ICD-10-CM | POA: Diagnosis not present

## 2018-12-08 DIAGNOSIS — R600 Localized edema: Secondary | ICD-10-CM | POA: Diagnosis not present

## 2018-12-08 DIAGNOSIS — Z09 Encounter for follow-up examination after completed treatment for conditions other than malignant neoplasm: Secondary | ICD-10-CM | POA: Diagnosis not present

## 2018-12-08 DIAGNOSIS — Z87828 Personal history of other (healed) physical injury and trauma: Secondary | ICD-10-CM | POA: Diagnosis not present

## 2018-12-08 DIAGNOSIS — S81802A Unspecified open wound, left lower leg, initial encounter: Secondary | ICD-10-CM | POA: Diagnosis not present

## 2018-12-22 NOTE — Progress Notes (Signed)
Cardiology Office Note:    Date:  12/23/2018   ID:  Molly Hogan, DOB 1950-08-12, MRN [id number]  PCP:  Street, Stephanie Coup, MD  Cardiologist:  Norman Herrlich, MD   Referring MD: Heywood Bene, MD  ASSESSMENT:    1. Deep vein thrombophlebitis of calf, left (HCC)   2. SOB (shortness of breath)    PLAN:    In order of problems listed above:  1. We are doing her best to access records however clinically she appears to have deep vein thrombosis below the popliteal fossa is having shortness of breath.  It is uncommon but possible to have pulmonary embolism will quickly check d-dimer proBNP renal function refer for cardiac CTA later in the day today if the test to be done tomorrow in the interim she will discontinue her estrogen replacement and discontinue gabapentin that can cause peripheral edema.  Echocardiogram was ordered with her history of valvular heart disease and I will see her back in my office in 4 weeks and prior to leaving today if available I will review those records.  Next appointment 4 weeks  Medication Adjustments/Labs and Tests Ordered: Current medicines are reviewed at length with the patient today.  Concerns regarding medicines are outlined above.  No orders of the defined types were placed in this encounter.  No orders of the defined types were placed in this encounter.    Chief Complaint  Patient presents with   Leg Swelling    History of Present Illness:    Molly Hogan is a 68 y.o. female who is being seen today for the evaluation of edema at the request of Heywood Bene, MD.  April she had trouble with her left leg nonhealing wound and went through the wound care center at [facility] they referred her to [facility] she tells me she is seen by Dr. Jimmie Molly had initial duplex of the leg showing left calf thrombophlebitis went back on the fourth for repeat and it was unchanged she takes hormone replacement is not  anticoagulated she complains of swelling of both legs left greater than right no calf pain but she has had intermittent nonexertional shortness of breath no pleurisy its not severe is been no hemoptysis and no loss of consciousness.  She tells me she has been referred by that center to come and see me.  Were doing our best to try to access records.  She has a long-term history of edema and takes a low-dose diuretic and tells not seen her more than 5 years ago she had minor valvular problems were unable to access any records from the electronic health record and we requested records from her PCP and the vascular specialist.  She has no known history of congenital or rheumatic heart disease.  Reviewed records from [facility] family practice they say in the past she had an ejection fraction of 46%.  There is no records related to her venous thromboembolism.  Past Medical History:  Diagnosis Date   Pneumonia    Thyroid nodule     Past Surgical History:  Procedure Laterality Date   ABDOMINAL HYSTERECTOMY  1992   ANKLE FRACTURE SURGERY  2000   CHOLECYSTECTOMY     GALLBLADDER SURGERY  2005   gastric volulus  2012   LITHOTRIPSY     MENISCUS REPAIR  2013   TONSILLECTOMY  1972   TOTAL KNEE ARTHROPLASTY Left     Current Medications: Current Meds  Medication Sig  acetaminophen (ACETAMINOPHEN EXTRA STRENGTH) 500 MG tablet Take 500 mg by mouth every 6 (six) hours as needed.   baclofen (LIORESAL) 10 MG tablet TAKE 1 TABLET BY MOUTH THREE (3) TIMES DAILY AS NEEDED   Calcium Carbonate-Vitamin D (CALCIUM 600 + D PO) Take 1 tablet by mouth daily.    Docusate Calcium (STOOL SOFTENER PO) Take 2 tablets by mouth daily.   doxylamine, Sleep, (SLEEP AID) 25 MG tablet Take 25 mg by mouth at bedtime as needed.   estradiol (ESTRACE) 2 MG tablet Take 3 mg by mouth daily.    furosemide (LASIX) 20 MG tablet TAKE 1/2 TABLET BY MOUTH EVERY OTHER DAY AS NEEDED FOR swelling TO LEGS   gabapentin  (NEURONTIN) 300 MG capsule Take 300 mg by mouth 2 (two) times daily.   meloxicam (MOBIC) 15 MG tablet TAKE 1 TABLET BY MOUTH ONCE (1) DAILY WITH FOOD   Multiple Vitamin (MULTIVITAMIN PO) Take 1 tablet by mouth daily.    polyethylene glycol (MIRALAX / GLYCOLAX) 17 g packet Take 17 g by mouth daily.   Psyllium (METAMUCIL FIBER PO) Take 2 capsules by mouth daily.   ranitidine (ZANTAC) 150 MG tablet Take 1 tablet by mouth daily.   vitamin C (ASCORBIC ACID) 500 MG tablet Take 500 mg by mouth daily.   Vitamin D, Ergocalciferol, (DRISDOL) 1.25 MG (50000 UT) CAPS capsule TAKE ONE CAPSULE BY MOUTH TWICE WEEKLY   Wheat Dextrin (BENEFIBER PO) Take 1 tablet by mouth daily.     Allergies:   Patient has no known allergies.   Social History   Socioeconomic History   Marital status: Married    Spouse name: Not on file   Number of children: Not on file   Years of education: Not on file   Highest education level: Not on file  Occupational History   Not on file  Social Needs   Financial resource strain: Not on file   Food insecurity    Worry: Not on file    Inability: Not on file   Transportation needs    Medical: Not on file    Non-medical: Not on file  Tobacco Use   Smoking status: Never Smoker   Smokeless tobacco: Never Used  Substance and Sexual Activity   Alcohol use: No   Drug use: No   Sexual activity: Not on file  Lifestyle   Physical activity    Days per week: Not on file    Minutes per session: Not on file   Stress: Not on file  Relationships   Social connections    Talks on phone: Not on file    Gets together: Not on file    Attends religious service: Not on file    Active member of club or organization: Not on file    Attends meetings of clubs or organizations: Not on file    Relationship status: Not on file  Other Topics Concern   Not on file  Social History Narrative   Not on file     Family History: The patient's family history includes  Cancer in her mother.  ROS:   Review of Systems  Constitution: Negative.  HENT: Negative.   Eyes: Negative.   Cardiovascular: Positive for leg swelling.  Respiratory: Positive for shortness of breath.   Endocrine: Negative.   Hematologic/Lymphatic: Negative.   Musculoskeletal: Negative.   Gastrointestinal: Negative.   Genitourinary: Negative.   Neurological: Negative.   Psychiatric/Behavioral: Negative.   Allergic/Immunologic: Negative.    Please see the history  of present illness.     All other systems reviewed and are negative.  EKGs/Labs/Other Studies Reviewed:    The following studies were reviewed today:   EKG:  EKG is  ordered today.  The ekg ordered today is personally reviewed and demonstrates sinus rhythm normal  Recent Labs: 09/02/2018 cholesterol 197 LDL 97 HDL 72 creatinine 0.6  Physical Exam:    VS:  BP 118/80 (BP Location: Left Arm, Patient Position: Sitting, Cuff Size: Large)    Pulse 72    Ht 5\' 3"  (1.6 m)    Wt 183 lb 9.6 oz (83.3 kg)    SpO2 97%    BMI 32.52 kg/m     Wt Readings from Last 3 Encounters:  12/23/18 183 lb 9.6 oz (83.3 kg)  04/15/18 182 lb (82.6 kg)  05/17/13 197 lb 3.2 oz (89.4 kg)     GEN:  Well nourished, well developed in no acute distress HEENT: Normal NECK: No JVD; No carotid bruits LYMPHATICS: No lymphadenopathy CARDIAC: RRR, no murmurs, rubs, gallops RESPIRATORY:  Clear to auscultation without rales, wheezing or rhonchi  ABDOMEN: Soft, non-tender, non-distended MUSCULOSKELETAL: She has bilateral varicose veins and has edema in the left calf the calf is tense she has mild edema warm but nontender.  Edema; No deformity  SKIN: Warm and dry NEUROLOGIC:  Alert and oriented x 3 PSYCHIATRIC:  Normal affect     Signed, Shirlee More, MD  12/23/2018 3:59 PM    [place]

## 2018-12-23 ENCOUNTER — Other Ambulatory Visit: Payer: Self-pay

## 2018-12-23 ENCOUNTER — Encounter: Payer: Self-pay | Admitting: Cardiology

## 2018-12-23 ENCOUNTER — Ambulatory Visit (INDEPENDENT_AMBULATORY_CARE_PROVIDER_SITE_OTHER): Payer: Medicare Other | Admitting: Cardiology

## 2018-12-23 VITALS — BP 118/80 | HR 72 | Ht 63.0 in | Wt 183.6 lb

## 2018-12-23 DIAGNOSIS — I80202 Phlebitis and thrombophlebitis of unspecified deep vessels of left lower extremity: Secondary | ICD-10-CM

## 2018-12-23 DIAGNOSIS — R0602 Shortness of breath: Secondary | ICD-10-CM | POA: Diagnosis not present

## 2018-12-23 DIAGNOSIS — M7989 Other specified soft tissue disorders: Secondary | ICD-10-CM | POA: Diagnosis not present

## 2018-12-23 HISTORY — DX: Shortness of breath: R06.02

## 2018-12-23 HISTORY — DX: Other specified soft tissue disorders: M79.89

## 2018-12-23 NOTE — Patient Instructions (Addendum)
Medication Instructions:  Your physician has recommended you make the following change in your medication:   STOP Estradiol STOP gabapentin  If you need a refill on your cardiac medications before your next appointment, please call your pharmacy.   Lab work: Your physician recommends that you return for lab work today: STAT ProBNP, BMP, D-dimer.   If you have labs (blood work) drawn today and your tests are completely normal, you will receive your results only by: Marland Kitchen MyChart Message (if you have MyChart) OR . A paper copy in the mail If you have any lab test that is abnormal or we need to change your treatment, we will call you to review the results.  Testing/Procedures: You had an EKG today.   Your physician has requested that you have an echocardiogram. Echocardiography is a painless test that uses sound waves to create images of your heart. It provides your doctor with information about the size and shape of your heart and how well your heart's chambers and valves are working. This procedure takes approximately one hour. There are no restrictions for this procedure.  Non-Cardiac CT scanning, (CAT scanning), is a noninvasive, special x-ray that produces cross-sectional images of the body using x-rays and a computer. CT scans help physicians diagnose and treat medical conditions. For some CT exams, a contrast material is used to enhance visibility in the area of the body being studied. CT scans provide greater clarity and reveal more details than regular x-ray exams.  Follow-Up: At [facility], you and your health needs are our priority.  As part of our continuing mission to provide you with exceptional heart care, we have created designated Provider Care Teams.  These Care Teams include your primary Cardiologist (physician) and Advanced Practice Providers (APPs -  Physician Assistants and Nurse Practitioners) who all work together to provide you with the care you need, when you need  it. You will need a follow up appointment in 4 weeks.  Any Other Special Instructions Will Be Listed Below (If Applicable).   We are doing a CT chest to rule out PE (blood clot in the lung)

## 2018-12-24 LAB — BASIC METABOLIC PANEL
BUN/Creatinine Ratio: 27 (ref 12–28)
BUN: 17 mg/dL (ref 8–27)
CO2: 24 mmol/L (ref 20–29)
Calcium: 9.4 mg/dL (ref 8.7–10.3)
Chloride: 104 mmol/L (ref 96–106)
Creatinine, Ser: 0.64 mg/dL (ref 0.57–1.00)
GFR calc Af Amer: 107 mL/min/{1.73_m2} (ref >59)
GFR calc non Af Amer: 93 mL/min/{1.73_m2} (ref >59)
Glucose: 81 mg/dL (ref 65–99)
Potassium: 4.4 mmol/L (ref 3.5–5.2)
Sodium: 142 mmol/L (ref 134–144)

## 2018-12-24 LAB — PRO B NATRIURETIC PEPTIDE: NT-Pro BNP: 121 pg/mL (ref 0–301)

## 2018-12-24 LAB — D-DIMER, QUANTITATIVE: D-DIMER: 0.81 mg/L FEU — ABNORMAL HIGH (ref 0.00–0.49)

## 2018-12-28 ENCOUNTER — Telehealth: Payer: Self-pay | Admitting: Family

## 2018-12-28 DIAGNOSIS — R0602 Shortness of breath: Secondary | ICD-10-CM | POA: Diagnosis not present

## 2018-12-28 NOTE — Telephone Encounter (Signed)
Spoke with Mrs. Augello to review CT results at the request of Dr. Bettina Gavia.   She verbalized understanding of all results. Informed that there was no evidence of PE, evidence of some plaque on her aorta and coronary arteries which is present in 50% of those aged 68-60 yo. Informed that we will forward her results to her PCP for scheduling of diagnostic mammogram due to 3.2x2.2 cm mass in left breast. All questions answered.   Informed her to anticipate phone call in the next 1-2 days with results of her echocardiogram.   Loel Dubonnet, NP  ---  CT report below:  1. No demonstrable pulmonary embolus. No thoracic aortic aneurysm or dissection. There is aortic atherosclerosis. There are foci of coronary artery calcification.  2. There is a mass in the left breast measuring 3.2 x 2.2 cm which warrants further evaluation. Advise diagnostic mammography to further assess in this regard.  3. No edema or consolidation. Mild atelectatic change.  4. Small hiatal hernia. Air throughout most of the esophagus may be indicative of chronic reflux. 5. No evident thoracic adenopathy. 6. Galbladder absent.

## 2018-12-29 DIAGNOSIS — I8312 Varicose veins of left lower extremity with inflammation: Secondary | ICD-10-CM | POA: Diagnosis not present

## 2018-12-29 DIAGNOSIS — L239 Allergic contact dermatitis, unspecified cause: Secondary | ICD-10-CM | POA: Diagnosis not present

## 2019-01-01 ENCOUNTER — Telehealth: Payer: Self-pay | Admitting: Cardiology

## 2019-01-01 NOTE — Telephone Encounter (Signed)
Please call patient with results of echo at [facility], she has already received results of CT.Marland Kitchen

## 2019-01-04 NOTE — Telephone Encounter (Signed)
Echocardiogram report has been pulled from [facility]. Will have Dr. Bettina Gavia review and advise of results and update patient accordingly.

## 2019-01-04 NOTE — Telephone Encounter (Signed)
Patient notified Dr Bettina Gavia has reviewed echocardiogram results and advised the results are normal.  Patient verbalized understanding.  No further instructions.

## 2019-01-06 DIAGNOSIS — N6321 Unspecified lump in the left breast, upper outer quadrant: Secondary | ICD-10-CM | POA: Diagnosis not present

## 2019-01-06 DIAGNOSIS — N6002 Solitary cyst of left breast: Secondary | ICD-10-CM | POA: Diagnosis not present

## 2019-01-07 DIAGNOSIS — R319 Hematuria, unspecified: Secondary | ICD-10-CM | POA: Diagnosis not present

## 2019-01-07 DIAGNOSIS — R739 Hyperglycemia, unspecified: Secondary | ICD-10-CM | POA: Diagnosis not present

## 2019-01-07 DIAGNOSIS — R5383 Other fatigue: Secondary | ICD-10-CM | POA: Diagnosis not present

## 2019-01-07 DIAGNOSIS — R1033 Periumbilical pain: Secondary | ICD-10-CM | POA: Diagnosis not present

## 2019-01-07 DIAGNOSIS — E785 Hyperlipidemia, unspecified: Secondary | ICD-10-CM | POA: Diagnosis not present

## 2019-01-08 DIAGNOSIS — K660 Peritoneal adhesions (postprocedural) (postinfection): Secondary | ICD-10-CM | POA: Diagnosis not present

## 2019-01-08 DIAGNOSIS — K56609 Unspecified intestinal obstruction, unspecified as to partial versus complete obstruction: Secondary | ICD-10-CM | POA: Diagnosis not present

## 2019-01-08 DIAGNOSIS — Z79899 Other long term (current) drug therapy: Secondary | ICD-10-CM | POA: Diagnosis not present

## 2019-01-08 DIAGNOSIS — R1033 Periumbilical pain: Secondary | ICD-10-CM | POA: Diagnosis not present

## 2019-01-08 DIAGNOSIS — K56601 Complete intestinal obstruction, unspecified as to cause: Secondary | ICD-10-CM | POA: Diagnosis not present

## 2019-01-08 DIAGNOSIS — K9189 Other postprocedural complications and disorders of digestive system: Secondary | ICD-10-CM | POA: Diagnosis not present

## 2019-01-08 DIAGNOSIS — R197 Diarrhea, unspecified: Secondary | ICD-10-CM | POA: Diagnosis not present

## 2019-01-08 DIAGNOSIS — R918 Other nonspecific abnormal finding of lung field: Secondary | ICD-10-CM | POA: Diagnosis not present

## 2019-01-08 DIAGNOSIS — R52 Pain, unspecified: Secondary | ICD-10-CM | POA: Diagnosis not present

## 2019-01-08 DIAGNOSIS — R109 Unspecified abdominal pain: Secondary | ICD-10-CM | POA: Diagnosis not present

## 2019-01-08 DIAGNOSIS — K567 Ileus, unspecified: Secondary | ICD-10-CM | POA: Diagnosis not present

## 2019-01-08 DIAGNOSIS — R110 Nausea: Secondary | ICD-10-CM | POA: Diagnosis not present

## 2019-01-08 DIAGNOSIS — R1084 Generalized abdominal pain: Secondary | ICD-10-CM | POA: Diagnosis not present

## 2019-01-08 DIAGNOSIS — E860 Dehydration: Secondary | ICD-10-CM | POA: Diagnosis not present

## 2019-01-08 DIAGNOSIS — R140 Abdominal distension (gaseous): Secondary | ICD-10-CM | POA: Diagnosis not present

## 2019-01-08 DIAGNOSIS — Z96652 Presence of left artificial knee joint: Secondary | ICD-10-CM | POA: Diagnosis present

## 2019-01-08 DIAGNOSIS — Z7982 Long term (current) use of aspirin: Secondary | ICD-10-CM | POA: Diagnosis not present

## 2019-01-08 DIAGNOSIS — E872 Acidosis: Secondary | ICD-10-CM | POA: Diagnosis not present

## 2019-01-08 DIAGNOSIS — R0602 Shortness of breath: Secondary | ICD-10-CM | POA: Diagnosis present

## 2019-01-08 DIAGNOSIS — K5651 Intestinal adhesions [bands], with partial obstruction: Secondary | ICD-10-CM | POA: Diagnosis not present

## 2019-01-08 DIAGNOSIS — J9811 Atelectasis: Secondary | ICD-10-CM | POA: Diagnosis not present

## 2019-01-08 DIAGNOSIS — D72829 Elevated white blood cell count, unspecified: Secondary | ICD-10-CM | POA: Diagnosis not present

## 2019-01-08 DIAGNOSIS — M1990 Unspecified osteoarthritis, unspecified site: Secondary | ICD-10-CM | POA: Diagnosis present

## 2019-01-08 DIAGNOSIS — Z452 Encounter for adjustment and management of vascular access device: Secondary | ICD-10-CM | POA: Diagnosis not present

## 2019-01-08 DIAGNOSIS — K219 Gastro-esophageal reflux disease without esophagitis: Secondary | ICD-10-CM | POA: Diagnosis present

## 2019-01-08 DIAGNOSIS — E876 Hypokalemia: Secondary | ICD-10-CM | POA: Diagnosis present

## 2019-01-08 DIAGNOSIS — D649 Anemia, unspecified: Secondary | ICD-10-CM | POA: Diagnosis present

## 2019-01-09 DIAGNOSIS — R140 Abdominal distension (gaseous): Secondary | ICD-10-CM | POA: Diagnosis not present

## 2019-01-09 DIAGNOSIS — R110 Nausea: Secondary | ICD-10-CM | POA: Diagnosis not present

## 2019-01-09 DIAGNOSIS — K56609 Unspecified intestinal obstruction, unspecified as to partial versus complete obstruction: Secondary | ICD-10-CM | POA: Diagnosis not present

## 2019-01-09 DIAGNOSIS — E860 Dehydration: Secondary | ICD-10-CM | POA: Diagnosis not present

## 2019-01-10 DIAGNOSIS — R110 Nausea: Secondary | ICD-10-CM | POA: Diagnosis not present

## 2019-01-10 DIAGNOSIS — J9811 Atelectasis: Secondary | ICD-10-CM | POA: Diagnosis not present

## 2019-01-10 DIAGNOSIS — E860 Dehydration: Secondary | ICD-10-CM | POA: Diagnosis not present

## 2019-01-10 DIAGNOSIS — R140 Abdominal distension (gaseous): Secondary | ICD-10-CM | POA: Diagnosis not present

## 2019-01-10 DIAGNOSIS — K56609 Unspecified intestinal obstruction, unspecified as to partial versus complete obstruction: Secondary | ICD-10-CM | POA: Diagnosis not present

## 2019-01-11 DIAGNOSIS — K660 Peritoneal adhesions (postprocedural) (postinfection): Secondary | ICD-10-CM | POA: Diagnosis not present

## 2019-01-11 DIAGNOSIS — K5651 Intestinal adhesions [bands], with partial obstruction: Secondary | ICD-10-CM | POA: Diagnosis not present

## 2019-01-11 DIAGNOSIS — R110 Nausea: Secondary | ICD-10-CM | POA: Diagnosis not present

## 2019-01-11 DIAGNOSIS — E860 Dehydration: Secondary | ICD-10-CM | POA: Diagnosis not present

## 2019-01-11 DIAGNOSIS — K56609 Unspecified intestinal obstruction, unspecified as to partial versus complete obstruction: Secondary | ICD-10-CM | POA: Diagnosis not present

## 2019-01-12 DIAGNOSIS — E860 Dehydration: Secondary | ICD-10-CM | POA: Diagnosis not present

## 2019-01-12 DIAGNOSIS — R110 Nausea: Secondary | ICD-10-CM | POA: Diagnosis not present

## 2019-01-12 DIAGNOSIS — K56609 Unspecified intestinal obstruction, unspecified as to partial versus complete obstruction: Secondary | ICD-10-CM | POA: Diagnosis not present

## 2019-01-13 DIAGNOSIS — R140 Abdominal distension (gaseous): Secondary | ICD-10-CM | POA: Diagnosis not present

## 2019-01-13 DIAGNOSIS — K56609 Unspecified intestinal obstruction, unspecified as to partial versus complete obstruction: Secondary | ICD-10-CM | POA: Diagnosis not present

## 2019-01-13 DIAGNOSIS — R110 Nausea: Secondary | ICD-10-CM | POA: Diagnosis not present

## 2019-01-13 DIAGNOSIS — E860 Dehydration: Secondary | ICD-10-CM | POA: Diagnosis not present

## 2019-01-14 DIAGNOSIS — R918 Other nonspecific abnormal finding of lung field: Secondary | ICD-10-CM | POA: Diagnosis not present

## 2019-01-14 DIAGNOSIS — R110 Nausea: Secondary | ICD-10-CM | POA: Diagnosis not present

## 2019-01-14 DIAGNOSIS — K56609 Unspecified intestinal obstruction, unspecified as to partial versus complete obstruction: Secondary | ICD-10-CM | POA: Diagnosis not present

## 2019-01-14 DIAGNOSIS — E860 Dehydration: Secondary | ICD-10-CM | POA: Diagnosis not present

## 2019-01-15 ENCOUNTER — Ambulatory Visit: Payer: Medicare Other | Admitting: Cardiology

## 2019-01-15 DIAGNOSIS — K56609 Unspecified intestinal obstruction, unspecified as to partial versus complete obstruction: Secondary | ICD-10-CM | POA: Diagnosis not present

## 2019-01-15 DIAGNOSIS — R110 Nausea: Secondary | ICD-10-CM | POA: Diagnosis not present

## 2019-01-15 DIAGNOSIS — E860 Dehydration: Secondary | ICD-10-CM | POA: Diagnosis not present

## 2019-01-16 DIAGNOSIS — K56609 Unspecified intestinal obstruction, unspecified as to partial versus complete obstruction: Secondary | ICD-10-CM | POA: Diagnosis not present

## 2019-01-16 DIAGNOSIS — E860 Dehydration: Secondary | ICD-10-CM | POA: Diagnosis not present

## 2019-01-16 DIAGNOSIS — R110 Nausea: Secondary | ICD-10-CM | POA: Diagnosis not present

## 2019-01-17 DIAGNOSIS — R110 Nausea: Secondary | ICD-10-CM | POA: Diagnosis not present

## 2019-01-17 DIAGNOSIS — K56609 Unspecified intestinal obstruction, unspecified as to partial versus complete obstruction: Secondary | ICD-10-CM | POA: Diagnosis not present

## 2019-01-17 DIAGNOSIS — E860 Dehydration: Secondary | ICD-10-CM | POA: Diagnosis not present

## 2019-01-18 DIAGNOSIS — R110 Nausea: Secondary | ICD-10-CM | POA: Diagnosis not present

## 2019-01-18 DIAGNOSIS — K56609 Unspecified intestinal obstruction, unspecified as to partial versus complete obstruction: Secondary | ICD-10-CM | POA: Diagnosis not present

## 2019-01-18 DIAGNOSIS — E860 Dehydration: Secondary | ICD-10-CM | POA: Diagnosis not present

## 2019-01-19 DIAGNOSIS — R110 Nausea: Secondary | ICD-10-CM | POA: Diagnosis not present

## 2019-01-19 DIAGNOSIS — K56609 Unspecified intestinal obstruction, unspecified as to partial versus complete obstruction: Secondary | ICD-10-CM | POA: Diagnosis not present

## 2019-01-19 DIAGNOSIS — E860 Dehydration: Secondary | ICD-10-CM | POA: Diagnosis not present

## 2019-01-20 DIAGNOSIS — K56609 Unspecified intestinal obstruction, unspecified as to partial versus complete obstruction: Secondary | ICD-10-CM | POA: Diagnosis not present

## 2019-01-20 DIAGNOSIS — R110 Nausea: Secondary | ICD-10-CM | POA: Diagnosis not present

## 2019-01-20 DIAGNOSIS — E860 Dehydration: Secondary | ICD-10-CM | POA: Diagnosis not present

## 2019-01-21 DIAGNOSIS — E860 Dehydration: Secondary | ICD-10-CM | POA: Diagnosis not present

## 2019-01-21 DIAGNOSIS — R110 Nausea: Secondary | ICD-10-CM | POA: Diagnosis not present

## 2019-01-21 DIAGNOSIS — K56609 Unspecified intestinal obstruction, unspecified as to partial versus complete obstruction: Secondary | ICD-10-CM | POA: Diagnosis not present

## 2019-01-22 DIAGNOSIS — R110 Nausea: Secondary | ICD-10-CM | POA: Diagnosis not present

## 2019-01-22 DIAGNOSIS — E860 Dehydration: Secondary | ICD-10-CM | POA: Diagnosis not present

## 2019-01-22 DIAGNOSIS — K56609 Unspecified intestinal obstruction, unspecified as to partial versus complete obstruction: Secondary | ICD-10-CM | POA: Diagnosis not present

## 2019-01-23 DIAGNOSIS — E860 Dehydration: Secondary | ICD-10-CM | POA: Diagnosis not present

## 2019-01-23 DIAGNOSIS — R110 Nausea: Secondary | ICD-10-CM | POA: Diagnosis not present

## 2019-01-23 DIAGNOSIS — K56609 Unspecified intestinal obstruction, unspecified as to partial versus complete obstruction: Secondary | ICD-10-CM | POA: Diagnosis not present

## 2019-01-24 DIAGNOSIS — K56609 Unspecified intestinal obstruction, unspecified as to partial versus complete obstruction: Secondary | ICD-10-CM | POA: Diagnosis not present

## 2019-01-24 DIAGNOSIS — E860 Dehydration: Secondary | ICD-10-CM | POA: Diagnosis not present

## 2019-01-24 DIAGNOSIS — R110 Nausea: Secondary | ICD-10-CM | POA: Diagnosis not present

## 2019-01-25 DIAGNOSIS — K56609 Unspecified intestinal obstruction, unspecified as to partial versus complete obstruction: Secondary | ICD-10-CM | POA: Diagnosis not present

## 2019-01-25 DIAGNOSIS — R110 Nausea: Secondary | ICD-10-CM | POA: Diagnosis not present

## 2019-01-25 DIAGNOSIS — E860 Dehydration: Secondary | ICD-10-CM | POA: Diagnosis not present

## 2019-01-26 DIAGNOSIS — E860 Dehydration: Secondary | ICD-10-CM | POA: Diagnosis not present

## 2019-01-26 DIAGNOSIS — R110 Nausea: Secondary | ICD-10-CM | POA: Diagnosis not present

## 2019-01-26 DIAGNOSIS — K56609 Unspecified intestinal obstruction, unspecified as to partial versus complete obstruction: Secondary | ICD-10-CM | POA: Diagnosis not present

## 2019-01-27 DIAGNOSIS — R110 Nausea: Secondary | ICD-10-CM | POA: Diagnosis not present

## 2019-01-27 DIAGNOSIS — K56609 Unspecified intestinal obstruction, unspecified as to partial versus complete obstruction: Secondary | ICD-10-CM | POA: Diagnosis not present

## 2019-01-27 DIAGNOSIS — E860 Dehydration: Secondary | ICD-10-CM | POA: Diagnosis not present

## 2019-01-28 DIAGNOSIS — K56609 Unspecified intestinal obstruction, unspecified as to partial versus complete obstruction: Secondary | ICD-10-CM | POA: Diagnosis not present

## 2019-01-28 DIAGNOSIS — R110 Nausea: Secondary | ICD-10-CM | POA: Diagnosis not present

## 2019-01-28 DIAGNOSIS — E860 Dehydration: Secondary | ICD-10-CM | POA: Diagnosis not present

## 2019-02-05 DIAGNOSIS — K567 Ileus, unspecified: Secondary | ICD-10-CM | POA: Diagnosis not present

## 2019-02-05 DIAGNOSIS — K56609 Unspecified intestinal obstruction, unspecified as to partial versus complete obstruction: Secondary | ICD-10-CM | POA: Diagnosis not present

## 2019-02-09 DIAGNOSIS — Z09 Encounter for follow-up examination after completed treatment for conditions other than malignant neoplasm: Secondary | ICD-10-CM

## 2019-02-09 DIAGNOSIS — K56609 Unspecified intestinal obstruction, unspecified as to partial versus complete obstruction: Secondary | ICD-10-CM | POA: Insufficient documentation

## 2019-02-09 DIAGNOSIS — K56600 Partial intestinal obstruction, unspecified as to cause: Secondary | ICD-10-CM

## 2019-02-09 DIAGNOSIS — Z9889 Other specified postprocedural states: Secondary | ICD-10-CM

## 2019-02-09 DIAGNOSIS — R110 Nausea: Secondary | ICD-10-CM | POA: Insufficient documentation

## 2019-02-09 HISTORY — DX: Nausea: Z98.890

## 2019-02-09 HISTORY — DX: Encounter for follow-up examination after completed treatment for conditions other than malignant neoplasm: Z09

## 2019-02-09 HISTORY — DX: Partial intestinal obstruction, unspecified as to cause: K56.600

## 2019-03-02 DIAGNOSIS — H9201 Otalgia, right ear: Secondary | ICD-10-CM | POA: Diagnosis not present

## 2019-03-02 DIAGNOSIS — N3091 Cystitis, unspecified with hematuria: Secondary | ICD-10-CM | POA: Diagnosis not present

## 2019-03-02 DIAGNOSIS — R0600 Dyspnea, unspecified: Secondary | ICD-10-CM | POA: Diagnosis not present

## 2019-03-02 DIAGNOSIS — Z23 Encounter for immunization: Secondary | ICD-10-CM | POA: Diagnosis not present

## 2019-03-04 DIAGNOSIS — Z6836 Body mass index (BMI) 36.0-36.9, adult: Secondary | ICD-10-CM | POA: Diagnosis not present

## 2019-03-04 DIAGNOSIS — G894 Chronic pain syndrome: Secondary | ICD-10-CM | POA: Diagnosis not present

## 2019-03-04 DIAGNOSIS — M791 Myalgia, unspecified site: Secondary | ICD-10-CM | POA: Diagnosis not present

## 2019-03-04 DIAGNOSIS — I10 Essential (primary) hypertension: Secondary | ICD-10-CM | POA: Diagnosis not present

## 2019-03-24 DIAGNOSIS — K2270 Barrett's esophagus without dysplasia: Secondary | ICD-10-CM | POA: Diagnosis not present

## 2019-03-24 DIAGNOSIS — K219 Gastro-esophageal reflux disease without esophagitis: Secondary | ICD-10-CM | POA: Diagnosis not present

## 2019-03-30 DIAGNOSIS — N6320 Unspecified lump in the left breast, unspecified quadrant: Secondary | ICD-10-CM | POA: Diagnosis not present

## 2019-03-30 DIAGNOSIS — Z1331 Encounter for screening for depression: Secondary | ICD-10-CM | POA: Diagnosis not present

## 2019-03-30 DIAGNOSIS — Z9181 History of falling: Secondary | ICD-10-CM | POA: Diagnosis not present

## 2019-04-06 DIAGNOSIS — K22719 Barrett's esophagus with dysplasia, unspecified: Secondary | ICD-10-CM | POA: Diagnosis not present

## 2019-04-06 DIAGNOSIS — Z8719 Personal history of other diseases of the digestive system: Secondary | ICD-10-CM | POA: Diagnosis not present

## 2019-04-06 DIAGNOSIS — K229 Disease of esophagus, unspecified: Secondary | ICD-10-CM | POA: Diagnosis not present

## 2019-04-06 DIAGNOSIS — K219 Gastro-esophageal reflux disease without esophagitis: Secondary | ICD-10-CM | POA: Diagnosis not present

## 2019-05-17 DIAGNOSIS — R0600 Dyspnea, unspecified: Secondary | ICD-10-CM | POA: Diagnosis not present

## 2019-05-17 DIAGNOSIS — T783XXA Angioneurotic edema, initial encounter: Secondary | ICD-10-CM | POA: Diagnosis not present

## 2019-05-17 DIAGNOSIS — R0689 Other abnormalities of breathing: Secondary | ICD-10-CM | POA: Diagnosis not present

## 2019-05-27 DIAGNOSIS — G894 Chronic pain syndrome: Secondary | ICD-10-CM | POA: Diagnosis not present

## 2019-05-27 DIAGNOSIS — M47816 Spondylosis without myelopathy or radiculopathy, lumbar region: Secondary | ICD-10-CM | POA: Diagnosis not present

## 2019-05-27 DIAGNOSIS — M7918 Myalgia, other site: Secondary | ICD-10-CM | POA: Diagnosis not present

## 2019-05-28 DIAGNOSIS — Z9181 History of falling: Secondary | ICD-10-CM | POA: Diagnosis not present

## 2019-05-28 DIAGNOSIS — Z1331 Encounter for screening for depression: Secondary | ICD-10-CM | POA: Diagnosis not present

## 2019-05-28 DIAGNOSIS — Z87898 Personal history of other specified conditions: Secondary | ICD-10-CM | POA: Diagnosis not present

## 2019-05-28 DIAGNOSIS — Z6832 Body mass index (BMI) 32.0-32.9, adult: Secondary | ICD-10-CM | POA: Diagnosis not present

## 2019-05-28 DIAGNOSIS — I87323 Chronic venous hypertension (idiopathic) with inflammation of bilateral lower extremity: Secondary | ICD-10-CM | POA: Diagnosis not present

## 2019-05-28 DIAGNOSIS — R61 Generalized hyperhidrosis: Secondary | ICD-10-CM | POA: Diagnosis not present

## 2019-05-28 DIAGNOSIS — R030 Elevated blood-pressure reading, without diagnosis of hypertension: Secondary | ICD-10-CM | POA: Diagnosis not present

## 2019-06-01 DIAGNOSIS — Z1231 Encounter for screening mammogram for malignant neoplasm of breast: Secondary | ICD-10-CM | POA: Diagnosis not present

## 2019-06-01 DIAGNOSIS — Z01419 Encounter for gynecological examination (general) (routine) without abnormal findings: Secondary | ICD-10-CM | POA: Diagnosis not present

## 2019-06-01 DIAGNOSIS — N958 Other specified menopausal and perimenopausal disorders: Secondary | ICD-10-CM | POA: Diagnosis not present

## 2019-06-01 DIAGNOSIS — Z6832 Body mass index (BMI) 32.0-32.9, adult: Secondary | ICD-10-CM | POA: Diagnosis not present

## 2019-06-01 DIAGNOSIS — M8588 Other specified disorders of bone density and structure, other site: Secondary | ICD-10-CM | POA: Diagnosis not present

## 2019-06-07 ENCOUNTER — Ambulatory Visit: Payer: Medicare Other | Admitting: Cardiology

## 2019-06-07 NOTE — Progress Notes (Signed)
Cardiology Office Note:    Date:  06/08/2019   ID:  Molly Hogan, DOB Sep 09, 1950, MRN [id number]  PCP:  No primary care provider on file.  Cardiologist:  Norman Herrlich, MD    Referring MD: [facility]   ASSESSMENT:    1. Deep vein thrombophlebitis of calf, left (HCC)   2. Leg swelling   3. Shortness of breath    PLAN:    In order of problems listed above:  1. Improved anticoagulants were withdrawn by vascular surgery I told her my opinion she is an absolute contraindication for hormone replacement therapy and send a copy of my note to her gynecologist Dr. Jennette Kettle. 2. He has chronic venous insufficiency continue support hose 3. Resolved echocardiogram CTA proBNP level did not show findings of heart failure   Next appointment: As needed   Medication Adjustments/Labs and Tests Ordered: Current medicines are reviewed at length with the patient today.  Concerns regarding medicines are outlined above.  No orders of the defined types were placed in this encounter.  No orders of the defined types were placed in this encounter.   No chief complaint on file.   History of Present Illness:    Molly Hogan is a 69 y.o. female with a hx of DVT of her LLE last seen 12/23/2018. Compliance with diet, lifestyle and medications: Yes  Reviewed the testing below with the patient  CTA chest performed at [facility] 12/25/2018 showed left breast mass and no evidence of pulmonary embolism.  An echocardiogram performed 12/28/2018 showed normal left and right ventricular function no evidence for right ventricular pressure overload or pulmonary artery hypertension or significant valvular dysfunction.  At that time with infrapopliteal stable DVT without progression on serial imaging her hormone replacement therapy was withdrawn gabapentin was discontinued because edema he was to follow-up in the office in 4 weeks after testing which did not occur under imaging she had MRI of  the breast bilaterally 04/13/2013 which was unremarkable.  Since then she has had bowel obstruction and surgery and a 21-day hospital stay at [facility]  She relates that she had a mammography in [place] and had an ultrasound of the breast at [facility] was told she had a cyst.  She is followed up with vascular in [facility] Dr. Jimmie Molly and was withdrawn from her anticoagulant she has no more calf pain but does have chronic edema.  Her biggest concern is that she is off hormone replacement with deep vein thrombosis is having night sweats and hand carries a note to me that the benefits of estrogen replacement.  I told her in my opinion is absolutely contraindicated with deep vein thrombosis.  Encourage her to contact her gynecologist to explore other options for treating her symptoms.  Lipids are followed with her primary care physician most recently 09/02/1998 cholesterol 197 HDL 72 LDL at target 97.  He has faint coronary artery calcification not uncommon in her age is not having chest discomfort I do not think she needs an ischemia evaluation.  If her LDL exceeded 100 in the future I would put her on a statin.  She wears support hose that is helped with her lower extremity edema she will continue it and not can place her on a diuretic.  She tells me she checks her blood pressure at different times of the day different activities repeat by me is 136/78 and I told her I would not place her on antihypertensive agents instructed her how to check  blood pressure at home and asked her to contact me if it is consistently greater than 191 systolic.  She prefers to follow-up with her primary care physician Past Medical History:  Diagnosis Date  . Pneumonia   . Thyroid nodule     Past Surgical History:  Procedure Laterality Date  . ABDOMINAL HYSTERECTOMY  1992  . ANKLE FRACTURE SURGERY  2000  . CHOLECYSTECTOMY    . GALLBLADDER SURGERY  2005  . gastric volulus  2012  . LITHOTRIPSY    .  MENISCUS REPAIR  2013  . TONSILLECTOMY  1972  . TOTAL KNEE ARTHROPLASTY Left     Current Medications: No outpatient medications have been marked as taking for the 06/08/19 encounter (Appointment) with Richardo Priest, MD.     Allergies:   Patient has no known allergies.   Social History   Socioeconomic History  . Marital status: Married    Spouse name: Not on file  . Number of children: Not on file  . Years of education: Not on file  . Highest education level: Not on file  Occupational History  . Not on file  Tobacco Use  . Smoking status: Never Smoker  . Smokeless tobacco: Never Used  Substance and Sexual Activity  . Alcohol use: No  . Drug use: No  . Sexual activity: Not on file  Other Topics Concern  . Not on file  Social History Narrative  . Not on file   Social Determinants of Health   Financial Resource Strain:   . Difficulty of Paying Living Expenses: Not on file  Food Insecurity:   . Worried About Charity fundraiser in the Last Year: Not on file  . Ran Out of Food in the Last Year: Not on file  Transportation Needs:   . Lack of Transportation (Medical): Not on file  . Lack of Transportation (Non-Medical): Not on file  Physical Activity:   . Days of Exercise per Week: Not on file  . Minutes of Exercise per Session: Not on file  Stress:   . Feeling of Stress : Not on file  Social Connections:   . Frequency of Communication with Friends and Family: Not on file  . Frequency of Social Gatherings with Friends and Family: Not on file  . Attends Religious Services: Not on file  . Active Member of Clubs or Organizations: Not on file  . Attends Archivist Meetings: Not on file  . Marital Status: Not on file     Family History: The patient's family history includes Cancer in her mother. ROS:   Please see the history of present illness.    All other systems reviewed and are negative.  EKGs/Labs/Other Studies Reviewed:    The following studies were  reviewed today:   EKG 12/23/2018 independently reviewed sinus rhythm it was normal. Recent Labs:   Ref Range & Units 5 mo ago  D-DIMER 0.00 - 0.49 mg/L FEU 0.81    12/23/2018: BUN 17; Creatinine, Ser 0.64; NT-Pro BNP 121; Potassium 4.4; Sodium 142  Recent Lipid Panel No results found for: CHOL, TRIG, HDL, CHOLHDL, VLDL, LDLCALC, LDLDIRECT  Physical Exam:    VS:  There were no vitals taken for this visit.    Wt Readings from Last 3 Encounters:  12/23/18 183 lb 9.6 oz (83.3 kg)  04/15/18 182 lb (82.6 kg)  05/17/13 197 lb 3.2 oz (89.4 kg)     GEN:  Well nourished, well developed in no acute distress HEENT: Normal  NECK: No JVD; No carotid bruits LYMPHATICS: No lymphadenopathy CARDIAC: RRR, no murmurs, rubs, gallops RESPIRATORY:  Clear to auscultation without rales, wheezing or rhonchi  ABDOMEN: Soft, non-tender, non-distended MUSCULOSKELETAL: She has 1-2+ lower extremity edema predominantly about the ankle pitting a cyst changes left leg edema; No deformity  SKIN: Warm and dry NEUROLOGIC:  Alert and oriented x 3 PSYCHIATRIC:  Normal affect    Signed, Norman Herrlich, MD  06/08/2019 8:07 AM    [facility]

## 2019-06-08 ENCOUNTER — Other Ambulatory Visit: Payer: Self-pay

## 2019-06-08 ENCOUNTER — Ambulatory Visit (INDEPENDENT_AMBULATORY_CARE_PROVIDER_SITE_OTHER): Payer: Medicare Other | Admitting: Cardiology

## 2019-06-08 ENCOUNTER — Encounter: Payer: Self-pay | Admitting: Cardiology

## 2019-06-08 VITALS — BP 118/84 | HR 85 | Ht 63.0 in | Wt 179.2 lb

## 2019-06-08 DIAGNOSIS — N63 Unspecified lump in unspecified breast: Secondary | ICD-10-CM | POA: Diagnosis not present

## 2019-06-08 DIAGNOSIS — I80202 Phlebitis and thrombophlebitis of unspecified deep vessels of left lower extremity: Secondary | ICD-10-CM | POA: Diagnosis not present

## 2019-06-08 DIAGNOSIS — R0602 Shortness of breath: Secondary | ICD-10-CM

## 2019-06-08 DIAGNOSIS — M7989 Other specified soft tissue disorders: Secondary | ICD-10-CM | POA: Diagnosis not present

## 2019-06-08 DIAGNOSIS — I2510 Atherosclerotic heart disease of native coronary artery without angina pectoris: Secondary | ICD-10-CM

## 2019-06-08 NOTE — Patient Instructions (Signed)
Medication Instructions:  Your physician recommends that you continue on your current medications as directed. Please refer to the Current Medication list given to you today.  *If you need a refill on your cardiac medications before your next appointment, please call your pharmacy*  Lab Work: NONE If you have labs (blood work) drawn today and your tests are completely normal, you will receive your results only by: Marland Kitchen MyChart Message (if you have MyChart) OR . A paper copy in the mail If you have any lab test that is abnormal or we need to change your treatment, we will call you to review the results.  Testing/Procedures: Your physician has requested that you regularly monitor and record your blood pressure readings at home. Please use the same machine at the same time of day to check your readings and record them to bring to your follow-up visit.    Follow-Up: At [facility], you and your health needs are our priority.  As part of our continuing mission to provide you with exceptional heart care, we have created designated Provider Care Teams.  These Care Teams include your primary Cardiologist (physician) and Advanced Practice Providers (APPs -  Physician Assistants and Nurse Practitioners) who all work together to provide you with the care you need, when you need it.  Your next appointment:   AS needed in future  The format for your next appointment:   In Person  Provider:   Norman Herrlich, MD

## 2019-06-16 DIAGNOSIS — I8311 Varicose veins of right lower extremity with inflammation: Secondary | ICD-10-CM | POA: Diagnosis not present

## 2019-06-16 DIAGNOSIS — I8312 Varicose veins of left lower extremity with inflammation: Secondary | ICD-10-CM | POA: Diagnosis not present

## 2019-06-25 DIAGNOSIS — I8311 Varicose veins of right lower extremity with inflammation: Secondary | ICD-10-CM | POA: Diagnosis not present

## 2019-07-01 DIAGNOSIS — H2513 Age-related nuclear cataract, bilateral: Secondary | ICD-10-CM | POA: Diagnosis not present

## 2019-07-06 DIAGNOSIS — K5900 Constipation, unspecified: Secondary | ICD-10-CM | POA: Diagnosis not present

## 2019-07-06 DIAGNOSIS — R109 Unspecified abdominal pain: Secondary | ICD-10-CM | POA: Diagnosis not present

## 2019-07-06 DIAGNOSIS — Z8719 Personal history of other diseases of the digestive system: Secondary | ICD-10-CM | POA: Diagnosis not present

## 2019-07-06 DIAGNOSIS — R140 Abdominal distension (gaseous): Secondary | ICD-10-CM | POA: Diagnosis not present

## 2019-07-13 DIAGNOSIS — I8311 Varicose veins of right lower extremity with inflammation: Secondary | ICD-10-CM | POA: Diagnosis not present

## 2019-07-15 DIAGNOSIS — I8311 Varicose veins of right lower extremity with inflammation: Secondary | ICD-10-CM | POA: Diagnosis not present

## 2019-07-30 DIAGNOSIS — I8311 Varicose veins of right lower extremity with inflammation: Secondary | ICD-10-CM | POA: Diagnosis not present

## 2019-08-17 DIAGNOSIS — M7981 Nontraumatic hematoma of soft tissue: Secondary | ICD-10-CM | POA: Diagnosis not present

## 2019-08-17 DIAGNOSIS — I8311 Varicose veins of right lower extremity with inflammation: Secondary | ICD-10-CM | POA: Diagnosis not present

## 2019-08-25 DIAGNOSIS — I10 Essential (primary) hypertension: Secondary | ICD-10-CM | POA: Diagnosis not present

## 2019-08-25 DIAGNOSIS — Z6836 Body mass index (BMI) 36.0-36.9, adult: Secondary | ICD-10-CM | POA: Diagnosis not present

## 2019-08-25 DIAGNOSIS — G894 Chronic pain syndrome: Secondary | ICD-10-CM | POA: Diagnosis not present

## 2019-08-25 DIAGNOSIS — M7918 Myalgia, other site: Secondary | ICD-10-CM | POA: Diagnosis not present

## 2019-08-25 DIAGNOSIS — M545 Low back pain: Secondary | ICD-10-CM | POA: Diagnosis not present

## 2019-09-03 DIAGNOSIS — M7981 Nontraumatic hematoma of soft tissue: Secondary | ICD-10-CM | POA: Diagnosis not present

## 2019-09-03 DIAGNOSIS — I8311 Varicose veins of right lower extremity with inflammation: Secondary | ICD-10-CM | POA: Diagnosis not present

## 2019-09-09 DIAGNOSIS — Z780 Asymptomatic menopausal state: Secondary | ICD-10-CM | POA: Diagnosis not present

## 2019-09-09 DIAGNOSIS — I87323 Chronic venous hypertension (idiopathic) with inflammation of bilateral lower extremity: Secondary | ICD-10-CM | POA: Diagnosis not present

## 2019-09-09 DIAGNOSIS — K2270 Barrett's esophagus without dysplasia: Secondary | ICD-10-CM | POA: Diagnosis not present

## 2019-09-09 DIAGNOSIS — R635 Abnormal weight gain: Secondary | ICD-10-CM | POA: Diagnosis not present

## 2019-09-09 DIAGNOSIS — Z0000 Encounter for general adult medical examination without abnormal findings: Secondary | ICD-10-CM | POA: Diagnosis not present

## 2019-09-09 DIAGNOSIS — R0600 Dyspnea, unspecified: Secondary | ICD-10-CM | POA: Diagnosis not present

## 2019-09-09 DIAGNOSIS — Z79899 Other long term (current) drug therapy: Secondary | ICD-10-CM | POA: Diagnosis not present

## 2019-09-09 DIAGNOSIS — E785 Hyperlipidemia, unspecified: Secondary | ICD-10-CM | POA: Diagnosis not present

## 2019-09-17 DIAGNOSIS — Z131 Encounter for screening for diabetes mellitus: Secondary | ICD-10-CM | POA: Diagnosis not present

## 2019-09-17 DIAGNOSIS — N390 Urinary tract infection, site not specified: Secondary | ICD-10-CM | POA: Diagnosis not present

## 2019-09-17 DIAGNOSIS — R635 Abnormal weight gain: Secondary | ICD-10-CM | POA: Diagnosis not present

## 2019-09-17 DIAGNOSIS — R0600 Dyspnea, unspecified: Secondary | ICD-10-CM | POA: Diagnosis not present

## 2019-09-17 DIAGNOSIS — Z0000 Encounter for general adult medical examination without abnormal findings: Secondary | ICD-10-CM | POA: Diagnosis not present

## 2019-09-17 DIAGNOSIS — E669 Obesity, unspecified: Secondary | ICD-10-CM | POA: Diagnosis not present

## 2019-09-21 DIAGNOSIS — M8580 Other specified disorders of bone density and structure, unspecified site: Secondary | ICD-10-CM | POA: Diagnosis not present

## 2019-09-21 DIAGNOSIS — Z780 Asymptomatic menopausal state: Secondary | ICD-10-CM | POA: Diagnosis not present

## 2019-09-22 DIAGNOSIS — M7981 Nontraumatic hematoma of soft tissue: Secondary | ICD-10-CM | POA: Diagnosis not present

## 2019-09-22 DIAGNOSIS — I8311 Varicose veins of right lower extremity with inflammation: Secondary | ICD-10-CM | POA: Diagnosis not present

## 2019-10-05 DIAGNOSIS — J3489 Other specified disorders of nose and nasal sinuses: Secondary | ICD-10-CM | POA: Diagnosis not present

## 2019-10-05 DIAGNOSIS — Z7982 Long term (current) use of aspirin: Secondary | ICD-10-CM | POA: Diagnosis not present

## 2019-10-05 DIAGNOSIS — J343 Hypertrophy of nasal turbinates: Secondary | ICD-10-CM | POA: Diagnosis not present

## 2019-10-05 DIAGNOSIS — J342 Deviated nasal septum: Secondary | ICD-10-CM | POA: Diagnosis not present

## 2019-10-05 DIAGNOSIS — R0981 Nasal congestion: Secondary | ICD-10-CM | POA: Diagnosis not present

## 2019-10-12 DIAGNOSIS — R221 Localized swelling, mass and lump, neck: Secondary | ICD-10-CM | POA: Diagnosis not present

## 2019-10-12 DIAGNOSIS — E669 Obesity, unspecified: Secondary | ICD-10-CM | POA: Diagnosis not present

## 2019-10-12 DIAGNOSIS — M8580 Other specified disorders of bone density and structure, unspecified site: Secondary | ICD-10-CM | POA: Diagnosis not present

## 2019-10-12 DIAGNOSIS — F39 Unspecified mood [affective] disorder: Secondary | ICD-10-CM | POA: Diagnosis not present

## 2019-11-24 DIAGNOSIS — Z6836 Body mass index (BMI) 36.0-36.9, adult: Secondary | ICD-10-CM | POA: Diagnosis not present

## 2019-11-24 DIAGNOSIS — M791 Myalgia, unspecified site: Secondary | ICD-10-CM | POA: Diagnosis not present

## 2019-11-24 DIAGNOSIS — G894 Chronic pain syndrome: Secondary | ICD-10-CM | POA: Diagnosis not present

## 2019-11-24 DIAGNOSIS — M545 Low back pain: Secondary | ICD-10-CM | POA: Diagnosis not present

## 2019-12-14 DIAGNOSIS — R5381 Other malaise: Secondary | ICD-10-CM | POA: Diagnosis not present

## 2019-12-14 DIAGNOSIS — R030 Elevated blood-pressure reading, without diagnosis of hypertension: Secondary | ICD-10-CM | POA: Diagnosis not present

## 2019-12-14 DIAGNOSIS — E559 Vitamin D deficiency, unspecified: Secondary | ICD-10-CM | POA: Diagnosis not present

## 2019-12-14 DIAGNOSIS — Z79899 Other long term (current) drug therapy: Secondary | ICD-10-CM | POA: Diagnosis not present

## 2019-12-14 DIAGNOSIS — R5383 Other fatigue: Secondary | ICD-10-CM | POA: Diagnosis not present

## 2019-12-21 DIAGNOSIS — I517 Cardiomegaly: Secondary | ICD-10-CM | POA: Diagnosis not present

## 2019-12-21 DIAGNOSIS — I259 Chronic ischemic heart disease, unspecified: Secondary | ICD-10-CM | POA: Diagnosis not present

## 2020-02-23 DIAGNOSIS — M4125 Other idiopathic scoliosis, thoracolumbar region: Secondary | ICD-10-CM | POA: Diagnosis not present

## 2020-02-23 DIAGNOSIS — M545 Low back pain, unspecified: Secondary | ICD-10-CM | POA: Diagnosis not present

## 2020-02-23 DIAGNOSIS — G894 Chronic pain syndrome: Secondary | ICD-10-CM | POA: Diagnosis not present

## 2020-02-23 DIAGNOSIS — M7918 Myalgia, other site: Secondary | ICD-10-CM | POA: Diagnosis not present

## 2020-03-02 DIAGNOSIS — Z23 Encounter for immunization: Secondary | ICD-10-CM | POA: Diagnosis not present

## 2020-03-29 DIAGNOSIS — S32592A Other specified fracture of left pubis, initial encounter for closed fracture: Secondary | ICD-10-CM | POA: Diagnosis not present

## 2020-03-29 DIAGNOSIS — S32512A Fracture of superior rim of left pubis, initial encounter for closed fracture: Secondary | ICD-10-CM | POA: Diagnosis not present

## 2020-03-29 DIAGNOSIS — N200 Calculus of kidney: Secondary | ICD-10-CM | POA: Diagnosis not present

## 2020-03-29 DIAGNOSIS — R82998 Other abnormal findings in urine: Secondary | ICD-10-CM | POA: Diagnosis not present

## 2020-03-29 DIAGNOSIS — M545 Low back pain, unspecified: Secondary | ICD-10-CM | POA: Diagnosis not present

## 2020-03-29 DIAGNOSIS — R001 Bradycardia, unspecified: Secondary | ICD-10-CM | POA: Diagnosis not present

## 2020-03-29 DIAGNOSIS — K449 Diaphragmatic hernia without obstruction or gangrene: Secondary | ICD-10-CM | POA: Diagnosis not present

## 2020-03-31 DIAGNOSIS — S32592A Other specified fracture of left pubis, initial encounter for closed fracture: Secondary | ICD-10-CM | POA: Diagnosis not present

## 2020-03-31 DIAGNOSIS — S32512A Fracture of superior rim of left pubis, initial encounter for closed fracture: Secondary | ICD-10-CM | POA: Diagnosis not present

## 2020-03-31 DIAGNOSIS — M549 Dorsalgia, unspecified: Secondary | ICD-10-CM | POA: Diagnosis not present

## 2020-03-31 DIAGNOSIS — N201 Calculus of ureter: Secondary | ICD-10-CM | POA: Diagnosis not present

## 2020-03-31 DIAGNOSIS — I10 Essential (primary) hypertension: Secondary | ICD-10-CM | POA: Diagnosis not present

## 2020-03-31 DIAGNOSIS — R0602 Shortness of breath: Secondary | ICD-10-CM | POA: Diagnosis not present

## 2020-03-31 DIAGNOSIS — R109 Unspecified abdominal pain: Secondary | ICD-10-CM | POA: Diagnosis not present

## 2020-03-31 DIAGNOSIS — N132 Hydronephrosis with renal and ureteral calculous obstruction: Secondary | ICD-10-CM | POA: Diagnosis not present

## 2020-03-31 DIAGNOSIS — M545 Low back pain, unspecified: Secondary | ICD-10-CM | POA: Diagnosis not present

## 2020-03-31 DIAGNOSIS — R0902 Hypoxemia: Secondary | ICD-10-CM | POA: Diagnosis not present

## 2020-03-31 DIAGNOSIS — K6389 Other specified diseases of intestine: Secondary | ICD-10-CM | POA: Diagnosis not present

## 2020-03-31 DIAGNOSIS — R52 Pain, unspecified: Secondary | ICD-10-CM | POA: Diagnosis not present

## 2020-04-04 DIAGNOSIS — Z6834 Body mass index (BMI) 34.0-34.9, adult: Secondary | ICD-10-CM | POA: Diagnosis not present

## 2020-04-04 DIAGNOSIS — N200 Calculus of kidney: Secondary | ICD-10-CM | POA: Diagnosis not present

## 2020-04-04 DIAGNOSIS — E669 Obesity, unspecified: Secondary | ICD-10-CM | POA: Diagnosis not present

## 2020-04-13 DIAGNOSIS — Z23 Encounter for immunization: Secondary | ICD-10-CM | POA: Diagnosis not present

## 2020-04-14 DIAGNOSIS — K6389 Other specified diseases of intestine: Secondary | ICD-10-CM | POA: Diagnosis not present

## 2020-04-14 DIAGNOSIS — N1330 Unspecified hydronephrosis: Secondary | ICD-10-CM | POA: Diagnosis not present

## 2020-04-14 DIAGNOSIS — Z79899 Other long term (current) drug therapy: Secondary | ICD-10-CM | POA: Diagnosis not present

## 2020-04-14 DIAGNOSIS — N200 Calculus of kidney: Secondary | ICD-10-CM | POA: Diagnosis not present

## 2020-04-14 DIAGNOSIS — Z87442 Personal history of urinary calculi: Secondary | ICD-10-CM | POA: Diagnosis not present

## 2020-04-14 DIAGNOSIS — N201 Calculus of ureter: Secondary | ICD-10-CM | POA: Diagnosis not present

## 2020-04-17 DIAGNOSIS — R001 Bradycardia, unspecified: Secondary | ICD-10-CM | POA: Diagnosis not present

## 2020-04-19 DIAGNOSIS — Z6834 Body mass index (BMI) 34.0-34.9, adult: Secondary | ICD-10-CM | POA: Diagnosis not present

## 2020-04-19 DIAGNOSIS — E669 Obesity, unspecified: Secondary | ICD-10-CM | POA: Diagnosis not present

## 2020-04-19 DIAGNOSIS — N200 Calculus of kidney: Secondary | ICD-10-CM | POA: Diagnosis not present

## 2020-04-23 DIAGNOSIS — S81801A Unspecified open wound, right lower leg, initial encounter: Secondary | ICD-10-CM | POA: Diagnosis not present

## 2020-05-01 DIAGNOSIS — S81801A Unspecified open wound, right lower leg, initial encounter: Secondary | ICD-10-CM | POA: Diagnosis not present

## 2020-05-02 DIAGNOSIS — R109 Unspecified abdominal pain: Secondary | ICD-10-CM | POA: Diagnosis not present

## 2020-05-02 DIAGNOSIS — N200 Calculus of kidney: Secondary | ICD-10-CM | POA: Diagnosis not present

## 2020-05-23 DIAGNOSIS — E041 Nontoxic single thyroid nodule: Secondary | ICD-10-CM | POA: Insufficient documentation

## 2020-05-23 DIAGNOSIS — Z6834 Body mass index (BMI) 34.0-34.9, adult: Secondary | ICD-10-CM | POA: Diagnosis not present

## 2020-05-23 DIAGNOSIS — I87323 Chronic venous hypertension (idiopathic) with inflammation of bilateral lower extremity: Secondary | ICD-10-CM | POA: Diagnosis not present

## 2020-05-23 DIAGNOSIS — J189 Pneumonia, unspecified organism: Secondary | ICD-10-CM | POA: Insufficient documentation

## 2020-05-23 DIAGNOSIS — L03119 Cellulitis of unspecified part of limb: Secondary | ICD-10-CM | POA: Diagnosis not present

## 2020-05-24 DIAGNOSIS — G894 Chronic pain syndrome: Secondary | ICD-10-CM | POA: Diagnosis not present

## 2020-05-24 DIAGNOSIS — M791 Myalgia, unspecified site: Secondary | ICD-10-CM | POA: Diagnosis not present

## 2020-05-24 DIAGNOSIS — M545 Low back pain, unspecified: Secondary | ICD-10-CM | POA: Diagnosis not present

## 2020-05-24 DIAGNOSIS — M4125 Other idiopathic scoliosis, thoracolumbar region: Secondary | ICD-10-CM | POA: Diagnosis not present

## 2020-05-26 ENCOUNTER — Encounter: Payer: Self-pay | Admitting: Cardiology

## 2020-05-26 DIAGNOSIS — M8580 Other specified disorders of bone density and structure, unspecified site: Secondary | ICD-10-CM | POA: Insufficient documentation

## 2020-05-26 DIAGNOSIS — K915 Postcholecystectomy syndrome: Secondary | ICD-10-CM | POA: Insufficient documentation

## 2020-05-26 DIAGNOSIS — E669 Obesity, unspecified: Secondary | ICD-10-CM

## 2020-05-26 DIAGNOSIS — K2270 Barrett's esophagus without dysplasia: Secondary | ICD-10-CM | POA: Insufficient documentation

## 2020-05-26 DIAGNOSIS — E559 Vitamin D deficiency, unspecified: Secondary | ICD-10-CM | POA: Insufficient documentation

## 2020-05-26 DIAGNOSIS — N200 Calculus of kidney: Secondary | ICD-10-CM | POA: Insufficient documentation

## 2020-05-26 DIAGNOSIS — I87323 Chronic venous hypertension (idiopathic) with inflammation of bilateral lower extremity: Secondary | ICD-10-CM | POA: Insufficient documentation

## 2020-05-31 DIAGNOSIS — K22719 Barrett's esophagus with dysplasia, unspecified: Secondary | ICD-10-CM | POA: Diagnosis not present

## 2020-05-31 DIAGNOSIS — K5900 Constipation, unspecified: Secondary | ICD-10-CM | POA: Diagnosis not present

## 2020-05-31 DIAGNOSIS — K219 Gastro-esophageal reflux disease without esophagitis: Secondary | ICD-10-CM | POA: Diagnosis not present

## 2020-06-01 ENCOUNTER — Ambulatory Visit (INDEPENDENT_AMBULATORY_CARE_PROVIDER_SITE_OTHER): Payer: Medicare Other | Admitting: Cardiology

## 2020-06-01 ENCOUNTER — Other Ambulatory Visit: Payer: Self-pay

## 2020-06-01 ENCOUNTER — Encounter: Payer: Self-pay | Admitting: Cardiology

## 2020-06-01 VITALS — BP 130/80 | HR 76 | Ht 62.0 in | Wt 187.2 lb

## 2020-06-01 DIAGNOSIS — R0602 Shortness of breath: Secondary | ICD-10-CM

## 2020-06-01 DIAGNOSIS — M7989 Other specified soft tissue disorders: Secondary | ICD-10-CM

## 2020-06-01 DIAGNOSIS — I471 Supraventricular tachycardia: Secondary | ICD-10-CM | POA: Diagnosis not present

## 2020-06-01 DIAGNOSIS — I80202 Phlebitis and thrombophlebitis of unspecified deep vessels of left lower extremity: Secondary | ICD-10-CM

## 2020-06-01 DIAGNOSIS — I4719 Other supraventricular tachycardia: Secondary | ICD-10-CM

## 2020-06-01 NOTE — Progress Notes (Signed)
Cardiology Office Note:    Date:  06/01/2020   ID:  SITA MANGEN, DOB 1950/06/20, MRN [id number]  PCP:  [facility]  Cardiologist:  Norman Herrlich, MD   Referring MD: Vertell Novak*  ASSESSMENT:    1. Shortness of breath   2. Leg swelling   3. Paroxysmal atrial tachycardia (HCC)   4. Deep vein thrombophlebitis of calf, left (HCC)    PLAN:    In order of problems listed above:  1. This is a complex visit the trigger is an abnormal event monitor however overall it ventricular and supraventricular ectopy is rare and the episodes are brief and asymptomatic.  I do not think it is related to her peripheral edema and shortness of breath and I do not think that she would benefit from antiarrhythmic drug therapy.  Unfortunately these findings are not unusual in normal individuals on extended event monitors. 2. Previously evaluated for shortness of breath and leg swelling with no evidence of heart failure I asked her to repeat a proBNP level increase her diuretic to daily 20 mg furosemide.  Unfortunately her edema and shortness of breath as a consequence of previous venous thromboembolism 3. She is no longer anticoagulated and is followed and managed by the vein program in [place].  Next appointment 3 months   Medication Adjustments/Labs and Tests Ordered: Current medicines are reviewed at length with the patient today.  Concerns regarding medicines are outlined above.  Orders Placed This Encounter  Procedures  . EKG 12-Lead   No orders of the defined types were placed in this encounter.    No chief complaint on file.   History of Present Illness:    Molly Hogan is a 70 y.o. female who is being seen today for the evaluation of abnormal event monitor at the request of Maurie Boettcher T, F*. She was seen in our office most recently February 2021 have a thrombophlebitis left lower extremity and chronic lower extremity edema. Evaluation 2020  did not show findings of heart failure. She has no longer anticoagulated. I reviewed the admit event monitor was performed for 7 days beginning 03/29/2020 for bradycardia.  Ventricular ectopy was rare supraventricular ectopy rare and there was 1 4 beat run of PVCs present And 12 brief runs of atrial premature contractions the longest 11 complexes at a rate of 141 bpm.  There were no episodes of atrial fibrillation or flutter.  These events were a symptomatic There is an EKG from 04/28/2020 I independently reviewed showing sinus rhythm and is normal. There is an echocardiogram from 12/21/2019 performed for malaise which showed mild concentric LVH normal left ventricular function EF 60 to 65% normal diastolic filling pressure and otherwise normal.   She wants seen in the emergency room at [facility] 25 2021 with back pain see was normal BMP showed a potassium of 3.8 D-dimer was performed it was low she had a troponin assessed that was undetectable she was diagnosed with a left ureteral stone.  CT scan of the abdomen and pelvis was performed with contrast confirming the stone.  In the EKG from that date showed sinus rhythm and was normal Past Medical History:  Diagnosis Date  . Barrett esophagus   . Class 1 obesity   . Leg swelling 12/23/2018  . Multiple thyroid nodules 07/08/2011  . Osteopenia   . Pneumonia   . Post-cholecystectomy syndrome   . Recurrent nephrolithiasis   . Shortness of breath 12/23/2018  . Stasis edema  with inflammation, bilateral   . Thyroid nodule   . Vitamin D deficiency     Past Surgical History:  Procedure Laterality Date  . ABDOMINAL HYSTERECTOMY  1992  . ANKLE FRACTURE SURGERY  2000  . CHOLECYSTECTOMY    . GALLBLADDER SURGERY  2005  . gastric volulus  2012  . LITHOTRIPSY    . MENISCUS REPAIR  2013  . TONSILLECTOMY  1972  . TOTAL KNEE ARTHROPLASTY Left     Current Medications: Current Meds  Medication Sig  . acetaminophen (TYLENOL) 500 MG tablet Take  500 mg by mouth every 6 (six) hours as needed.  Marland Kitchen aspirin EC 81 MG tablet Take 81 mg by mouth daily.  . baclofen (LIORESAL) 10 MG tablet TAKE 1 TABLET BY MOUTH THREE (3) TIMES DAILY AS NEEDED  . Calcium Carbonate-Vitamin D (CALCIUM 600 + D PO) Take 1 tablet by mouth daily.  Tery Sanfilippo Calcium (STOOL SOFTENER PO) Take 2 tablets by mouth daily.  Marland Kitchen doxylamine, Sleep, (SLEEP AID) 25 MG tablet Take 25 mg by mouth at bedtime as needed.  Marland Kitchen estradiol (ESTRACE) 2 MG tablet Take 2 mg by mouth 3 (three) times a week. MON - WED - FRI  . furosemide (LASIX) 20 MG tablet Take 20 mg by mouth daily.  . meloxicam (MOBIC) 15 MG tablet TAKE 1 TABLET BY MOUTH ONCE (1) DAILY WITH FOOD  . Multiple Vitamin (MULTIVITAMIN PO) Take 1 tablet by mouth daily.  Marland Kitchen omeprazole (PRILOSEC) 20 MG capsule Take 20 mg by mouth every morning.  . polyethylene glycol (MIRALAX / GLYCOLAX) 17 g packet Take 17 g by mouth daily.  . Psyllium (METAMUCIL FIBER PO) Take 2 capsules by mouth daily.  . vitamin C (ASCORBIC ACID) 500 MG tablet Take 500 mg by mouth daily.  . Vitamin D, Ergocalciferol, (DRISDOL) 1.25 MG (50000 UT) CAPS capsule TAKE ONE CAPSULE BY MOUTH TWICE WEEKLY  . Wheat Dextrin (BENEFIBER PO) Take 1 tablet by mouth daily.     Allergies:   Patient has no known allergies.   Social History   Socioeconomic History  . Marital status: Married    Spouse name: Not on file  . Number of children: Not on file  . Years of education: Not on file  . Highest education level: Not on file  Occupational History  . Not on file  Tobacco Use  . Smoking status: Never Smoker  . Smokeless tobacco: Never Used  Vaping Use  . Vaping Use: Never used  Substance and Sexual Activity  . Alcohol use: No  . Drug use: No  . Sexual activity: Not on file  Other Topics Concern  . Not on file  Social History Narrative  . Not on file   Social Determinants of Health   Financial Resource Strain: Not on file  Food Insecurity: Not on file   Transportation Needs: Not on file  Physical Activity: Not on file  Stress: Not on file  Social Connections: Not on file     Family History: The patient's family history includes AAA (abdominal aortic aneurysm) in her mother; Cancer in her mother; Heart disease in her mother.  ROS:   ROS Please see the history of present illness.     All other systems reviewed and are negative.  EKGs/Labs/Other Studies Reviewed:    The following studies were reviewed today:   EKG:  EKG is  ordered today.  The ekg ordered today is personally reviewed and demonstrates sinus rhythm normal  Recent Labs: No results  found for requested labs within last 8760 hours.  Recent Lipid Panel No results found for: CHOL, TRIG, HDL, CHOLHDL, VLDL, LDLCALC, LDLDIRECT  Physical Exam:    VS:  BP 130/80   Pulse 76   Ht  (1.575 m)   Wt 187 lb 3.2 oz (84.9 kg)   SpO2 98%   BMI 34.24 kg/m     Wt Readings from Last 3 Encounters:  06/01/20 187 lb 3.2 oz (84.9 kg)  04/19/20 183 lb (83 kg)  06/08/19 179 lb 3.2 oz (81.3 kg)     GEN:  Well nourished, well developed in no acute distress HEENT: Normal NECK: No JVD; No carotid bruits LYMPHATICS: No lymphadenopathy CARDIAC: RRR, no murmurs, rubs, gallops RESPIRATORY:  Clear to auscultation without rales, wheezing or rhonchi  ABDOMEN: Soft, non-tender, non-distended MUSCULOSKELETAL: She has 2+ lower extremity bilateral pitting edema; No deformity  SKIN: Warm and dry NEUROLOGIC:  Alert and oriented x 3 PSYCHIATRIC:  Normal affect     Signed, Norman Herrlich, MD  06/01/2020 10:37 AM    [facility]

## 2020-06-01 NOTE — Patient Instructions (Signed)
Medication Instructions:  Your physician has recommended you make the following change in your medication:  INCREASE: Furosemide 20 take one tablet by mouth daily.  *If you need a refill on your cardiac medications before your next appointment, please call your pharmacy*   Lab Work: None If you have labs (blood work) drawn today and your tests are completely normal, you will receive your results only by: Marland Kitchen MyChart Message (if you have MyChart) OR . A paper copy in the mail If you have any lab test that is abnormal or we need to change your treatment, we will call you to review the results.   Testing/Procedures: None   Follow-Up: At [facility], you and your health needs are our priority.  As part of our continuing mission to provide you with exceptional heart care, we have created designated Provider Care Teams.  These Care Teams include your primary Cardiologist (physician) and Advanced Practice Providers (APPs -  Physician Assistants and Nurse Practitioners) who all work together to provide you with the care you need, when you need it.  We recommend signing up for the patient portal called "MyChart".  Sign up information is provided on this After Visit Summary.  MyChart is used to connect with patients for Virtual Visits (Telemedicine).  Patients are able to view lab/test results, encounter notes, upcoming appointments, etc.  Non-urgent messages can be sent to your provider as well.   To learn more about what you can do with MyChart, go to [email or web address].    Your next appointment:   3 month(s)  The format for your next appointment:   In Person  Provider:   Norman Herrlich, MD   Other Instructions

## 2020-06-09 DIAGNOSIS — N200 Calculus of kidney: Secondary | ICD-10-CM | POA: Diagnosis not present

## 2020-06-09 DIAGNOSIS — Z9049 Acquired absence of other specified parts of digestive tract: Secondary | ICD-10-CM | POA: Diagnosis not present

## 2020-06-09 DIAGNOSIS — Z87442 Personal history of urinary calculi: Secondary | ICD-10-CM | POA: Diagnosis not present

## 2020-06-09 DIAGNOSIS — N2889 Other specified disorders of kidney and ureter: Secondary | ICD-10-CM | POA: Diagnosis not present

## 2020-06-13 DIAGNOSIS — N200 Calculus of kidney: Secondary | ICD-10-CM | POA: Diagnosis not present

## 2020-06-23 DIAGNOSIS — Z124 Encounter for screening for malignant neoplasm of cervix: Secondary | ICD-10-CM | POA: Diagnosis not present

## 2020-06-23 DIAGNOSIS — Z90710 Acquired absence of both cervix and uterus: Secondary | ICD-10-CM | POA: Diagnosis not present

## 2020-06-23 DIAGNOSIS — Z6834 Body mass index (BMI) 34.0-34.9, adult: Secondary | ICD-10-CM | POA: Diagnosis not present

## 2020-06-23 DIAGNOSIS — Z1231 Encounter for screening mammogram for malignant neoplasm of breast: Secondary | ICD-10-CM | POA: Diagnosis not present

## 2020-06-23 DIAGNOSIS — Z1272 Encounter for screening for malignant neoplasm of vagina: Secondary | ICD-10-CM | POA: Diagnosis not present

## 2020-06-28 DIAGNOSIS — S81801A Unspecified open wound, right lower leg, initial encounter: Secondary | ICD-10-CM | POA: Diagnosis not present

## 2020-06-29 DIAGNOSIS — N200 Calculus of kidney: Secondary | ICD-10-CM | POA: Diagnosis not present

## 2020-06-29 DIAGNOSIS — N390 Urinary tract infection, site not specified: Secondary | ICD-10-CM | POA: Diagnosis not present

## 2020-07-05 ENCOUNTER — Encounter: Payer: Self-pay | Admitting: Cardiology

## 2020-07-05 DIAGNOSIS — Z1159 Encounter for screening for other viral diseases: Secondary | ICD-10-CM | POA: Diagnosis not present

## 2020-07-05 DIAGNOSIS — N200 Calculus of kidney: Secondary | ICD-10-CM | POA: Diagnosis not present

## 2020-07-05 DIAGNOSIS — Z20828 Contact with and (suspected) exposure to other viral communicable diseases: Secondary | ICD-10-CM | POA: Diagnosis not present

## 2020-07-07 DIAGNOSIS — N200 Calculus of kidney: Secondary | ICD-10-CM | POA: Diagnosis not present

## 2020-07-12 DIAGNOSIS — S81801A Unspecified open wound, right lower leg, initial encounter: Secondary | ICD-10-CM | POA: Diagnosis not present

## 2020-07-19 DIAGNOSIS — N200 Calculus of kidney: Secondary | ICD-10-CM | POA: Diagnosis not present

## 2020-07-19 DIAGNOSIS — N2889 Other specified disorders of kidney and ureter: Secondary | ICD-10-CM | POA: Diagnosis not present

## 2020-07-19 DIAGNOSIS — M419 Scoliosis, unspecified: Secondary | ICD-10-CM | POA: Diagnosis not present

## 2020-07-19 DIAGNOSIS — I878 Other specified disorders of veins: Secondary | ICD-10-CM | POA: Diagnosis not present

## 2020-07-19 DIAGNOSIS — N390 Urinary tract infection, site not specified: Secondary | ICD-10-CM | POA: Diagnosis not present

## 2020-07-25 DIAGNOSIS — S81801A Unspecified open wound, right lower leg, initial encounter: Secondary | ICD-10-CM | POA: Diagnosis not present

## 2020-08-16 DIAGNOSIS — S81801A Unspecified open wound, right lower leg, initial encounter: Secondary | ICD-10-CM | POA: Diagnosis not present

## 2020-08-17 DIAGNOSIS — G894 Chronic pain syndrome: Secondary | ICD-10-CM | POA: Diagnosis not present

## 2020-08-17 DIAGNOSIS — M791 Myalgia, unspecified site: Secondary | ICD-10-CM | POA: Diagnosis not present

## 2020-08-17 DIAGNOSIS — M4125 Other idiopathic scoliosis, thoracolumbar region: Secondary | ICD-10-CM | POA: Diagnosis not present

## 2020-08-17 DIAGNOSIS — M545 Low back pain, unspecified: Secondary | ICD-10-CM | POA: Diagnosis not present

## 2020-08-17 DIAGNOSIS — M5106 Intervertebral disc disorders with myelopathy, lumbar region: Secondary | ICD-10-CM | POA: Diagnosis not present
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
## 2020-08-23 ENCOUNTER — Ambulatory Visit (INDEPENDENT_AMBULATORY_CARE_PROVIDER_SITE_OTHER): Payer: Medicare Other | Admitting: Cardiology

## 2020-08-23 ENCOUNTER — Other Ambulatory Visit: Payer: Self-pay

## 2020-08-23 ENCOUNTER — Ambulatory Visit: Payer: Medicare Other | Admitting: Cardiology

## 2020-08-23 ENCOUNTER — Encounter: Payer: Self-pay | Admitting: Cardiology

## 2020-08-23 VITALS — BP 140/78 | HR 78 | Ht 62.0 in | Wt 186.2 lb

## 2020-08-23 DIAGNOSIS — I471 Supraventricular tachycardia: Secondary | ICD-10-CM

## 2020-08-23 DIAGNOSIS — M7989 Other specified soft tissue disorders: Secondary | ICD-10-CM

## 2020-08-23 DIAGNOSIS — R0602 Shortness of breath: Secondary | ICD-10-CM

## 2020-08-23 MED ORDER — TORSEMIDE 20 MG PO TABS: 20.0000 mg | ORAL_TABLET | Freq: Every day | ORAL | 3 refills | Status: DC

## 2020-08-23 NOTE — Patient Instructions (Signed)
Medication Instructions:  Your physician has recommended you make the following change in your medication:  STOP: Furmosemide START: Torsemide 20 mg take one tablet by mouth daily.  *If you need a refill on your cardiac medications before your next appointment, please call your pharmacy*   Lab Work: Your physician recommends that you return for lab work in: 2 weeks BMP, ProBNP If you have labs (blood work) drawn today and your tests are completely normal, you will receive your results only by: Marland Kitchen MyChart Message (if you have MyChart) OR . A paper copy in the mail If you have any lab test that is abnormal or we need to change your treatment, we will call you to review the results.   Testing/Procedures: None   Follow-Up: At [facility], you and your health needs are our priority.  As part of our continuing mission to provide you with exceptional heart care, we have created designated Provider Care Teams.  These Care Teams include your primary Cardiologist (physician) and Advanced Practice Providers (APPs -  Physician Assistants and Nurse Practitioners) who all work together to provide you with the care you need, when you need it.  We recommend signing up for the patient portal called "MyChart".  Sign up information is provided on this After Visit Summary.  MyChart is used to connect with patients for Virtual Visits (Telemedicine).  Patients are able to view lab/test results, encounter notes, upcoming appointments, etc.  Non-urgent messages can be sent to your provider as well.   To learn more about what you can do with MyChart, go to [email or web address].    Your next appointment:   6 month(s)  The format for your next appointment:   In Person  Provider:   Norman Herrlich, MD   Other Instructions

## 2020-08-28 DIAGNOSIS — J3489 Other specified disorders of nose and nasal sinuses: Secondary | ICD-10-CM | POA: Diagnosis not present

## 2020-08-28 DIAGNOSIS — R600 Localized edema: Secondary | ICD-10-CM | POA: Diagnosis not present

## 2020-08-28 DIAGNOSIS — J343 Hypertrophy of nasal turbinates: Secondary | ICD-10-CM | POA: Diagnosis not present

## 2020-08-28 DIAGNOSIS — J342 Deviated nasal septum: Secondary | ICD-10-CM | POA: Diagnosis not present

## 2020-08-28 DIAGNOSIS — R0981 Nasal congestion: Secondary | ICD-10-CM | POA: Diagnosis not present

## 2020-09-14 ENCOUNTER — Other Ambulatory Visit: Payer: Self-pay

## 2020-09-14 DIAGNOSIS — R0602 Shortness of breath: Secondary | ICD-10-CM

## 2020-09-14 DIAGNOSIS — N200 Calculus of kidney: Secondary | ICD-10-CM | POA: Diagnosis not present

## 2020-09-14 DIAGNOSIS — N3289 Other specified disorders of bladder: Secondary | ICD-10-CM | POA: Diagnosis not present

## 2020-09-15 ENCOUNTER — Telehealth: Payer: Self-pay

## 2020-09-15 DIAGNOSIS — H2513 Age-related nuclear cataract, bilateral: Secondary | ICD-10-CM | POA: Diagnosis not present

## 2020-09-15 LAB — BASIC METABOLIC PANEL
BUN/Creatinine Ratio: 32 — ABNORMAL HIGH (ref 12–28)
BUN: 22 mg/dL (ref 8–27)
CO2: 26 mmol/L (ref 20–29)
Calcium: 9.3 mg/dL (ref 8.7–10.3)
Chloride: 105 mmol/L (ref 96–106)
Creatinine, Ser: 0.69 mg/dL (ref 0.57–1.00)
Glucose: 84 mg/dL (ref 65–99)
Potassium: 4.2 mmol/L (ref 3.5–5.2)
Sodium: 143 mmol/L (ref 134–144)
eGFR: 94 mL/min/{1.73_m2} (ref >59)

## 2020-09-15 LAB — PRO B NATRIURETIC PEPTIDE: NT-Pro BNP: 137 pg/mL (ref 0–301)

## 2020-09-15 NOTE — Telephone Encounter (Signed)
Patient is returning call.  °

## 2020-09-15 NOTE — Telephone Encounter (Signed)
Spoke with patient regarding results and recommendation.  Patient verbalizes understanding and is agreeable to plan of care. Advised patient to call back with any issues or concerns.  

## 2020-09-15 NOTE — Telephone Encounter (Signed)
-----   Message from Baldo Daub, MD sent at 09/15/2020  8:10 AM EDT ----- Good result no change in treatment

## 2020-09-15 NOTE — Telephone Encounter (Signed)
Left message on patients voicemail to please return our call.   

## 2020-09-29 ENCOUNTER — Ambulatory Visit (INDEPENDENT_AMBULATORY_CARE_PROVIDER_SITE_OTHER): Payer: Medicare Other | Admitting: Pulmonary Disease

## 2020-09-29 ENCOUNTER — Other Ambulatory Visit: Payer: Self-pay

## 2020-09-29 ENCOUNTER — Encounter: Payer: Self-pay | Admitting: Pulmonary Disease

## 2020-09-29 VITALS — BP 126/80 | HR 78 | Temp 97.1°F | Ht 63.0 in | Wt 185.0 lb

## 2020-09-29 DIAGNOSIS — R400 Somnolence: Secondary | ICD-10-CM

## 2020-09-29 DIAGNOSIS — R0683 Snoring: Secondary | ICD-10-CM | POA: Diagnosis not present

## 2020-09-29 NOTE — Progress Notes (Signed)
Molly Hogan    [id number]    May 21, 1950  Primary Care Physician:Mitchell, Rulon Sera, FNP  Referring Physician: Ninfa Meeker, FNP [address] [place],  [place] [address]  Chief complaint:   Patient being seen for daytime sleepiness, shortness of breath Recent evaluation by orthodontist who recommended that she get a sleep study  HPI:  Recently showing orthodontist and sleep study was recommended She does have a deviated septum, nasal stuffiness and congestion Tentatively scheduled for surgery June 15 to have some stenting and some other intervention performed  Admits to shortness of breath both during the day and nighttime Has nasal stuffiness and congestion Not known to have allergies  No underlying lung disease growing up Never smoker 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 acetaminophen (TYLENOL) 500 MG tablet Take 500 mg by mouth every 6 (six) hours as needed for pain.  Marland Kitchen aspirin EC 81 MG tablet Take 81 mg by mouth daily.  . baclofen (LIORESAL) 10 MG tablet Take 10 mg by mouth daily.  Tery Sanfilippo Calcium (STOOL SOFTENER PO) Take 2 tablets by mouth daily.  Marland Kitchen doxylamine, Sleep, (SLEEP AID) 25 MG tablet Take 25 mg by mouth at bedtime as needed for sleep.  Marland Kitchen estradiol (ESTRACE) 2 MG tablet Take 2 mg by mouth 3 (three) times a week. MON - WED - FRI  .  meloxicam (MOBIC) 15 MG tablet TAKE 1 TABLET BY MOUTH ONCE (1) DAILY WITH FOOD  . Multiple Vitamin (MULTIVITAMIN PO) Take 1 tablet by mouth daily.  Marland Kitchen omeprazole (PRILOSEC) 20 MG capsule Take 20 mg by mouth every morning.  . polyethylene glycol (MIRALAX / GLYCOLAX) 17 g packet Take 17 g by mouth daily.  . Psyllium (METAMUCIL FIBER PO) Take 2 capsules by mouth daily.  Marland Kitchen torsemide (DEMADEX) 20 MG tablet Take 1 tablet (20 mg total) by mouth daily.  . vitamin C (ASCORBIC ACID) 500 MG tablet Take 500 mg by mouth daily.  . Vitamin D, Ergocalciferol, (DRISDOL) 1.25 MG (50000 UT) CAPS capsule TAKE ONE CAPSULE BY MOUTH TWICE WEEKLY  . Wheat Dextrin (BENEFIBER PO) Take 1 tablet by mouth daily.   No facility-administered encounter medications on file as of 09/29/2020.    Allergies as of 09/29/2020  . (No Known Allergies)    Past Medical History:  Diagnosis Date  . Barrett esophagus   . Class 1 obesity   . Leg swelling 12/23/2018  . Multiple thyroid nodules 07/08/2011  . Osteopenia   . Pneumonia   . Post-cholecystectomy syndrome   . Recurrent nephrolithiasis   . Shortness of breath 12/23/2018  . Stasis edema with inflammation, bilateral   . Thyroid nodule   . Vitamin D deficiency     Past Surgical History:  Procedure Laterality Date  . ABDOMINAL HYSTERECTOMY  1992  . ANKLE FRACTURE SURGERY  2000  . CHOLECYSTECTOMY    .  GALLBLADDER SURGERY  2005  . gastric volulus  2012  . LITHOTRIPSY    . MENISCUS REPAIR  2013  . TONSILLECTOMY  1972  . TOTAL KNEE ARTHROPLASTY Left     Family History  Problem Relation Age of Onset  . Cancer Mother        breast, uterine  . Heart disease Mother   . AAA (abdominal aortic aneurysm) Mother     Social History   Socioeconomic History  . Marital status: Married    Spouse name: Not on file  . Number of children: Not on file  . Years of education: Not on file  . Highest education level: Not on file  Occupational History  . Not on file  Tobacco Use   . Smoking status: Never Smoker  . Smokeless tobacco: Never Used  Vaping Use  . Vaping Use: Never used  Substance and Sexual Activity  . Alcohol use: No  . Drug use: No  . Sexual activity: Not on file  Other Topics Concern  . Not on file  Social History Narrative  . Not on file   Social Determinants of Health   Financial Resource Strain: Not on file  Food Insecurity: Not on file  Transportation Needs: Not on file  Physical Activity: Not on file  Stress: Not on file  Social Connections: Not on file  Intimate Partner Violence: Not on file    Review of Systems  HENT:       Admits to nasal stuffiness and congestion  Respiratory: Positive for shortness of breath.   Psychiatric/Behavioral: Positive for sleep disturbance.    Vitals:   09/29/20 0852  BP: 126/80  Pulse: 78  Temp: (!) 97.1 F (36.2 C)  SpO2: 97%     Physical Exam Constitutional:      Appearance: She is obese.  HENT:     Right Ear: Tympanic membrane normal.     Nose: Nose normal.     Mouth/Throat:     Comments: Mallampati 3, crowded oropharynx Eyes:     General: No scleral icterus.       Right eye: No discharge.        Left eye: No discharge.     Pupils: Pupils are equal, round, and reactive to light.  Cardiovascular:     Rate and Rhythm: Normal rate and regular rhythm.     Heart sounds: No murmur heard. No friction rub.  Pulmonary:     Effort: No respiratory distress.     Breath sounds: No stridor. No wheezing or rhonchi.  Musculoskeletal:     Cervical back: No rigidity or tenderness.  Neurological:     Mental Status: She is alert.  Psychiatric:        Mood and Affect: Mood normal.   Epworth Sleepiness Scale today of 18  Assessment:  Excessive daytime sleepiness  Snoring  Nasal stuffiness and congestion/deviated septum -Scheduled for surgical intervention  Moderate probability of significant obstructive sleep apnea  Pathophysiology of sleep disordered breathing reviewed with the  patient Treatment options reviewed with the patient  Plan/Recommendations: Schedule the patient for home sleep study -He has taken about 3 to 4 weeks to get a study scheduled of recent  I do not believe presence of sleep apnea should preclude surgery May go into surgery without assumption she may have mild sleep disordered breathing that may need treated -If surgeon feels strongly about having her diagnosed and treated prior to surgical intervention -Unfortunately, intervention is taking months to initiate  Use of nasal steroids and antihistamine for nasal stuffiness and congestion may help  As patient does not have underlying lung disease, no predisposition to lung disease, -We will consider pulmonary function testing or other testing if symptoms do not improve with management of nasal stuffiness and congestion  I will see her back in 3 to 4 months  Encouraged to call with significant concerns   Virl Diamond MD [facility] 09/29/2020, 9:31 AM  CC: Vertell Novak*

## 2020-09-29 NOTE — Patient Instructions (Signed)
Snoring, shortness of breath, nasal stuffiness and congestion, daytime sleepiness  We will schedule you for home sleep study  For your nasal stuffiness and congestion -Nasal steroid-Nasonex, Flonase other options of treatment, usually 1 spray twice a day or 2 sprays once a day, try and use regularly and daily -Zyrtec, Allegra or Claritin-antihistamine-Daily use  Tentatively we will see you in 3 to 4 months  Call with significant concerns  Sleep study should not delay your surgery unless your surgeon feels strongly that you should have the study prior to intervention  Following your surgery, allow for 3 to 4 weeks before doing the sleep study

## 2020-10-03 DIAGNOSIS — J343 Hypertrophy of nasal turbinates: Secondary | ICD-10-CM | POA: Diagnosis not present

## 2020-10-03 DIAGNOSIS — J342 Deviated nasal septum: Secondary | ICD-10-CM | POA: Diagnosis not present

## 2020-10-03 DIAGNOSIS — J3489 Other specified disorders of nose and nasal sinuses: Secondary | ICD-10-CM | POA: Diagnosis not present

## 2020-10-03 DIAGNOSIS — R0981 Nasal congestion: Secondary | ICD-10-CM | POA: Diagnosis not present

## 2020-10-18 DIAGNOSIS — Z8672 Personal history of thrombophlebitis: Secondary | ICD-10-CM | POA: Diagnosis not present

## 2020-10-18 DIAGNOSIS — J343 Hypertrophy of nasal turbinates: Secondary | ICD-10-CM | POA: Diagnosis not present

## 2020-10-18 DIAGNOSIS — J3489 Other specified disorders of nose and nasal sinuses: Secondary | ICD-10-CM | POA: Diagnosis not present

## 2020-10-18 DIAGNOSIS — K2270 Barrett's esophagus without dysplasia: Secondary | ICD-10-CM | POA: Diagnosis not present

## 2020-10-18 DIAGNOSIS — J342 Deviated nasal septum: Secondary | ICD-10-CM | POA: Diagnosis not present

## 2020-10-18 DIAGNOSIS — R0981 Nasal congestion: Secondary | ICD-10-CM | POA: Diagnosis not present

## 2020-10-18 DIAGNOSIS — K219 Gastro-esophageal reflux disease without esophagitis: Secondary | ICD-10-CM | POA: Diagnosis not present

## 2020-10-26 DIAGNOSIS — Z09 Encounter for follow-up examination after completed treatment for conditions other than malignant neoplasm: Secondary | ICD-10-CM | POA: Diagnosis not present

## 2020-11-02 DIAGNOSIS — Z09 Encounter for follow-up examination after completed treatment for conditions other than malignant neoplasm: Secondary | ICD-10-CM | POA: Diagnosis not present

## 2020-11-03 HISTORY — PX: NASAL SINUS SURGERY: SHX719

## 2020-11-16 ENCOUNTER — Ambulatory Visit: Payer: Medicare Other

## 2020-11-16 ENCOUNTER — Other Ambulatory Visit: Payer: Self-pay

## 2020-11-16 DIAGNOSIS — R0683 Snoring: Secondary | ICD-10-CM

## 2020-11-16 DIAGNOSIS — G4733 Obstructive sleep apnea (adult) (pediatric): Secondary | ICD-10-CM | POA: Diagnosis not present

## 2020-11-16 DIAGNOSIS — R400 Somnolence: Secondary | ICD-10-CM

## 2020-11-22 DIAGNOSIS — K56609 Unspecified intestinal obstruction, unspecified as to partial versus complete obstruction: Secondary | ICD-10-CM | POA: Diagnosis not present

## 2020-11-22 DIAGNOSIS — J9811 Atelectasis: Secondary | ICD-10-CM | POA: Diagnosis not present

## 2020-11-22 DIAGNOSIS — Z79899 Other long term (current) drug therapy: Secondary | ICD-10-CM | POA: Diagnosis not present

## 2020-11-22 DIAGNOSIS — R531 Weakness: Secondary | ICD-10-CM | POA: Diagnosis not present

## 2020-11-22 DIAGNOSIS — K6389 Other specified diseases of intestine: Secondary | ICD-10-CM | POA: Diagnosis not present

## 2020-11-22 DIAGNOSIS — Z7982 Long term (current) use of aspirin: Secondary | ICD-10-CM | POA: Diagnosis not present

## 2020-11-22 DIAGNOSIS — K56699 Other intestinal obstruction unspecified as to partial versus complete obstruction: Secondary | ICD-10-CM | POA: Diagnosis not present

## 2020-11-22 DIAGNOSIS — K565 Intestinal adhesions [bands], unspecified as to partial versus complete obstruction: Secondary | ICD-10-CM | POA: Diagnosis not present

## 2020-11-22 DIAGNOSIS — K449 Diaphragmatic hernia without obstruction or gangrene: Secondary | ICD-10-CM | POA: Diagnosis not present

## 2020-11-22 DIAGNOSIS — R109 Unspecified abdominal pain: Secondary | ICD-10-CM | POA: Diagnosis not present

## 2020-11-22 DIAGNOSIS — M1990 Unspecified osteoarthritis, unspecified site: Secondary | ICD-10-CM | POA: Diagnosis not present

## 2020-11-22 DIAGNOSIS — N281 Cyst of kidney, acquired: Secondary | ICD-10-CM | POA: Diagnosis not present

## 2020-11-22 DIAGNOSIS — K56600 Partial intestinal obstruction, unspecified as to cause: Secondary | ICD-10-CM | POA: Diagnosis not present

## 2020-11-22 DIAGNOSIS — N200 Calculus of kidney: Secondary | ICD-10-CM | POA: Diagnosis not present

## 2020-11-22 DIAGNOSIS — R140 Abdominal distension (gaseous): Secondary | ICD-10-CM | POA: Diagnosis not present

## 2020-11-22 DIAGNOSIS — Z9049 Acquired absence of other specified parts of digestive tract: Secondary | ICD-10-CM | POA: Diagnosis not present

## 2020-11-22 DIAGNOSIS — M419 Scoliosis, unspecified: Secondary | ICD-10-CM | POA: Diagnosis not present

## 2020-11-27 ENCOUNTER — Telehealth: Payer: Self-pay | Admitting: Pulmonary Disease

## 2020-11-27 DIAGNOSIS — G4733 Obstructive sleep apnea (adult) (pediatric): Secondary | ICD-10-CM | POA: Diagnosis not present

## 2020-11-27 NOTE — Telephone Encounter (Signed)
Call patient  Sleep study result  Date of study: 11/16/2020  Impression: Mild obstructive sleep apnea Mild oxygen desaturations  Recommendation: Options of treatment for mild sleep disordered breathing will include  Will recommend CPAP therapy secondary to significant daytime sleepiness   1.  CPAP therapy if there is significant daytime sleepiness or other comorbidities including history of CVA or cardiac disease -If CPAP is chosen as an option of treatment auto titrating CPAP with a pressure setting of 5-15 will be appropriate   2.  Watchful waiting with emphasis on weight loss, sleep position modification to optimize lateral sleep, elevating the head of the bed by about 30 degrees may also help   3.  An oral device may be fashioned for the treatment of mild sleep disordered breathing, will involve referral to dentist.

## 2020-11-29 DIAGNOSIS — M791 Myalgia, unspecified site: Secondary | ICD-10-CM | POA: Diagnosis not present

## 2020-11-29 DIAGNOSIS — M47816 Spondylosis without myelopathy or radiculopathy, lumbar region: Secondary | ICD-10-CM | POA: Diagnosis not present

## 2020-11-29 DIAGNOSIS — M5106 Intervertebral disc disorders with myelopathy, lumbar region: Secondary | ICD-10-CM | POA: Diagnosis not present

## 2020-11-29 DIAGNOSIS — M4125 Other idiopathic scoliosis, thoracolumbar region: Secondary | ICD-10-CM | POA: Diagnosis not present

## 2020-11-29 DIAGNOSIS — G894 Chronic pain syndrome: Secondary | ICD-10-CM | POA: Diagnosis not present

## 2020-11-29 DIAGNOSIS — M545 Low back pain, unspecified: Secondary | ICD-10-CM | POA: Diagnosis not present

## 2020-12-04 NOTE — Telephone Encounter (Signed)
Called patient but she did not answer. Left message for her to call us back.  

## 2020-12-04 NOTE — Telephone Encounter (Signed)
Patient is returning phone call. Patient phone number is [phone] h or [phone] c.

## 2020-12-04 NOTE — Telephone Encounter (Signed)
ATC left VM

## 2020-12-05 DIAGNOSIS — Z8719 Personal history of other diseases of the digestive system: Secondary | ICD-10-CM | POA: Diagnosis not present

## 2020-12-05 DIAGNOSIS — R1084 Generalized abdominal pain: Secondary | ICD-10-CM | POA: Diagnosis not present

## 2020-12-05 DIAGNOSIS — Z6834 Body mass index (BMI) 34.0-34.9, adult: Secondary | ICD-10-CM | POA: Diagnosis not present

## 2020-12-05 DIAGNOSIS — E669 Obesity, unspecified: Secondary | ICD-10-CM | POA: Diagnosis not present

## 2020-12-07 NOTE — Telephone Encounter (Signed)
I called and spoke with patient regarding HST results and Dr. Beverley Fiedler. Patient verbalized understanding and will call us when she decides what she like to do. Nothing needed at this time, will keep in triage for f/u

## 2020-12-12 DIAGNOSIS — K56600 Partial intestinal obstruction, unspecified as to cause: Secondary | ICD-10-CM | POA: Diagnosis not present

## 2021-01-03 ENCOUNTER — Ambulatory Visit (INDEPENDENT_AMBULATORY_CARE_PROVIDER_SITE_OTHER): Payer: Medicare Other | Admitting: Pulmonary Disease

## 2021-01-03 ENCOUNTER — Other Ambulatory Visit: Payer: Self-pay

## 2021-01-03 ENCOUNTER — Encounter: Payer: Self-pay | Admitting: Pulmonary Disease

## 2021-01-03 VITALS — BP 120/76 | HR 74 | Temp 97.6°F | Ht 63.0 in | Wt 185.4 lb

## 2021-01-03 DIAGNOSIS — G4733 Obstructive sleep apnea (adult) (pediatric): Secondary | ICD-10-CM | POA: Diagnosis not present

## 2021-01-03 NOTE — Progress Notes (Signed)
Molly Hogan    [id number]    April 07, 1951  Primary Care Physician:Mitchell, Rulon Sera, FNP  Referring Physician: Ninfa Meeker, FNP [address] 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   baclofen (LIORESAL) 10 MG tablet Take 10 mg by mouth daily.   Docusate Calcium (STOOL SOFTENER PO) Take 2 tablets by mouth daily.   doxylamine, Sleep, (SLEEP AID) 25 MG tablet Take 25 mg by mouth at bedtime as needed for sleep.   estradiol (ESTRACE) 2 MG tablet Take 2 mg by mouth 3 (three) times a week. MON - WED - FRI   meloxicam (MOBIC) 15 MG tablet TAKE 1 TABLET BY MOUTH ONCE (1) DAILY WITH FOOD   Multiple Vitamin (MULTIVITAMIN PO) Take 1 tablet by mouth daily.   omeprazole (PRILOSEC) 20 MG capsule Take 20 mg by mouth every morning.   polyethylene glycol (MIRALAX / GLYCOLAX) 17 g packet Take  17 g by mouth daily.   Psyllium (METAMUCIL FIBER PO) Take 2 capsules by mouth daily.   vitamin C (ASCORBIC ACID) 500 MG tablet Take 500 mg by mouth daily.   Vitamin D, Ergocalciferol, (DRISDOL) 1.25 MG (50000 UT) CAPS capsule TAKE ONE CAPSULE BY MOUTH TWICE WEEKLY   Wheat Dextrin (BENEFIBER PO) Take 1 tablet by mouth daily.   acetaminophen (TYLENOL) 500 MG tablet Take 500 mg by mouth every 6 (six) hours as needed for pain.   torsemide (DEMADEX) 20 MG tablet Take 1 tablet (20 mg total) by mouth daily.   No facility-administered encounter medications on file as of 01/03/2021.    Allergies as of 01/03/2021   (No Known Allergies)    Past Medical History:  Diagnosis Date   Barrett esophagus    Class 1 obesity    Leg swelling 12/23/2018   Multiple thyroid nodules 07/08/2011   Osteopenia    Pneumonia    Post-cholecystectomy syndrome    Recurrent nephrolithiasis    Shortness of breath 12/23/2018   Stasis edema with inflammation, bilateral    Thyroid nodule    Vitamin D deficiency     Past Surgical History:  Procedure Laterality Date   ABDOMINAL HYSTERECTOMY  1992   ANKLE FRACTURE SURGERY  2000   CHOLECYSTECTOMY     GALLBLADDER SURGERY  2005   gastric volulus  2012   LITHOTRIPSY     MENISCUS REPAIR  2013   TONSILLECTOMY  1972   TOTAL KNEE ARTHROPLASTY Left     Family History  Problem Relation Age of Onset   Cancer Mother        breast, uterine   Heart disease Mother    AAA (abdominal aortic aneurysm) Mother     Social History   Socioeconomic History   Marital status: Married    Spouse name: Not on file   Number of children: Not on file   Years of education: Not on file   Highest education level: Not on file  Occupational History   Not on file  Tobacco Use   Smoking status: Never   Smokeless tobacco: Never  Vaping Use   Vaping Use: Never used  Substance and Sexual Activity   Alcohol use: No   Drug use: No   Sexual activity: Not on file  Other Topics Concern    Not on file  Social History Narrative   Not on file   Social Determinants of Health   Financial Resource Strain: Not on file  Food Insecurity: Not on file  Transportation Needs: Not on file  Physical Activity: Not on file  Stress: Not on file  Social Connections: Not on file  Intimate Partner Violence: Not on file    Review of Systems  HENT:         Admits to nasal stuffiness and congestion  Respiratory:  Positive for apnea and shortness of breath.   Psychiatric/Behavioral:  Positive for sleep disturbance.    Vitals:   01/03/21 0957  BP: 120/76  Pulse: 74  Temp: 97.6 F (36.4 C)  SpO2: 98%     Physical Exam Constitutional:      Appearance: She is obese.  HENT:     Mouth/Throat:     Comments: Mallampati 3, crowded oropharynx Eyes:     General: No scleral icterus.       Right eye: No discharge.        Left eye: No discharge.     Pupils: Pupils are equal, round, and reactive to light.  Cardiovascular:     Rate and Rhythm: Normal rate and regular rhythm.     Heart sounds: No murmur heard.   No friction rub.  Pulmonary:     Effort: No respiratory distress.     Breath sounds: No stridor. No wheezing or rhonchi.  Musculoskeletal:     Cervical back: No rigidity or tenderness.  Neurological:     Mental Status: She is alert.  Psychiatric:        Mood and Affect: Mood normal.  Epworth Sleepiness Scale today of 18  Assessment:  Mild obstructive sleep apnea  Mild oxygen desaturations  Excessive daytime sleepiness  Moderate probability of significant obstructive sleep apnea  Pathophysiology of sleep disordered breathing reviewed with the patient Treatment options reviewed with the patient  Plan/Recommendations:  Encouraged to continue weight loss efforts  Options of treatment for mild obstructive sleep apnea discussed with patient including use of CPAP, an oral device, aggressive weight loss measures  Symptomatic treatment of nasal stuffiness and  congestion  I will see her back in the office in about 6 months  Encouraged to call with any significant concerns  Virl Diamond MD [facility] 01/03/2021, 10:03 AM  CC: Vertell Novak*

## 2021-01-03 NOTE — Patient Instructions (Signed)
I will see you back in 6 months  Graded exercises as tolerated Encouraged weight loss efforts  Call with significant concerns  The mild obstructive sleep apnea and mildly low oxygen levels may contribute to headaches  Treatment options for mild obstructive sleep apnea includes CPAP therapy, oral device, aggressive weight loss efforts

## 2021-02-01 DIAGNOSIS — N200 Calculus of kidney: Secondary | ICD-10-CM | POA: Diagnosis not present

## 2021-02-04 DIAGNOSIS — S81802A Unspecified open wound, left lower leg, initial encounter: Secondary | ICD-10-CM | POA: Diagnosis not present

## 2021-02-04 DIAGNOSIS — M79605 Pain in left leg: Secondary | ICD-10-CM | POA: Diagnosis not present

## 2021-02-06 DIAGNOSIS — S81802D Unspecified open wound, left lower leg, subsequent encounter: Secondary | ICD-10-CM | POA: Diagnosis not present

## 2021-02-06 DIAGNOSIS — M79605 Pain in left leg: Secondary | ICD-10-CM | POA: Diagnosis not present

## 2021-02-08 DIAGNOSIS — Z6832 Body mass index (BMI) 32.0-32.9, adult: Secondary | ICD-10-CM | POA: Diagnosis not present

## 2021-02-08 DIAGNOSIS — I8311 Varicose veins of right lower extremity with inflammation: Secondary | ICD-10-CM | POA: Diagnosis not present

## 2021-02-08 DIAGNOSIS — S81802A Unspecified open wound, left lower leg, initial encounter: Secondary | ICD-10-CM | POA: Diagnosis not present

## 2021-02-08 DIAGNOSIS — I8312 Varicose veins of left lower extremity with inflammation: Secondary | ICD-10-CM | POA: Diagnosis not present

## 2021-02-15 DIAGNOSIS — I87332 Chronic venous hypertension (idiopathic) with ulcer and inflammation of left lower extremity: Secondary | ICD-10-CM | POA: Diagnosis not present

## 2021-02-15 DIAGNOSIS — Z23 Encounter for immunization: Secondary | ICD-10-CM | POA: Diagnosis not present

## 2021-02-15 DIAGNOSIS — Z6832 Body mass index (BMI) 32.0-32.9, adult: Secondary | ICD-10-CM | POA: Diagnosis not present

## 2021-02-15 DIAGNOSIS — L97929 Non-pressure chronic ulcer of unspecified part of left lower leg with unspecified severity: Secondary | ICD-10-CM | POA: Diagnosis not present

## 2021-02-22 DIAGNOSIS — I83029 Varicose veins of left lower extremity with ulcer of unspecified site: Secondary | ICD-10-CM | POA: Diagnosis not present

## 2021-02-22 DIAGNOSIS — Z6832 Body mass index (BMI) 32.0-32.9, adult: Secondary | ICD-10-CM | POA: Diagnosis not present

## 2021-02-22 DIAGNOSIS — L97929 Non-pressure chronic ulcer of unspecified part of left lower leg with unspecified severity: Secondary | ICD-10-CM | POA: Diagnosis not present

## 2021-03-01 DIAGNOSIS — Z6836 Body mass index (BMI) 36.0-36.9, adult: Secondary | ICD-10-CM | POA: Diagnosis not present

## 2021-03-01 DIAGNOSIS — M4125 Other idiopathic scoliosis, thoracolumbar region: Secondary | ICD-10-CM | POA: Diagnosis not present

## 2021-03-01 DIAGNOSIS — M545 Low back pain, unspecified: Secondary | ICD-10-CM | POA: Diagnosis not present

## 2021-03-01 DIAGNOSIS — G894 Chronic pain syndrome: Secondary | ICD-10-CM | POA: Diagnosis not present

## 2021-03-01 DIAGNOSIS — M791 Myalgia, unspecified site: Secondary | ICD-10-CM | POA: Diagnosis not present

## 2021-03-01 DIAGNOSIS — M5106 Intervertebral disc disorders with myelopathy, lumbar region: Secondary | ICD-10-CM | POA: Diagnosis not present

## 2021-03-08 DIAGNOSIS — L97921 Non-pressure chronic ulcer of unspecified part of left lower leg limited to breakdown of skin: Secondary | ICD-10-CM | POA: Diagnosis not present

## 2021-03-08 DIAGNOSIS — Z6831 Body mass index (BMI) 31.0-31.9, adult: Secondary | ICD-10-CM | POA: Diagnosis not present

## 2021-03-09 ENCOUNTER — Encounter: Payer: Self-pay | Admitting: Cardiology

## 2021-03-09 ENCOUNTER — Other Ambulatory Visit: Payer: Self-pay

## 2021-03-09 ENCOUNTER — Ambulatory Visit (INDEPENDENT_AMBULATORY_CARE_PROVIDER_SITE_OTHER): Payer: Medicare Other | Admitting: Cardiology

## 2021-03-09 VITALS — BP 110/80 | HR 70 | Ht 63.0 in | Wt 171.0 lb

## 2021-03-09 DIAGNOSIS — R0602 Shortness of breath: Secondary | ICD-10-CM

## 2021-03-09 DIAGNOSIS — I4719 Other supraventricular tachycardia: Secondary | ICD-10-CM

## 2021-03-09 DIAGNOSIS — I471 Supraventricular tachycardia: Secondary | ICD-10-CM

## 2021-03-09 DIAGNOSIS — G4733 Obstructive sleep apnea (adult) (pediatric): Secondary | ICD-10-CM | POA: Diagnosis not present

## 2021-03-09 DIAGNOSIS — S81802A Unspecified open wound, left lower leg, initial encounter: Secondary | ICD-10-CM | POA: Diagnosis not present

## 2021-03-09 HISTORY — DX: Supraventricular tachycardia: I47.1

## 2021-03-09 HISTORY — DX: Other supraventricular tachycardia: I47.19

## 2021-03-09 NOTE — Patient Instructions (Signed)
Medication Instructions:  Your physician recommends that you continue on your current medications as directed. Please refer to the Current Medication list given to you today.  *If you need a refill on your cardiac medications before your next appointment, please call your pharmacy*   Lab Work: Your physician recommends that you return for lab work in: TODAY BMP, ProBNP If you have labs (blood work) drawn today and your tests are completely normal, you will receive your results only by: MyChart Message (if you have MyChart) OR A paper copy in the mail If you have any lab test that is abnormal or we need to change your treatment, we will call you to review the results.   Testing/Procedures: Your physician has requested that you have an echocardiogram. Echocardiography is a painless test that uses sound waves to create images of your heart. It provides your doctor with information about the size and shape of your heart and how well your heart's chambers and valves are working. This procedure takes approximately one hour. There are no restrictions for this procedure.  Your physician has recommended that you have a pulmonary function test. Pulmonary Function Tests are a group of tests that measure how well air moves in and out of your lungs.    Follow-Up: At [facility], you and your health needs are our priority.  As part of our continuing mission to provide you with exceptional heart care, we have created designated Provider Care Teams.  These Care Teams include your primary Cardiologist (physician) and Advanced Practice Providers (APPs -  Physician Assistants and Nurse Practitioners) who all work together to provide you with the care you need, when you need it.  We recommend signing up for the patient portal called "MyChart".  Sign up information is provided on this After Visit Summary.  MyChart is used to connect with patients for Virtual Visits (Telemedicine).  Patients are able to view  lab/test results, encounter notes, upcoming appointments, etc.  Non-urgent messages can be sent to your provider as well.   To learn more about what you can do with MyChart, go to [email or web address].    Your next appointment:   6 month(s)  The format for your next appointment:   In Person  Provider:   Norman Herrlich, MD    Other Instructions

## 2021-03-09 NOTE — Progress Notes (Signed)
Cardiology Office Note:    Date:  03/09/2021   ID:  Molly Hogan, DOB 19-Feb-1951, MRN [id number]  PCP:  Ninfa Meeker, FNP  Cardiologist:  Norman Herrlich, MD    Referring MD: Vertell Novak*    ASSESSMENT:    1. Shortness of breath   2. Obstructive sleep apnea syndrome   3. Paroxysmal atrial tachycardia (HCC)    PLAN:    In order of problems listed above:  He continues to be bothered by shortness of breath with no obvious signs of heart failure.  She takes a loop diuretic I will recheck renal function and recheck a proBNP level its been over 2 years we will recheck an echocardiogram for signs of systolic or diastolic heart failure or pulmonary hypertension.  At her request I will go ahead and order the pulmonary function test recommended by pulmonary if her symptoms do not improve. Unfortunately unimproved after sinus surgery not on CPAP   Next appointment: 6 months   Medication Adjustments/Labs and Tests Ordered: Current medicines are reviewed at length with the patient today.  Concerns regarding medicines are outlined above.  No orders of the defined types were placed in this encounter.  No orders of the defined types were placed in this encounter.   Chief Complaint  Patient presents with   Follow-up   Shortness of Breath    History of Present Illness:    Molly Hogan is a 70 y.o. female with a hx of DVT with chronic lower extremity edema atrial arrhythmia with paroxysmal atrial tachycardia and shortness of breath last seen 08/05/2020.  Echocardiogram did not shown findings of systolic or diastolic dysfunction and proBNP level was not elevated.  She was admitted to [facility] July 2022 with abdominal pain and found to have small bowel obstruction managed conservatively.  Hemoglobin 11.3 platelets 241,000 potassium 3.7 creatinine 0.5 proBNP level low was 322 she had multiple troponins assessed they were normal ,her EKG independently  reviewed by me 11/22/2020 shows sinus rhythm normal EKG  Has been seen by pulmonary and is found to have mild obstructive sleep apnea with mild oxygen desaturation.  Compliance with diet, lifestyle and medications: Yes  She has also had sinus surgery without improvement Her shortness of breath is with and without activities and she has been on the diuretic for about a year without improvement. No edema orthopnea chest pain palpitation or syncope Past Medical History:  Diagnosis Date   Barrett esophagus    Class 1 obesity    Leg swelling 12/23/2018   Multiple thyroid nodules 07/08/2011   Osteopenia    Pneumonia    Post-cholecystectomy syndrome    Recurrent nephrolithiasis    Shortness of breath 12/23/2018   Stasis edema with inflammation, bilateral    Thyroid nodule    Vitamin D deficiency     Past Surgical History:  Procedure Laterality Date   ABDOMINAL HYSTERECTOMY  1992   ANKLE FRACTURE SURGERY  2000   CHOLECYSTECTOMY     GALLBLADDER SURGERY  2005   gastric volulus  2012   LITHOTRIPSY     MENISCUS REPAIR  2013   NASAL SINUS SURGERY  11/2020   TONSILLECTOMY  1972   TOTAL KNEE ARTHROPLASTY Left     Current Medications: Current Meds  Medication Sig   aspirin EC 81 MG tablet Take 81 mg by mouth daily.   baclofen (LIORESAL) 10 MG tablet Take 10 mg by mouth 3 (three) times daily.   doxylamine, Sleep, (SLEEP  AID) 25 MG tablet Take 25 mg by mouth at bedtime as needed for sleep.   estradiol (ESTRACE) 2 MG tablet Take 2 mg by mouth 3 (three) times a week. MON - WED - FRI   meloxicam (MOBIC) 15 MG tablet TAKE 1 TABLET BY MOUTH ONCE (1) DAILY WITH FOOD   Multiple Vitamin (MULTIVITAMIN PO) Take 1 tablet by mouth daily.   omeprazole (PRILOSEC) 20 MG capsule Take 20 mg by mouth every morning.   polyethylene glycol (MIRALAX / GLYCOLAX) 17 g packet Take 17 g by mouth daily.   tamsulosin (FLOMAX) 0.4 MG CAPS capsule Take 0.4 mg by mouth daily.   torsemide (DEMADEX) 20 MG tablet Take 1  tablet (20 mg total) by mouth daily.   vitamin C (ASCORBIC ACID) 500 MG tablet Take 500 mg by mouth daily.   Vitamin D, Ergocalciferol, (DRISDOL) 1.25 MG (50000 UT) CAPS capsule TAKE ONE CAPSULE BY MOUTH TWICE WEEKLY     Allergies:   Patient has no known allergies.   Social History   Socioeconomic History   Marital status: Married    Spouse name: Not on file   Number of children: Not on file   Years of education: Not on file   Highest education level: Not on file  Occupational History   Not on file  Tobacco Use   Smoking status: Never   Smokeless tobacco: Never  Vaping Use   Vaping Use: Never used  Substance and Sexual Activity   Alcohol use: No   Drug use: No   Sexual activity: Not on file  Other Topics Concern   Not on file  Social History Narrative   Not on file   Social Determinants of Health   Financial Resource Strain: Not on file  Food Insecurity: Not on file  Transportation Needs: Not on file  Physical Activity: Not on file  Stress: Not on file  Social Connections: Not on file     Family History: The patient's family history includes AAA (abdominal aortic aneurysm) in her mother; Cancer in her mother; Heart disease in her mother. ROS:   Please see the history of present illness.    All other systems reviewed and are negative.  EKGs/Labs/Other Studies Reviewed:    The following studies were reviewed today:   Physical Exam:    VS:  BP 110/80 (BP Location: Right Arm, Patient Position: Sitting, Cuff Size: Normal)   Pulse 70   Ht 5\' 3"  (1.6 m)   Wt 171 lb (77.6 kg)   SpO2 95%   BMI 30.29 kg/m     Wt Readings from Last 3 Encounters:  03/09/21 171 lb (77.6 kg)  01/03/21 185 lb 6.4 oz (84.1 kg)  09/29/20 185 lb (83.9 kg)     GEN:  Well nourished, well developed in no acute distress HEENT: Normal NECK: No JVD; No carotid bruits LYMPHATICS: No lymphadenopathy CARDIAC: RRR, no murmurs, rubs, gallops RESPIRATORY:  Clear to auscultation without  rales, wheezing or rhonchi  ABDOMEN: Soft, non-tender, non-distended MUSCULOSKELETAL:  No edema; No deformity  SKIN: Warm and dry NEUROLOGIC:  Alert and oriented x 3 PSYCHIATRIC:  Normal affect    Signed, 10/01/20, MD  03/09/2021 12:51 PM    [facility]

## 2021-03-10 LAB — BASIC METABOLIC PANEL
BUN/Creatinine Ratio: 29 — ABNORMAL HIGH (ref 12–28)
BUN: 21 mg/dL (ref 8–27)
CO2: 26 mmol/L (ref 20–29)
Calcium: 9.5 mg/dL (ref 8.7–10.3)
Chloride: 103 mmol/L (ref 96–106)
Creatinine, Ser: 0.72 mg/dL (ref 0.57–1.00)
Glucose: 96 mg/dL (ref 70–99)
Potassium: 4 mmol/L (ref 3.5–5.2)
Sodium: 142 mmol/L (ref 134–144)
eGFR: 90 mL/min/{1.73_m2} (ref >59)

## 2021-03-10 LAB — PRO B NATRIURETIC PEPTIDE: NT-Pro BNP: 91 pg/mL (ref 0–301)

## 2021-03-12 ENCOUNTER — Telehealth: Payer: Self-pay

## 2021-03-12 NOTE — Telephone Encounter (Signed)
-----   Message from Baldo Daub, MD sent at 03/10/2021  2:36 PM EDT ----- Normal or stable result

## 2021-03-12 NOTE — Telephone Encounter (Signed)
Spoke with patient regarding results and recommendation.  Patient verbalizes understanding and is agreeable to plan of care. Advised patient to call back with any issues or concerns.  

## 2021-03-20 ENCOUNTER — Other Ambulatory Visit: Payer: Self-pay

## 2021-03-20 ENCOUNTER — Ambulatory Visit (INDEPENDENT_AMBULATORY_CARE_PROVIDER_SITE_OTHER): Payer: Medicare Other

## 2021-03-20 DIAGNOSIS — R0602 Shortness of breath: Secondary | ICD-10-CM

## 2021-03-20 LAB — ECHOCARDIOGRAM COMPLETE
Area-P 1/2: 3.17 cm2
S' Lateral: 2.8 cm

## 2021-03-21 ENCOUNTER — Telehealth: Payer: Self-pay | Admitting: Cardiology

## 2021-03-21 DIAGNOSIS — S81802A Unspecified open wound, left lower leg, initial encounter: Secondary | ICD-10-CM | POA: Diagnosis not present

## 2021-03-21 NOTE — Telephone Encounter (Signed)
Patient returning call for echo results. 

## 2021-03-21 NOTE — Telephone Encounter (Signed)
Patient informed of results earlier.

## 2021-03-22 DIAGNOSIS — R0602 Shortness of breath: Secondary | ICD-10-CM | POA: Diagnosis not present

## 2021-03-27 ENCOUNTER — Telehealth: Payer: Self-pay

## 2021-03-27 NOTE — Telephone Encounter (Signed)
-----   Message from Baldo Daub, MD sent at 03/27/2021  4:52 PM EST ----- PFTs at [facility] are normal

## 2021-03-27 NOTE — Telephone Encounter (Signed)
Spoke with patient regarding results and recommendation.  Patient verbalizes understanding and is agreeable to plan of care. Advised patient to call back with any issues or concerns.  

## 2021-04-04 DIAGNOSIS — S81802A Unspecified open wound, left lower leg, initial encounter: Secondary | ICD-10-CM | POA: Diagnosis not present

## 2021-04-18 DIAGNOSIS — S81802A Unspecified open wound, left lower leg, initial encounter: Secondary | ICD-10-CM | POA: Diagnosis not present

## 2021-05-03 DIAGNOSIS — S81802A Unspecified open wound, left lower leg, initial encounter: Secondary | ICD-10-CM | POA: Diagnosis not present

## 2021-05-16 DIAGNOSIS — S81802A Unspecified open wound, left lower leg, initial encounter: Secondary | ICD-10-CM | POA: Diagnosis not present

## 2021-05-23 DIAGNOSIS — S81802A Unspecified open wound, left lower leg, initial encounter: Secondary | ICD-10-CM | POA: Diagnosis not present

## 2021-05-28 ENCOUNTER — Other Ambulatory Visit: Payer: Self-pay | Admitting: Cardiology

## 2021-06-01 DIAGNOSIS — G894 Chronic pain syndrome: Secondary | ICD-10-CM | POA: Diagnosis not present

## 2021-06-01 DIAGNOSIS — M545 Low back pain, unspecified: Secondary | ICD-10-CM | POA: Diagnosis not present

## 2021-06-01 DIAGNOSIS — M791 Myalgia, unspecified site: Secondary | ICD-10-CM | POA: Diagnosis not present

## 2021-06-05 DIAGNOSIS — I10 Essential (primary) hypertension: Secondary | ICD-10-CM | POA: Diagnosis not present

## 2021-06-05 DIAGNOSIS — E782 Mixed hyperlipidemia: Secondary | ICD-10-CM | POA: Diagnosis not present

## 2021-06-06 DIAGNOSIS — S81802A Unspecified open wound, left lower leg, initial encounter: Secondary | ICD-10-CM | POA: Diagnosis not present

## 2021-06-13 DIAGNOSIS — Z20828 Contact with and (suspected) exposure to other viral communicable diseases: Secondary | ICD-10-CM | POA: Diagnosis not present

## 2021-06-13 DIAGNOSIS — M791 Myalgia, unspecified site: Secondary | ICD-10-CM | POA: Diagnosis not present

## 2021-06-13 DIAGNOSIS — J209 Acute bronchitis, unspecified: Secondary | ICD-10-CM | POA: Diagnosis not present

## 2021-06-13 DIAGNOSIS — R0981 Nasal congestion: Secondary | ICD-10-CM | POA: Diagnosis not present

## 2021-06-13 DIAGNOSIS — J0100 Acute maxillary sinusitis, unspecified: Secondary | ICD-10-CM | POA: Diagnosis not present

## 2021-06-18 ENCOUNTER — Encounter (HOSPITAL_BASED_OUTPATIENT_CLINIC_OR_DEPARTMENT_OTHER): Payer: Medicare Other | Attending: Internal Medicine | Admitting: Internal Medicine

## 2021-06-18 ENCOUNTER — Other Ambulatory Visit: Payer: Self-pay

## 2021-06-18 DIAGNOSIS — I87312 Chronic venous hypertension (idiopathic) with ulcer of left lower extremity: Secondary | ICD-10-CM | POA: Diagnosis not present

## 2021-06-18 DIAGNOSIS — L97822 Non-pressure chronic ulcer of other part of left lower leg with fat layer exposed: Secondary | ICD-10-CM | POA: Insufficient documentation

## 2021-06-18 DIAGNOSIS — I872 Venous insufficiency (chronic) (peripheral): Secondary | ICD-10-CM | POA: Insufficient documentation

## 2021-06-18 NOTE — Progress Notes (Signed)
Molly Hogan ([id number]) , Visit Report for 06/18/2021 Abuse Risk Screen Details Patient Name: Date of Service: Molly Hogan Molly C. [id number] 9:00 A M Medical Record Number: [id number] Patient Account Number: [id number] Date of Birth/Sex: Treating RN: 29-Jun-1950 (71 y.o. Molly Hogan Primary Care Molly Hogan: Clydie Braun Other Clinician: Referring Fenris Cauble: Treating Emmani Lesueur/Extender: Beckey Downing in Treatment: 0 Abuse Risk Screen Items Answer ABUSE RISK SCREEN: Has anyone close to you tried to hurt or harm you recentlyo No Do you feel uncomfortable with anyone in your familyo No Has anyone forced you do things that you didnt want to doo No Electronic Signature(s) Signed: 06/18/2021 2:06:14 PM By: Dellie Catholic RN Entered By: Dellie Catholic on 06/18/2021 09:26:44 -------------------------------------------------------------------------------- Activities of Daily Living Details Patient Name: Date of Service: Molly Hogan Molly C. [id number] 9:00 [place] Record Number: [id number] Patient Account Number: [id number] Date of Birth/Sex: Treating RN: [birth year] (71 y.o. Molly Hogan Primary Care Karmella Bouvier: Clydie Braun Other Clinician: Referring Marquasha Brutus: Treating Nael Petrosyan/Extender: Beckey Downing in Treatment: 0 Activities of Daily Living Items Answer Activities of Daily Living (Please select one for each item) Drive Automobile Completely Able T Medications ake Completely Able Use T elephone Completely Able Care for Appearance Completely Able Use T oilet Completely Able Bath / Shower Completely Able Dress Self Completely Able Feed Self Completely Able Walk Completely Able Get In / Out Bed Completely Able Housework Completely Able Prepare Meals Completely [place] for Self Completely Able Electronic Signature(s) Signed: 06/18/2021 2:06:14 PM By:  Dellie Catholic RN Entered By: Dellie Catholic on 06/18/2021 09:27:16 -------------------------------------------------------------------------------- Education Screening Details Patient Name: Date of Service: Molly Hogan Molly C. [id number] 9:00 A M Medical Record Number: [id number] Patient Account Number: [id number] Date of Birth/Sex: Treating RN: 12/28/1950 (71 y.o. Molly Hogan Primary Care Debrah Granderson: Clydie Braun Other Clinician: Referring Sihaam Chrobak: Treating Shatia Sindoni/Extender: Beckey Downing in Treatment: 0 Primary Learner Assessed: Patient Learning Preferences/Education Level/Primary Language Learning Preference: Explanation, Demonstration, Printed Material Highest Education Level: College or Above Preferred Language: English Cognitive Barrier Language Barrier: No Translator Needed: No Memory Deficit: No Emotional Barrier: No Cultural/Religious Beliefs Affecting Medical Care: No Physical Barrier Impaired Vision: No Impaired Hearing: No Decreased Hand dexterity: No Knowledge/Comprehension Knowledge Level: High Comprehension Level: High Ability to understand written instructions: High Ability to understand verbal instructions: High Motivation Anxiety Level: Calm Cooperation: Cooperative Education Importance: Acknowledges Need Interest in Health Problems: Asks Questions Perception: Coherent Willingness to Engage in Self-Management High Activities: Readiness to Engage in Self-Management High Activities: Electronic Signature(s) Signed: 06/18/2021 2:06:14 PM By: Dellie Catholic RN Entered By: Dellie Catholic on 06/18/2021 [id number] -------------------------------------------------------------------------------- Fall Risk Assessment Details Patient Name: Date of Service: Molly Hogan Molly C. [id number] 9:00 [place] Record Number: [id number] Patient Account Number: [id number] Date of Birth/Sex: Treating RN: [birth year] (71  y.o. Molly Hogan Primary Care Sharlet Notaro: Clydie Braun Other Clinician: Referring Genna Casimir: Treating Jini Horiuchi/Extender: Beckey Downing in Treatment: 0 Fall Risk Assessment Items Have you had 2 or more falls in the last 12 monthso 0 No Have you had any fall that resulted in injury in the last 12 monthso 0 No FALLS RISK SCREEN History of falling - immediate or within 3 months 0 No Secondary diagnosis (Do you have 2 or more medical diagnoseso) 0 No Ambulatory aid None/bed rest/wheelchair/nurse 0 No Crutches/cane/walker 0 No Furniture 0 No Intravenous therapy Access/Saline/Heparin Lock 0 No Gait/Transferring Normal/ bed rest/  wheelchair 0 No Weak (short steps with or without shuffle, stooped but able to lift head while walking, may seek 0 No support from furniture) Impaired (short steps with shuffle, may have difficulty arising from chair, head down, impaired 0 No balance) Mental Status Oriented to own ability 0 No Electronic Signature(s) Signed: 06/18/2021 2:06:14 PM By: Dellie Catholic RN Entered By: Dellie Catholic on 06/18/2021 09:29:02 -------------------------------------------------------------------------------- Foot Assessment Details Patient Name: Date of Service: Molly Hogan Molly C. [id number] 9:00 [place] Record Number: [id number] Patient Account Number: [id number] Date of Birth/Sex: Treating RN: [birth year] (71 y.o. Molly Hogan Primary Care Lillee Mooneyhan: Clydie Braun Other Clinician: Referring Demika Langenderfer: Treating Shaylon Aden/Extender: Beckey Downing in Treatment: 0 Foot Assessment Items Site Locations + = Sensation present, - = Sensation absent, C = Callus, U = Ulcer R = Redness, W = Warmth, M = Maceration, PU = Pre-ulcerative lesion F = Fissure, S = Swelling, D = Dryness Assessment Right: Left: Other Deformity: No No Prior Foot Ulcer: No No Prior Amputation: No No Charcot Joint: No  No Ambulatory Status: Ambulatory Without Help Gait: Steady Electronic Signature(s) Signed: 06/18/2021 2:06:14 PM By: Dellie Catholic RN Entered By: Dellie Catholic on 06/18/2021 09:31:18 -------------------------------------------------------------------------------- Nutrition Risk Screening Details Patient Name: Date of Service: Molly Hogan Molly C. [id number] 9:00 A M Medical Record Number: [id number] Patient Account Number: [id number] Date of Birth/Sex: Treating RN: 16-Jun-1950 (71 y.o. Molly Hogan Primary Care Deveon Kisiel: Clydie Braun Other Clinician: Referring Kriston Pasquarello: Treating Aja Whitehair/Extender: Beckey Downing in Treatment: 0 Height (in): 62 Weight (lbs): 162 Body Mass Index (BMI): 29.6 Nutrition Risk Screening Items Score Screening NUTRITION RISK SCREEN: I have an illness or condition that made me change the kind and/or amount of food I eat 0 No I eat fewer than two meals per day 0 No I eat few fruits and vegetables, or milk products 0 No I have three or more drinks of beer, liquor or wine almost every day 0 No I have tooth or mouth problems that make it hard for me to eat 0 No I don't always have enough money to buy the food I need 0 No I eat alone most of the time 0 No I take three or more different prescribed or over-the-counter drugs a day 0 No Without wanting to, I have lost or gained 10 pounds in the last six months 0 No I am not always physically able to shop, cook and/or feed myself 0 No Nutrition Protocols Good Risk Protocol 0 No interventions needed Moderate Risk Protocol High Risk Proctocol Risk Level: Good Risk Score: 0 Electronic Signature(s) Signed: 06/18/2021 2:06:14 PM By: Dellie Catholic RN Entered By: Dellie Catholic on 06/18/2021 09:29:42

## 2021-06-18 NOTE — Progress Notes (Signed)
Gagner Jasmeet C. ([id number]) , Visit Report for 06/18/2021 Chief Complaint Document Details Patient Name: Date of Service: CA Molly Hogan 06/18/2021 9:00 A M Medical Record Number: [id number] Patient Account Number: [id number] Date of Birth/Sex: Treating RN: 1951-05-06 (71 y.o. Roel Cluck Primary Care Provider: Maurie Boettcher Other Clinician: Referring Provider: Treating Provider/Extender: Franchot Heidelberg in Treatment: 0 Information Obtained from: Patient Chief Complaint Left lower extremity wound Electronic Signature(s) Signed: 06/18/2021 10:23:08 AM By: Geralyn Corwin DO Entered By: Geralyn Corwin on 06/18/2021 10:09:41 -------------------------------------------------------------------------------- Debridement Details Patient Name: Date of Service: CA Molly Hogan Molly C. 06/18/2021 9:00 A M Medical Record Number: [id number] Patient Account Number: [id number] Date of Birth/Sex: Treating RN: 10/15/1950 (71 y.o. Katrinka Blazing Primary Care Provider: Maurie Boettcher Other Clinician: Referring Provider: Treating Provider/Extender: Franchot Heidelberg in Treatment: 0 Debridement Performed for Assessment: Wound #1 Left,Anterior Lower Leg Performed By: Physician Geralyn Corwin, DO Debridement Type: Debridement Severity of Tissue Pre Debridement: Fat layer exposed Level of Consciousness (Pre-procedure): Awake and Alert Pre-procedure Verification/Time Out Yes - 10:00 Taken: Start Time: 10:00 Pain Control: Other : T Area Debrided (L x W): otal 2.3 (cm) x 1.5 (cm) = 3.45 (cm) Tissue and other material debrided: Non-Viable, Slough, Subcutaneous, Slough Level: Skin/Subcutaneous Tissue Debridement Description: Excisional Instrument: Curette Bleeding: Minimum Hemostasis Achieved: Pressure Procedural Pain: 1 Post Procedural Pain: 2 Response to Treatment: Procedure was tolerated well Level of  Consciousness (Post- Awake and Alert procedure): Post Debridement Measurements of Total Wound Length: (cm) 2.3 Width: (cm) 1.5 Depth: (cm) 0.1 Volume: (cm) 0.271 Character of Wound/Ulcer Post Debridement: Improved Severity of Tissue Post Debridement: Fat layer exposed Post Procedure Diagnosis Same as Pre-procedure Electronic Signature(s) Signed: 06/18/2021 10:23:08 AM By: Geralyn Corwin DO Signed: 06/18/2021 2:06:14 PM By: Karie Schwalbe RN Entered By: Karie Schwalbe on 06/18/2021 10:06:32 -------------------------------------------------------------------------------- HPI Details Patient Name: Date of Service: CA Molly Hogan Molly C. 06/18/2021 9:00 A M Medical Record Number: [id number] Patient Account Number: [id number] Date of Birth/Sex: Treating RN: 03/14/1951 (71 y.o. Roel Cluck Primary Care Provider: Maurie Boettcher Other Clinician: Referring Provider: Treating Provider/Extender: Franchot Heidelberg in Treatment: 0 History of Present Illness HPI Description: Admission 06/18/2021 Ms. Molly Hogan is a 71 year old female with a past medical history of chronic venous insufficiency that presents to the clinic for a 67-month history of nonhealing wound to her left lower extremity. She states this happened when she had a dishwasher door. She has been following with Dr. Lequita Halt at [facility] for this issue. She started with calcium alginate and Adaptic and now is currently using Medihoney. She reports tenderness to the wound site. She denies systemic signs of infection. She has compression stockings but does not wear these daily. Electronic Signature(s) Signed: 06/18/2021 10:23:08 AM By: Geralyn Corwin DO Entered By: Geralyn Corwin on 06/18/2021 10:14:13 -------------------------------------------------------------------------------- Physical Exam Details Patient Name: Date of Service: CA Molly Hogan Molly C. 06/18/2021 9:00 A M Medical  Record Number: [id number] Patient Account Number: [id number] Date of Birth/Sex: Treating RN: 30-Aug-1950 (70 y.o. Roel Cluck Primary Care Provider: Maurie Boettcher Other Clinician: Referring Provider: Treating Provider/Extender: Cordie Grice Weeks in Treatment: 0 Constitutional respirations regular, non-labored and within target range for patient.. Cardiovascular 2+ dorsalis pedis/posterior tibialis pulses. Psychiatric pleasant and cooperative. Notes Left lower extremity: T the anterior aspect there is an open wound with nonviable tissue throughout. Molly Hogan exam. No signs of surrounding o infection. Venous stasis dermatitis and varicose veins noted.  Electronic Signature(s) Signed: 06/18/2021 10:23:08 AM By: Geralyn Corwin DO Entered By: Geralyn Corwin on 06/18/2021 10:19:50 -------------------------------------------------------------------------------- Physician Orders Details Patient Name: Date of Service: CA Molly Hogan Molly C. 06/18/2021 9:00 A M Medical Record Number: [id number] Patient Account Number: [id number] Date of Birth/Sex: Treating RN: 1950/12/26 (71 y.o. Roel Cluck Primary Care Provider: Maurie Boettcher Other Clinician: Referring Provider: Treating Provider/Extender: Franchot Heidelberg in Treatment: 0 Verbal / Phone Orders: No Diagnosis Coding ICD-10 Coding Code Description [phone] Chronic venous hypertension (idiopathic) with ulcer of left lower extremity L97.822 Non-pressure chronic ulcer of other part of left lower leg with fat layer exposed Follow-up Appointments ppointment in 1 week. - Dr Mikey Bussing Return A Bathing/ Shower/ Hygiene May shower with protection but do not get wound dressing(s) wet. - *** If showering, please use leg protector*** These can be purchased from local pharmacies such as [facility] or [facility] Edema Control - Lymphedema / SCD / Other Bilateral Lower  Extremities Elevate legs to the level of the heart or above for 30 minutes daily and/or when sitting, a frequency of: Avoid standing for long periods of time. Patient to wear own compression stockings every day. - Right leg Exercise regularly Wound Treatment Wound #1 - Lower Leg Wound Laterality: Left, Anterior Cleanser: Soap and Water Discharge Instructions: May shower and wash wound with dial antibacterial soap and water prior to dressing change. Cleanser: Wound Cleanser Discharge Instructions: Cleanse the wound with wound cleanser prior to applying a clean dressing using gauze sponges, not tissue or cotton balls. Topical: Gentamicin Discharge Instructions: As directed by physician Prim Dressing: Hydrofera Blue Ready Foam, 2.5 x2.5 in ary Discharge Instructions: Apply to wound bed as instructed Secondary Dressing: Woven Gauze Sponge, Non-Sterile 4x4 in Discharge Instructions: Apply over primary dressing as directed. Secured With: Coban Self-Adherent Wrap 4x5 (in/yd) Discharge Instructions: Secure with Coban as directed. Secured With: [employer], 4.5x3.1 (in/yd) Discharge Instructions: Secure with Kerlix as directed. Secured With: Transpore Surgical Tape, 2x10 (in/yd) Discharge Instructions: Secure dressing with tape as directed. Electronic Signature(s) Signed: 06/18/2021 12:33:39 PM By: Geralyn Corwin DO Signed: 06/18/2021 2:06:14 PM By: Karie Schwalbe RN Previous Signature: 06/18/2021 10:23:08 AM Version By: Geralyn Corwin DO Entered By: Karie Schwalbe on 06/18/2021 10:32:35 -------------------------------------------------------------------------------- Problem List Details Patient Name: Date of Service: CA Molly Hogan Molly C. 06/18/2021 9:00 A M Medical Record Number: [id number] Patient Account Number: [id number] Date of Birth/Sex: Treating RN: March 23, 1951 (71 y.o. Roel Cluck Primary Care Provider: Maurie Boettcher Other Clinician: Referring  Provider: Treating Provider/Extender: Franchot Heidelberg in Treatment: 0 Active Problems ICD-10 Encounter Code Description Active Date MDM Diagnosis I87.312 Chronic venous hypertension (idiopathic) with ulcer of left lower extremity 06/18/2021 No Yes L97.822 Non-pressure chronic ulcer of other part of left lower leg with fat layer exposed2/13/2023 No Yes Inactive Problems Resolved Problems Electronic Signature(s) Signed: 06/18/2021 10:23:08 AM By: Geralyn Corwin DO Entered By: Geralyn Corwin on 06/18/2021 10:09:11 -------------------------------------------------------------------------------- Progress Note Details Patient Name: Date of Service: CA Molly Hogan Molly C. 06/18/2021 9:00 A M Medical Record Number: [id number] Patient Account Number: [id number] Date of Birth/Sex: Treating RN: May 08, 1950 (71 y.o. Roel Cluck Primary Care Provider: Maurie Boettcher Other Clinician: Referring Provider: Treating Provider/Extender: Franchot Heidelberg in Treatment: 0 Subjective Chief Complaint Information obtained from Patient Left lower extremity wound History of Present Illness (HPI) Admission 06/18/2021 Ms. Molly Hogan is a 71 year old female with a past medical history of chronic venous insufficiency that presents to the clinic for a  3-month history of nonhealing wound to her left lower extremity. She states this happened when she had a dishwasher door. She has been following with Dr. Lequita Halt at [facility] for this issue. She started with calcium alginate and Adaptic and now is currently using Medihoney. She reports tenderness to the wound site. She denies systemic signs of infection. She has compression stockings but does not wear these daily. Patient History Information obtained from Patient. Allergies No Known Allergies Family History Unknown History. Social History Never smoker, Marital Status - Married, Alcohol  Use - Never, Drug Use - No History, Caffeine Use - Moderate - T or coffee. ea Medical History Cardiovascular Patient has history of Arrhythmia - Hx: SVT Musculoskeletal Patient has history of Osteoarthritis - Right Knee Hospitalization/Surgery History - broken Ankle;Gastric Volvulus;T meniscus R + L knees;Broken left pelvis;Kidney stones. orn Medical A Surgical History Notes nd Ear/Nose/Mouth/Throat Esophagitis Cardiovascular Pt. has had Bradycardia Gastrointestinal Hx: Barrett Esophagus Genitourinary Renal cyst: Calculus of Kidney Musculoskeletal Also had Hx of Left Medial meniscus tear Review of Systems (ROS) Constitutional Symptoms (General Health) Denies complaints or symptoms of Fatigue, Fever, Chills, Marked Weight Change. Eyes Denies complaints or symptoms of Dry Eyes, Vision Changes, Glasses / Contacts. Respiratory Denies complaints or symptoms of Chronic or frequent coughs, Shortness of Breath. Endocrine Denies complaints or symptoms of Heat/cold intolerance. Integumentary (Skin) Complains or has symptoms of Wounds - Left Medial Lower leg. Neurologic Denies complaints or symptoms of Numbness/parasthesias. Psychiatric Denies complaints or symptoms of Claustrophobia, Suicidal. Objective Constitutional respirations regular, non-labored and within target range for patient.. Vitals Time Taken: 8:58 AM, Height: 62 in, Source: Stated, Weight: 162 lbs, Source: Stated, BMI: 29.6, Temperature: 97.8 F, Pulse: 82 bpm, Respiratory Rate: 16 breaths/min, Blood Pressure: 124/83 mmHg. Cardiovascular 2+ dorsalis pedis/posterior tibialis pulses. Psychiatric pleasant and cooperative. General Notes: Left lower extremity: T the anterior aspect there is an open wound with nonviable tissue throughout. Mild tenderness on exam. No signs of o surrounding infection. Venous stasis dermatitis and varicose veins noted. Integumentary (Hair, Skin) Wound #1 status is Open. Original cause of  wound was Gradually Appeared. The date acquired was: 12/04/2020. The wound is located on the Left,Anterior Lower Leg. The wound measures 2.3cm length x 1.5cm width x 0.1cm depth; 2.71cm^2 area and 0.271cm^3 volume. Assessment Active Problems ICD-10 Chronic venous hypertension (idiopathic) with ulcer of left lower extremity Non-pressure chronic ulcer of other part of left lower leg with fat layer exposed Patient presents with a 27-month history of nonhealing wound to her left lower extremity caused by trauma. Her ABIs were 1.06 on the left and she has strong bounding pedal pulses suggesting adequate blood flow. Her wound healing is impeded by venous insufficiency as noted on exam due to venous stasis dermatitis and varicose veins. At this time I recommended compression therapy. I debrided nonviable tissue. No signs of surrounding infection. I also recommended Hydrofera Blue along with gentamicin ointment to address any bioburden. She knows not to get this wrap wet and cannot keep this on for more than 7 days. 47 minutes was spent on the encounter including face-to-face, EMR review and coordination of care Procedures Wound #1 Pre-procedure diagnosis of Wound #1 is a Venous Leg Ulcer located on the Left,Anterior Lower Leg .Severity of Tissue Pre Debridement is: Fat layer exposed. There was a Excisional Skin/Subcutaneous Tissue Debridement with a total area of 3.45 sq cm performed by Geralyn Corwin, DO. With the following instrument(s): Curette to remove Non-Viable tissue/material. Material removed includes Subcutaneous Tissue and [facility]  and after achieving pain control using Other. No specimens were taken. A time out was conducted at 10:00, prior to the start of the procedure. A Minimum amount of bleeding was controlled with Pressure. The procedure was tolerated well with a pain level of 1 throughout and a pain level of 2 following the procedure. Post Debridement Measurements: 2.3cm length x 1.5cm  width x 0.1cm depth; 0.271cm^3 volume. Character of Wound/Ulcer Post Debridement is improved. Severity of Tissue Post Debridement is: Fat layer exposed. Post procedure Diagnosis Wound #1: Same as Pre-Procedure Plan 1. In office sharp debridement 2. Hydrofera Blue with gentamicin under Kerlix/Coban 3. Follow-up in 1 week Electronic Signature(s) Signed: 06/18/2021 10:23:08 AM By: Geralyn Corwin DO Entered By: Geralyn Corwin on 06/18/2021 10:20:24 -------------------------------------------------------------------------------- HxROS Details Patient Name: Date of Service: CA Molly Hogan Molly C. 06/18/2021 9:00 A M Medical Record Number: [id number] Patient Account Number: [id number] Date of Birth/Sex: Treating RN: [birth year] (71 y.o. Katrinka Blazing Primary Care Provider: Maurie Boettcher Other Clinician: Referring Provider: Treating Provider/Extender: Franchot Heidelberg in Treatment: 0 Information Obtained From Patient Constitutional Symptoms (General Health) Complaints and Symptoms: Negative for: Fatigue; Fever; Chills; Marked Weight Change Eyes Complaints and Symptoms: Negative for: Dry Eyes; Vision Changes; Glasses / Contacts Respiratory Complaints and Symptoms: Negative for: Chronic or frequent coughs; Shortness of Breath Endocrine Complaints and Symptoms: Negative for: Heat/cold intolerance Integumentary (Skin) Complaints and Symptoms: Positive for: Wounds - Left Medial Lower leg Neurologic Complaints and Symptoms: Negative for: Numbness/parasthesias Psychiatric Complaints and Symptoms: Negative for: Claustrophobia; Suicidal Ear/Nose/Mouth/Throat Medical History: Past Medical History Notes: Esophagitis Hematologic/Lymphatic Cardiovascular Medical History: Positive for: Arrhythmia - Hx: SVT Past Medical History Notes: Pt. has had Bradycardia Gastrointestinal Medical History: Past Medical History Notes: Hx: Barrett  Esophagus Genitourinary Medical History: Past Medical History Notes: Renal cyst: Calculus of Kidney Immunological Musculoskeletal Medical History: Positive for: Osteoarthritis - Right Knee Past Medical History Notes: Also had Hx of Left Medial meniscus tear Oncologic Immunizations Pneumococcal Vaccine: Received Pneumococcal Vaccination: Yes Received Pneumococcal Vaccination On or After 60th Birthday: Yes Implantable Devices No devices added Hospitalization / Surgery History Type of Hospitalization/Surgery broken Ankle;Gastric Volvulus;T meniscus R + L knees;Broken left pelvis;Kidney stones orn Family and Social History Unknown History: Yes; Never smoker; Marital Status - Married; Alcohol Use: Never; Drug Use: No History; Caffeine Use: Moderate - T or coffee; Financial ea Concerns: No; Food, Clothing or Shelter Needs: No; Support System Lacking: No; Transportation Concerns: No Electronic Signature(s) Signed: 06/18/2021 10:23:08 AM By: Geralyn Corwin DO Signed: 06/18/2021 2:06:14 PM By: Karie Schwalbe RN Entered By: Karie Schwalbe on 06/18/2021 09:25:57 -------------------------------------------------------------------------------- SuperBill Details Patient Name: Date of Service: CA Molly Hogan Molly C. 06/18/2021 Medical Record Number: [id number] Patient Account Number: [id number] Date of Birth/Sex: Treating RN: [birth year] (71 y.o. Roel Cluck Primary Care Provider: Maurie Boettcher Other Clinician: Referring Provider: Treating Provider/Extender: Franchot Heidelberg in Treatment: 0 Diagnosis Coding ICD-10 Codes Code Description [id number] Chronic venous hypertension (idiopathic) with ulcer of left lower extremity L97.822 Non-pressure chronic ulcer of other part of left lower leg with fat layer exposed Facility Procedures CPT4 Code: [id number] Description: 99213 - WOUND CARE VISIT-LEV 3 EST PT Modifier: Quantity: 1 CPT4 Code:  [id number] Description: 11042 - DEB SUBQ TISSUE 20 SQ CM/< ICD-10 Diagnosis Description I87.312 Chronic venous hypertension (idiopathic) with ulcer of left lower extremity L97.822 Non-pressure chronic ulcer of other part of left lower leg with fat layer expos Modifier: ed Quantity: 1 Physician Procedures : CPT4 Code Description Modifier [id number]  62952 - WC PHYS LEVEL 4 - NEW PT ICD-10 Diagnosis Description I87.312 Chronic venous hypertension (idiopathic) with ulcer of left lower extremity L97.822 Non-pressure chronic ulcer of other part of left lower leg  with fat layer exposed Quantity: 1 : [id number] 11042 - WC PHYS SUBQ TISS 20 SQ CM ICD-10 Diagnosis Description I87.312 Chronic venous hypertension (idiopathic) with ulcer of left lower extremity L97.822 Non-pressure chronic ulcer of other part of left lower leg with fat layer exposed Quantity: 1 Electronic Signature(s) Signed: 06/18/2021 2:06:14 PM By: Karie Schwalbe RN Signed: 06/18/2021 2:48:52 PM By: Geralyn Corwin DO Previous Signature: 06/18/2021 10:23:08 AM Version By: Geralyn Corwin DO Entered By: Karie Schwalbe on 06/18/2021 14:03:54

## 2021-06-25 ENCOUNTER — Encounter (HOSPITAL_BASED_OUTPATIENT_CLINIC_OR_DEPARTMENT_OTHER): Payer: Medicare Other | Admitting: Internal Medicine

## 2021-06-25 ENCOUNTER — Other Ambulatory Visit: Payer: Self-pay

## 2021-06-25 DIAGNOSIS — L97822 Non-pressure chronic ulcer of other part of left lower leg with fat layer exposed: Secondary | ICD-10-CM | POA: Diagnosis not present

## 2021-06-25 DIAGNOSIS — I872 Venous insufficiency (chronic) (peripheral): Secondary | ICD-10-CM | POA: Diagnosis not present

## 2021-06-25 DIAGNOSIS — I87312 Chronic venous hypertension (idiopathic) with ulcer of left lower extremity: Secondary | ICD-10-CM | POA: Diagnosis not present

## 2021-06-28 DIAGNOSIS — Z1231 Encounter for screening mammogram for malignant neoplasm of breast: Secondary | ICD-10-CM | POA: Diagnosis not present

## 2021-06-28 DIAGNOSIS — Z6831 Body mass index (BMI) 31.0-31.9, adult: Secondary | ICD-10-CM | POA: Diagnosis not present

## 2021-06-28 DIAGNOSIS — Z01419 Encounter for gynecological examination (general) (routine) without abnormal findings: Secondary | ICD-10-CM | POA: Diagnosis not present

## 2021-07-02 ENCOUNTER — Other Ambulatory Visit: Payer: Self-pay

## 2021-07-02 ENCOUNTER — Encounter (HOSPITAL_BASED_OUTPATIENT_CLINIC_OR_DEPARTMENT_OTHER): Payer: Medicare Other | Admitting: Internal Medicine

## 2021-07-02 DIAGNOSIS — L97822 Non-pressure chronic ulcer of other part of left lower leg with fat layer exposed: Secondary | ICD-10-CM

## 2021-07-02 DIAGNOSIS — I872 Venous insufficiency (chronic) (peripheral): Secondary | ICD-10-CM | POA: Diagnosis not present

## 2021-07-02 DIAGNOSIS — I87312 Chronic venous hypertension (idiopathic) with ulcer of left lower extremity: Secondary | ICD-10-CM | POA: Diagnosis not present

## 2021-07-02 NOTE — Progress Notes (Signed)
Molly Hogan ([id number]) , Visit Report for 07/02/2021 Arrival Information Details Patient Name: Date of Service: Molly Hogan 0/02/2724 9:00 A M Medical Record Number: [id number] Patient Account Number: [id number] Date of Birth/Sex: Treating RN: Aug 14, 1950 (71 y.o. [name] Primary Care Arantxa Piercey: Clydie Braun Other Clinician: Referring Tal Kempker: Treating Makenzie Vittorio/Extender: Beckey Downing in Treatment: 2 Visit Information History Since Last Visit Added or deleted any medications: No Patient Arrived: Ambulatory Any new allergies or adverse reactions: No Arrival Time: 08:59 Had a fall or experienced change in No Transfer Assistance: None activities of daily living that may affect Patient Identification Verified: Yes risk of falls: Secondary Verification Process Completed: Yes Signs or symptoms of abuse/neglect since last visito No Patient Requires Transmission-Based Precautions: No Hospitalized since last visit: No Patient Has Alerts: No Has Dressing in Place as Prescribed: Yes Has Compression in Place as Prescribed: Yes Pain Present Now: No Electronic Signature(s) Signed: 07/02/2021 11:32:19 AM By: Sharyn Creamer RN, BSN Entered By: Sharyn Creamer on 07/02/2021 09:00:32 -------------------------------------------------------------------------------- Compression Therapy Details Patient Name: Date of Service: Molly [name] [name] 08/28/9561 9:00 A M Medical Record Number: [id number] Patient Account Number: [id number] Date of Birth/Sex: Treating RN: April 28, 1951 (71 y.o. Molly Hogan Primary Care Dinora Hemm: Clydie Braun Other Clinician: Referring Kerria Sapien: Treating Sparsh Callens/Extender: Beckey Downing in Treatment: 2 Compression Therapy Performed for Wound Assessment: Wound #1 Left,Anterior Lower Leg Performed By: Clinician Baruch Gouty, RN Compression Type: Three Layer Post  Procedure Diagnosis Same as Pre-procedure Electronic Signature(s) Signed: 07/02/2021 4:54:19 PM By: Baruch Gouty RN, BSN Entered By: Baruch Gouty on 07/02/2021 09:16:39 -------------------------------------------------------------------------------- Encounter Discharge Information Details Patient Name: Date of Service: Molly [name] [name] 09/21/8414 9:00 A M Medical Record Number: [id number] Patient Account Number: [id number] Date of Birth/Sex: Treating RN: 1950-12-06 (71 y.o. Molly Hogan Primary Care [place] Kuang: Clydie Braun Other Clinician: Referring Constantino Starace: Treating Molly Hogan/Extender: Beckey Downing in Treatment: 2 Encounter Discharge Information Items Post Procedure Vitals Discharge Condition: Stable Temperature (F): 97.9 Ambulatory Status: Ambulatory Pulse (bpm): 61 Discharge Destination: Home Respiratory Rate (breaths/min): 18 Transportation: Private Auto Blood Pressure (mmHg): 133/83 Accompanied By: self Schedule Follow-up Appointment: Yes Clinical Summary of Care: Patient Declined Electronic Signature(s) Signed: 07/02/2021 4:54:19 PM By: Baruch Gouty RN, BSN Entered By: Baruch Gouty on 07/02/2021 09:43:31 -------------------------------------------------------------------------------- Lower Extremity Assessment Details Patient Name: Date of Service: Molly [name] [name] 0/93/2355 9:00 A M Medical Record Number: [id number] Patient Account Number: [id number] Date of Birth/Sex: Treating RN: [birth year] (71 y.o. [name] Primary Care Ellard Nan: Clydie Braun Other Clinician: Referring Reyne Falconi: Treating Molly Hogan/Extender: Artis Delay Weeks in Treatment: 2 Edema Assessment Assessed: [Left: No] [Right: No] Edema: [Left: Ye] [Right: s] Calf Left: Right: Point of Measurement: 31 cm From Medial Instep 33.7 cm Ankle Left: Right: Point of Measurement: 11 cm From Medial Instep  21.3 cm Vascular Assessment Pulses: Dorsalis Pedis Palpable: [Left:Yes] Electronic Signature(s) Signed: 07/02/2021 11:32:19 AM By: Sharyn Creamer RN, BSN Signed: 07/02/2021 4:54:19 PM By: Baruch Gouty RN, BSN Entered By: Baruch Gouty on 07/02/2021 09:12:15 -------------------------------------------------------------------------------- Multi Wound Chart Details Patient Name: Date of Service: Molly [name] [name] 11/09/2374 9:00 A M Medical Record Number: [id number] Patient Account Number: [id number] Date of Birth/Sex: Treating RN: 06-07-1950 (71 y.o. F) Primary Care Molly Hogan: Clydie Braun Other Clinician: Referring Molly Hogan: Treating Naraly Fritcher/Extender: Beckey Downing in Treatment: 2 Vital Signs Height(in): 62 Pulse(bpm): 61 Weight(lbs): 162 Blood Pressure(mmHg): 133/83 Body Mass Index(BMI):  29.6 Temperature(F): 97.9 Respiratory Rate(breaths/min): 18 Photos: [N/A:N/A] Left, Anterior Lower Leg N/A N/A Wound Location: Gradually Appeared N/A N/A Wounding Event: Venous Leg Ulcer N/A N/A Primary Etiology: Arrhythmia, Osteoarthritis N/A N/A Comorbid History: 12/04/2020 N/A N/A Date Acquired: 2 N/A N/A Weeks of Treatment: Open N/A N/A Wound Status: No N/A N/A Wound Recurrence: 1.7x1.2x0.1 N/A N/A Measurements L x W x D (cm) 1.602 N/A N/A A (cm) : rea 0.16 N/A N/A Volume (cm) : 40.90% N/A N/A % Reduction in A rea: 41.00% N/A N/A % Reduction in Volume: Full Thickness With Exposed Support N/A N/A Classification: Structures Medium N/A N/A Exudate A mount: Serosanguineous N/A N/A Exudate Type: red, brown N/A N/A Exudate Color: Distinct, outline attached N/A N/A Wound Margin: Medium (34-66%) N/A N/A Granulation A mount: Red, Pink N/A N/A Granulation Quality: Medium (34-66%) N/A N/A Necrotic A mount: Fat Layer (Subcutaneous Tissue): Yes N/A N/A Exposed Structures: Fascia: No Tendon: No Muscle: No Joint:  No Bone: No Small (1-33%) N/A N/A Epithelialization: Debridement - Excisional N/A N/A Debridement: Pre-procedure Verification/Time Out 09:15 N/A N/A Taken: Lidocaine 4% T opical Solution N/A N/A Pain Control: Subcutaneous, Slough N/A N/A Tissue Debrided: Skin/Subcutaneous Tissue N/A N/A Level: 2.04 N/A N/A Debridement A (sq cm): rea Curette N/A N/A Instrument: Minimum N/A N/A Bleeding: Silver Nitrate N/A N/A Hemostasis A chieved: 4 N/A N/A Procedural Pain: 2 N/A N/A Post Procedural Pain: Procedure was tolerated well N/A N/A Debridement Treatment Response: 1.7x1.2x0.1 N/A N/A Post Debridement Measurements L x W x D (cm) 0.16 N/A N/A Post Debridement Volume: (cm) Compression Therapy N/A N/A Procedures Performed: Debridement Treatment Notes Electronic Signature(s) Signed: 07/02/2021 9:34:33 AM By: Kalman Shan DO Entered By: Kalman Shan on 07/02/2021 09:30:33 -------------------------------------------------------------------------------- Multi-Disciplinary Care Plan Details Patient Name: Date of Service: Molly [name] [name] 4/48/1856 9:00 A M Medical Record Number: [id number] Patient Account Number: [id number] Date of Birth/Sex: Treating RN: 1950/12/12 (71 y.o. Molly Hogan Primary Care Glenn Christo: Clydie Braun Other Clinician: Referring Merlen Gurry: Treating Cadynce Garrette/Extender: Beckey Downing in Treatment: 2 Multidisciplinary Care Plan reviewed with physician Active Inactive Pain, Acute or Chronic Nursing Diagnoses: Pain, acute or chronic: actual or potential Goals: Patient/caregiver will verbalize comfort level met Date Initiated: 06/18/2021 Target Resolution Date: 07/16/2021 Goal Status: Active Interventions: Complete pain assessment as per visit requirements Notes: Venous Leg Ulcer Nursing Diagnoses: Potential for venous Insuffiency (use before diagnosis confirmed) Goals: Patient/caregiver will  verbalize understanding of disease process and disease management Date Initiated: 06/18/2021 Target Resolution Date: 07/16/2021 Goal Status: Active Interventions: Provide education on venous insufficiency Notes: Electronic Signature(s) Signed: 07/02/2021 4:54:19 PM By: Baruch Gouty RN, BSN Entered By: Baruch Gouty on 07/02/2021 09:13:46 -------------------------------------------------------------------------------- Pain Assessment Details Patient Name: Date of Service: Molly [name] [name] 7/85/8850 9:00 A M Medical Record Number: [id number] Patient Account Number: [id number] Date of Birth/Sex: Treating RN: 1951/02/09 (71 y.o. [name] Primary Care Lizmary Nader: Clydie Braun Other Clinician: Referring Jaidy Cottam: Treating Jaylani Mcguinn/Extender: Beckey Downing in Treatment: 2 Active Problems Location of Pain Severity and Description of Pain Patient Has Paino No Patient Has Paino No Site Locations Pain Management and Medication Current Pain Management: Electronic Signature(s) Signed: 07/02/2021 11:32:19 AM By: Sharyn Creamer RN, BSN Entered By: Sharyn Creamer on 07/02/2021 09:02:29 -------------------------------------------------------------------------------- Patient/Caregiver Education Details Patient Name: Date of Service: Molly Hogan 6/76/[id number]:66 [place] Record Number: [id number] Patient Account Number: [id number] Date of Birth/Gender: Treating RN: 01-Nov-1950 (71 y.o. Molly Hogan Primary Care Physician: Clydie Braun Other  Clinician: Referring Physician: Treating Physician/Extender: Beckey Downing in Treatment: 2 Education Assessment Education Provided To: Patient Education Topics Provided Venous: Methods: Explain/Verbal Responses: Reinforcements needed, State content correctly Electronic Signature(s) Signed: 07/02/2021 4:54:19 PM By: Baruch Gouty RN,  BSN Entered By: Baruch Gouty on 07/02/2021 09:15:08 -------------------------------------------------------------------------------- Wound Assessment Details Patient Name: Date of Service: Molly [name] [name] 10/26/6331 9:00 A M Medical Record Number: [id number] Patient Account Number: [id number] Date of Birth/Sex: Treating RN: November 09, 1950 (71 y.o. [name] Primary Care Anais Koenen: Clydie Braun Other Clinician: Referring Shermaine Brigham: Treating Delia Slatten/Extender: Artis Delay Weeks in Treatment: 2 Wound Status Wound Number: 1 Primary Etiology: Venous Leg Ulcer Wound Location: Left, Anterior Lower Leg Wound Status: Open Wounding Event: Gradually Appeared Comorbid History: Arrhythmia, Osteoarthritis Date Acquired: 12/04/2020 Weeks Of Treatment: 2 Clustered Wound: No Photos Wound Measurements Length: (cm) 1.7 Width: (cm) 1.2 Depth: (cm) 0.1 Area: (cm) 1.602 Volume: (cm) 0.16 % Reduction in Area: 40.9% % Reduction in Volume: 41% Epithelialization: Small (1-33%) Tunneling: No Undermining: No Wound Description Classification: Full Thickness With Exposed Support Structures Wound Margin: Distinct, outline attached Exudate Amount: Medium Exudate Type: Serosanguineous Exudate Color: red, brown Foul Odor After Cleansing: No Slough/Fibrino Yes Wound Bed Granulation Amount: Medium (34-66%) Exposed Structure Granulation Quality: Red, Pink Fascia Exposed: No Necrotic Amount: Medium (34-66%) Fat Layer (Subcutaneous Tissue) Exposed: Yes Necrotic Quality: Adherent Slough Tendon Exposed: No Muscle Exposed: No Joint Exposed: No Bone Exposed: No Treatment Notes Wound #1 (Lower Leg) Wound Laterality: Left, Anterior Cleanser Soap and Water Discharge Instruction: May shower and wash wound with dial antibacterial soap and water prior to dressing change. Wound Cleanser Discharge Instruction: Cleanse the wound with wound cleanser prior to  applying a clean dressing using gauze sponges, not tissue or cotton balls. Peri-Wound Care Topical Gentamicin Discharge Instruction: As directed by physician Primary Dressing Hydrofera Blue Ready Foam, 2.5 x2.5 in Discharge Instruction: Apply to wound bed as instructed Secondary Dressing Woven Gauze Sponge, Non-Sterile 4x4 in Discharge Instruction: Apply over primary dressing as directed. Secured With Compression Wrap ThreePress (3 layer compression wrap) Discharge Instruction: Apply three layer compression as directed. Compression Stockings Add-Ons Electronic Signature(s) Signed: 07/02/2021 11:32:19 AM By: Sharyn Creamer RN, BSN Entered By: Sharyn Creamer on 07/02/2021 09:10:35 -------------------------------------------------------------------------------- Vitals Details Patient Name: Date of Service: Molly [name] [name] 9/37/3428 9:00 A M Medical Record Number: [id number] Patient Account Number: [id number] Date of Birth/Sex: Treating RN: 13-Nov-1950 (71 y.o. [name] Primary Care Myrka Sylva: Clydie Braun Other Clinician: Referring Karly Pitter: Treating Lasheka Kempner/Extender: Beckey Downing in Treatment: 2 Vital Signs Time Taken: 09:01 Temperature (F): 97.9 Height (in): 62 Pulse (bpm): 61 Weight (lbs): 162 Respiratory Rate (breaths/min): 18 Body Mass Index (BMI): 29.6 Blood Pressure (mmHg): 133/83 Reference Range: 80 - 120 mg / dl Electronic Signature(s) Signed: 07/02/2021 11:32:19 AM By: Sharyn Creamer RN, BSN Entered By: Sharyn Creamer on 07/02/2021 20:35:59

## 2021-07-02 NOTE — Progress Notes (Signed)
Molly Hogan ([id number]) , Visit Report for 07/02/2021 Chief Complaint Document Details Patient Name: Date of Service: Molly Hogan 07/02/2021 9:00 A M Medical Record Number: [id number] Patient Account Number: [id number] Date of Birth/Sex: Treating RN: [birth year] (71 y.o. F) Primary Care Provider: Maurie Boettcher Other Clinician: Referring Provider: Treating Provider/Extender: Franchot Heidelberg in Treatment: 2 Information Obtained from: Patient Chief Complaint Left lower extremity wound Electronic Signature(s) Signed: 07/02/2021 9:34:33 AM By: Geralyn Corwin DO Entered By: Geralyn Corwin on 07/02/2021 09:30:40 -------------------------------------------------------------------------------- Debridement Details Patient Name: Date of Service: Molly Molly Hogan [name] 07/02/2021 9:00 A M Medical Record Number: [id number] Patient Account Number: [id number] Date of Birth/Sex: Treating RN: 07-06-1950 (71 y.o. [place], Linda Primary Care Provider: Maurie Boettcher Other Clinician: Referring Provider: Treating Provider/Extender: Franchot Heidelberg in Treatment: 2 Debridement Performed for Assessment: Wound #1 Left,Anterior Lower Leg Performed By: Physician Geralyn Corwin, DO Debridement Type: Debridement Severity of Tissue Pre Debridement: Fat layer exposed Level of Consciousness (Pre-procedure): Awake and Alert Pre-procedure Verification/Time Out Yes - 09:15 Taken: Start Time: 09:25 Pain Control: Lidocaine 4% T opical Solution T Area Debrided (L x W): otal 1.7 (cm) x 1.2 (cm) = 2.04 (cm) Tissue and other material debrided: Viable, Non-Viable, Slough, Subcutaneous, Slough Level: Skin/Subcutaneous Tissue Debridement Description: Excisional Instrument: Curette Bleeding: Minimum Hemostasis Achieved: Silver Nitrate Procedural Pain: 4 Post Procedural Pain: 2 Response to Treatment: Procedure was tolerated  well Level of Consciousness (Post- Awake and Alert procedure): Post Debridement Measurements of Total Wound Length: (cm) 1.7 Width: (cm) 1.2 Depth: (cm) 0.1 Volume: (cm) 0.16 Character of Wound/Ulcer Post Debridement: Improved Severity of Tissue Post Debridement: Fat layer exposed Post Procedure Diagnosis Same as Pre-procedure Electronic Signature(s) Signed: 07/02/2021 9:34:33 AM By: Geralyn Corwin DO Signed: 07/02/2021 4:54:19 PM By: Zenaida Deed RN, BSN Entered By: Zenaida Deed on 07/02/2021 09:28:42 -------------------------------------------------------------------------------- HPI Details Patient Name: Date of Service: Molly Molly Hogan [name] 07/02/2021 9:00 A M Medical Record Number: [id number] Patient Account Number: [id number] Date of Birth/Sex: Treating RN: [birth year] (71 y.o. F) Primary Care Provider: Maurie Boettcher Other Clinician: Referring Provider: Treating Provider/Extender: Franchot Heidelberg in Treatment: 2 History of Present Illness HPI Description: Admission 06/18/2021 Molly Hogan is a 70 year old female with a past medical history of chronic venous insufficiency that presents to the clinic for a 58-month history of nonhealing wound to her left lower extremity. She states this happened when she had a dishwasher door. She has been following with Dr. Lequita Halt at [facility] for this issue. She started with calcium alginate and Adaptic and now is currently using Medihoney. She reports tenderness to the wound site. She denies systemic signs of infection. She has compression stockings but does not wear these daily. 2/20; patient presents for follow-up. She has no issues or complaints today. She denies signs of infection. She tolerated the compression wrap well. 2/27; patient presents for follow-up. She tolerated the 3 layer compression wrap well. She has no issues or complaints today. Electronic Signature(s) Signed:  07/02/2021 9:34:33 AM By: Geralyn Corwin DO Entered By: Geralyn Corwin on 07/02/2021 09:31:03 -------------------------------------------------------------------------------- Physical Exam Details Patient Name: Date of Service: Molly Molly Hogan [name] 07/02/2021 9:00 A M Medical Record Number: [id number] Patient Account Number: [id number] Date of Birth/Sex: Treating RN: 1950/09/13 (71 y.o. F) Primary Care Provider: Maurie Boettcher Other Clinician: Referring Provider: Treating Provider/Extender: Franchot Heidelberg in Treatment: 2 Constitutional respirations regular, non-labored and within target range for patient.. Cardiovascular  2+ dorsalis pedis/posterior tibialis pulses. Psychiatric pleasant and cooperative. Notes Left lower extremity: T the anterior aspect there is an open wound with nonviable tissue, granulation tissue and hypergranulation noted. No surrounding signs o of infection. Venous stasis dermatitis and varicose veins noted. Good edema control. Electronic Signature(s) Signed: 07/02/2021 9:34:33 AM By: Geralyn Corwin DO Entered By: Geralyn Corwin on 07/02/2021 09:32:29 -------------------------------------------------------------------------------- Physician Orders Details Patient Name: Date of Service: Molly Molly Hogan [name] 07/02/2021 9:00 A M Medical Record Number: [id number] Patient Account Number: [id number] Date of Birth/Sex: Treating RN: 1950-08-24 (71 y.o. Molly Hogan Primary Care Provider: Maurie Boettcher Other Clinician: Referring Provider: Treating Provider/Extender: Franchot Heidelberg in Treatment: 2 Verbal / Phone Orders: No Diagnosis Coding ICD-10 Coding Code Description [phone] Chronic venous hypertension (idiopathic) with ulcer of left lower extremity L97.822 Non-pressure chronic ulcer of other part of left lower leg with fat layer exposed Follow-up Appointments ppointment in 1  week. - Dr Mikey Bussing w/ Maryr[facility] Bun in room # 6 Return A Bathing/ Shower/ Hygiene May shower with protection but do not get wound dressing(s) wet. - *** If showering, please use cast protector*** These can be purchased from local pharmacies such as [facility] or [facility] Edema Control - Lymphedema / SCD / Other Bilateral Lower Extremities Elevate legs to the level of the heart or above for 30 minutes daily and/or when sitting, a frequency of: Avoid standing for long periods of time. Patient to wear own compression stockings every day. - Right leg Exercise regularly Wound Treatment Wound #1 - Lower Leg Wound Laterality: Left, Anterior Cleanser: Soap and Water 1 x Per Week Discharge Instructions: May shower and wash wound with dial antibacterial soap and water prior to dressing change. Cleanser: Wound Cleanser 1 x Per Week Discharge Instructions: Cleanse the wound with wound cleanser prior to applying a clean dressing using gauze sponges, not tissue or cotton balls. Topical: Gentamicin 1 x Per Week Discharge Instructions: As directed by physician Prim Dressing: Hydrofera Blue Ready Foam, 2.5 x2.5 in 1 x Per Week ary Discharge Instructions: Apply to wound bed as instructed Secondary Dressing: Woven Gauze Sponge, Non-Sterile 4x4 in 1 x Per Week Discharge Instructions: Apply over primary dressing as directed. Compression Wrap: ThreePress (3 layer compression wrap) 1 x Per Week Discharge Instructions: Apply three layer compression as directed. Patient Medications llergies: No Known Allergies A Notifications Medication Indication Start End prior to debridement 07/02/2021 lidocaine DOSE topical 4 % cream - cream topical Electronic Signature(s) Signed: 07/02/2021 9:34:33 AM By: Geralyn Corwin DO Entered By: Geralyn Corwin on 07/02/2021 09:32:43 -------------------------------------------------------------------------------- Problem List Details Patient Name: Date of Service: Molly Molly Hogan NNIE  C. 07/02/2021 9:00 A M Medical Record Number: [id number] Patient Account Number: [id number] Date of Birth/Sex: Treating RN: 31-Dec-1950 (71 y.o. Molly Hogan Primary Care Provider: Maurie Boettcher Other Clinician: Referring Provider: Treating Provider/Extender: Franchot Heidelberg in Treatment: 2 Active Problems ICD-10 Encounter Code Description Active Date MDM Diagnosis I87.312 Chronic venous hypertension (idiopathic) with ulcer of left lower extremity 06/18/2021 No Yes L97.822 Non-pressure chronic ulcer of other part of left lower leg with fat layer exposed2/13/2023 No Yes Inactive Problems Resolved Problems Electronic Signature(s) Signed: 07/02/2021 9:34:33 AM By: Geralyn Corwin DO Entered By: Geralyn Corwin on 07/02/2021 09:30:28 -------------------------------------------------------------------------------- Progress Note Details Patient Name: Date of Service: Molly Molly Hogan [name] 07/02/2021 9:00 A M Medical Record Number: [id number] Patient Account Number: [id number] Date of Birth/Sex: Treating RN: 06-Oct-1950 (71 y.o. F) Primary Care Provider: Maurie Boettcher Other Clinician:  Referring Provider: Treating Provider/Extender: Franchot Heidelberg in Treatment: 2 Subjective Chief Complaint Information obtained from Patient Left lower extremity wound History of Present Illness (HPI) Admission 06/18/2021 Ms. Molly Hogan is a 71 year old female with a past medical history of chronic venous insufficiency that presents to the clinic for a 46-month history of nonhealing wound to her left lower extremity. She states this happened when she had a dishwasher door. She has been following with Dr. Lequita Halt at [facility] for this issue. She started with calcium alginate and Adaptic and now is currently using Medihoney. She reports tenderness to the wound site. She denies systemic signs of infection. She has compression  stockings but does not wear these daily. 2/20; patient presents for follow-up. She has no issues or complaints today. She denies signs of infection. She tolerated the compression wrap well. 2/27; patient presents for follow-up. She tolerated the 3 layer compression wrap well. She has no issues or complaints today. Patient History Information obtained from Patient. Family History Unknown History. Social History Never smoker, Marital Status - Married, Alcohol Use - Never, Drug Use - No History, Caffeine Use - Moderate - T or coffee. ea Medical History Cardiovascular Patient has history of Arrhythmia - Hx: SVT Musculoskeletal Patient has history of Osteoarthritis - Right Knee Hospitalization/Surgery History - broken Ankle;Gastric Volvulus;T meniscus R + L knees;Broken left pelvis;Kidney stones. orn Medical A Surgical History Notes nd Ear/Nose/Mouth/Throat Esophagitis Cardiovascular Pt. has had Bradycardia Gastrointestinal Hx: Barrett Esophagus Genitourinary Renal cyst: Calculus of Kidney Musculoskeletal Also had Hx of Left Medial meniscus tear Objective Constitutional respirations regular, non-labored and within target range for patient.. Vitals Time Taken: 9:01 AM, Height: 62 in, Weight: 162 lbs, BMI: 29.6, Temperature: 97.9 F, Pulse: 61 bpm, Respiratory Rate: 18 breaths/min, Blood Pressure: 133/83 mmHg. Cardiovascular 2+ dorsalis pedis/posterior tibialis pulses. Psychiatric pleasant and cooperative. General Notes: Left lower extremity: T the anterior aspect there is an open wound with nonviable tissue, granulation tissue and hypergranulation noted. No o surrounding signs of infection. Venous stasis dermatitis and varicose veins noted. Good edema control. Integumentary (Hair, Skin) Wound #1 status is Open. Original cause of wound was Gradually Appeared. The date acquired was: 12/04/2020. The wound has been in treatment 2 weeks. The wound is located on the Left,Anterior Lower  Leg. The wound measures 1.7cm length x 1.2cm width x 0.1cm depth; 1.602cm^2 area and 0.16cm^3 volume. There is Fat Layer (Subcutaneous Tissue) exposed. There is no tunneling or undermining noted. There is a medium amount of serosanguineous drainage noted. The wound margin is distinct with the outline attached to the wound base. There is medium (34-66%) red, pink granulation within the wound bed. There is a medium (34-66%) amount of necrotic tissue within the wound bed including Adherent Slough. Assessment Active Problems ICD-10 Chronic venous hypertension (idiopathic) with ulcer of left lower extremity Non-pressure chronic ulcer of other part of left lower leg with fat layer exposed Patient's wound has shown improvement in size and appearance since last clinic visit. I debrided nonviable tissue. I recommended continuing with gentamicin and Hydrofera Blue under 3 layer compression. A silver nitrate to the hyper granulated areas. Follow-up in 1 week. Procedures Wound #1 Pre-procedure diagnosis of Wound #1 is a Venous Leg Ulcer located on the Left,Anterior Lower Leg .Severity of Tissue Pre Debridement is: Fat layer exposed. There was a Excisional Skin/Subcutaneous Tissue Debridement with a total area of 2.04 sq cm performed by Geralyn Corwin, DO. With the following instrument(s): Curette to remove Viable and Non-Viable tissue/material.  Material removed includes Subcutaneous Tissue and Slough and after achieving pain control using Lidocaine 4% T opical Solution. No specimens were taken. A time out was conducted at 09:15, prior to the start of the procedure. A Minimum amount of bleeding was controlled with Silver Nitrate. The procedure was tolerated well with a pain level of 4 throughout and a pain level of 2 following the procedure. Post Debridement Measurements: 1.7cm length x 1.2cm width x 0.1cm depth; 0.16cm^3 volume. Character of Wound/Ulcer Post Debridement is improved. Severity of Tissue Post  Debridement is: Fat layer exposed. Post procedure Diagnosis Wound #1: Same as Pre-Procedure Pre-procedure diagnosis of Wound #1 is a Venous Leg Ulcer located on the Left,Anterior Lower Leg . There was a Three Layer Compression Therapy Procedure by Zenaida Deed, RN. Post procedure Diagnosis Wound #1: Same as Pre-Procedure Plan Follow-up Appointments: Return Appointment in 1 week. - Dr Mikey Bussing w/ Maryruth Bun in room # 6 Bathing/ Shower/ Hygiene: May shower with protection but do not get wound dressing(s) wet. - *** If showering, please use cast protector*** These can be purchased from local pharmacies such as [facility] or [facility] Edema Control - Lymphedema / SCD / Other: Elevate legs to the level of the heart or above for 30 minutes daily and/or when sitting, a frequency of: Avoid standing for long periods of time. Patient to wear own compression stockings every day. - Right leg Exercise regularly The following medication(s) was prescribed: lidocaine topical 4 % cream cream topical for prior to debridement was prescribed at facility WOUND #1: - Lower Leg Wound Laterality: Left, Anterior Cleanser: Soap and Water 1 x Per Week/ Discharge Instructions: May shower and wash wound with dial antibacterial soap and water prior to dressing change. Cleanser: Wound Cleanser 1 x Per Week/ Discharge Instructions: Cleanse the wound with wound cleanser prior to applying a clean dressing using gauze sponges, not tissue or cotton balls. Topical: Gentamicin 1 x Per Week/ Discharge Instructions: As directed by physician Prim Dressing: Hydrofera Blue Ready Foam, 2.5 x2.5 in 1 x Per Week/ ary Discharge Instructions: Apply to wound bed as instructed Secondary Dressing: Woven Gauze Sponge, Non-Sterile 4x4 in 1 x Per Week/ Discharge Instructions: Apply over primary dressing as directed. Com pression Wrap: ThreePress (3 layer compression wrap) 1 x Per Week/ Discharge Instructions: Apply three layer compression as  directed. 1. In office sharp debridement 2. Gentamicin ointment and Hydrofera Blue under 3 layer compression 3. Follow-up in 1 week Electronic Signature(s) Signed: 07/02/2021 9:34:33 AM By: Geralyn Corwin DO Entered By: Geralyn Corwin on 07/02/2021 09:34:11 -------------------------------------------------------------------------------- HxROS Details Patient Name: Date of Service: Molly Molly Hogan [name] 07/02/2021 9:00 A M Medical Record Number: [id number] Patient Account Number: [id number] Date of Birth/Sex: Treating RN: [birth year] (71 y.o. F) Primary Care Provider: Maurie Boettcher Other Clinician: Referring Provider: Treating Provider/Extender: Franchot Heidelberg in Treatment: 2 Information Obtained From Patient Ear/Nose/Mouth/Throat Medical History: Past Medical History Notes: Esophagitis Cardiovascular Medical History: Positive for: Arrhythmia - Hx: SVT Past Medical History Notes: Pt. has had Bradycardia Gastrointestinal Medical History: Past Medical History Notes: Hx: Barrett Esophagus Genitourinary Medical History: Past Medical History Notes: Renal cyst: Calculus of Kidney Musculoskeletal Medical History: Positive for: Osteoarthritis - Right Knee Past Medical History Notes: Also had Hx of Left Medial meniscus tear Immunizations Pneumococcal Vaccine: Received Pneumococcal Vaccination: Yes Received Pneumococcal Vaccination On or After 60th Birthday: Yes Implantable Devices No devices added Hospitalization / Surgery History Type of Hospitalization/Surgery broken Ankle;Gastric Volvulus;T meniscus R + L knees;Broken left pelvis;Kidney stones  orn Family and Social History Unknown History: Yes; Never smoker; Marital Status - Married; Alcohol Use: Never; Drug Use: No History; Caffeine Use: Moderate - T or coffee; Financial ea Concerns: No; Food, Clothing or Shelter Needs: No; Support System Lacking: No; Transportation Concerns:  No Electronic Signature(s) Signed: 07/02/2021 9:34:33 AM By: [name], Jacy Brocker DO Entered By: [name], Morgin Halls on 07/02/2021 09:31:09 -------------------------------------------------------------------------------- SuperBill Details Patient Name: Date of Service: Molly Molly DoughtyLLICUTT, [name] 07/02/2021 Medical Record Number: [id number] Patient Account Number: [id number] Date of Birth/Sex: Treating RN: February 11, 1951 (71 y.o. Molly StandardF) Boehlein, Linda Primary Care Provider: [name], Meredith Other Clinician: Referring Provider: Treating Provider/Extender: Franchot HeidelbergHoffman, Banjamin Stovall Mitchell, Meredith Weeks in Treatment: 2 Diagnosis Coding ICD-10 Codes Code Description [phone] Chronic venous hypertension (idiopathic) with ulcer of left lower extremity L97.822 Non-pressure chronic ulcer of other part of left lower leg with fat layer exposed Facility Procedures CPT4 Code: [id number] 11 IC Description: 042 - DEB SUBQ TISSUE 20 SQ CM/< D-10 Diagnosis Description L97.822 Non-pressure chronic ulcer of other part of left lower leg with fat layer exp Modifier: 1 osed Quantity: Physician Procedures : CPT4 Code Description Modifier [id number] 11042 - WC PHYS SUBQ TISS 20 SQ CM ICD-10 Diagnosis Description L97.822 Non-pressure chronic ulcer of other part of left lower leg with fat layer exposed Quantity: 1 Electronic Signature(s) Signed: 07/02/2021 9:34:33 AM By: [name], Azyiah Bo DO Entered By: [name], Arnola Crittendon on 07/02/2021 09:34:19

## 2021-07-05 NOTE — Progress Notes (Signed)
SHELLSEA BORUNDA ([id number]) , Visit Report for 06/25/2021 Arrival Information Details Patient Name: Date of Service: [place] [name] 11/28/3662 8:45 A M Medical Record Number: [id number] Patient Account Number: [id number] Date of Birth/Sex: Treating RN: 09-Oct-1950 (71 y.o. Tonita Phoenix, Lauren Primary Care Pistol Kessenich: Clydie Braun Other Clinician: Referring Dmiya Malphrus: Treating Marylen Zuk/Extender: Beckey Downing in Treatment: 1 Visit Information History Since Last Visit Added or deleted any medications: No Patient Arrived: Ambulatory Any new allergies or adverse reactions: No Arrival Time: 09:03 Had a fall or experienced change in No Accompanied By: self activities of daily living that may affect Transfer Assistance: None risk of falls: Patient Identification Verified: Yes Signs or symptoms of abuse/neglect since last visito No Secondary Verification Process Completed: Yes Hospitalized since last visit: No Patient Requires Transmission-Based Precautions: No Implantable device outside of the clinic excluding No Patient Has Alerts: No cellular tissue based products placed in the center since last visit: Has Dressing in Place as Prescribed: Yes Has Compression in Place as Prescribed: Yes Pain Present Now: No Electronic Signature(s) Signed: 06/25/2021 5:21:26 PM By: Rhae Hammock RN Entered By: Rhae Hammock on 06/25/2021 09:03:52 -------------------------------------------------------------------------------- Encounter Discharge Information Details Patient Name: Date of Service: [name] [name] 5/63/8756 8:45 A M Medical Record Number: [id number] Patient Account Number: [id number] Date of Birth/Sex: Treating RN: 1950/08/07 (71 y.o. Tonita Phoenix, Lauren Primary Care Irma Roulhac: Clydie Braun Other Clinician: Referring Cacey Willow: Treating Latina Frank/Extender: Beckey Downing in Treatment:  1 Encounter Discharge Information Items Post Procedure Vitals Discharge Condition: Stable Temperature (F): 98.7 Ambulatory Status: Ambulatory Pulse (bpm): 74 Discharge Destination: Home Respiratory Rate (breaths/min): 17 Transportation: Private Auto Blood Pressure (mmHg): 134/74 Accompanied By: self Schedule Follow-up Appointment: Yes Clinical Summary of Care: Patient Declined Electronic Signature(s) Signed: 06/25/2021 5:21:26 PM By: Rhae Hammock RN Entered By: Rhae Hammock on 06/25/2021 09:24:55 -------------------------------------------------------------------------------- Lower Extremity Assessment Details Patient Name: Date of Service: [name] [name] 08/19/6061 8:45 A M Medical Record Number: [id number] Patient Account Number: [id number] Date of Birth/Sex: Treating RN: 1951-04-06 (71 y.o. Tonita Phoenix, Lauren Primary Care Seriyah Collison: Clydie Braun Other Clinician: Referring Banesa Tristan: Treating Jadelynn Boylan/Extender: Artis Delay Weeks in Treatment: 1 Edema Assessment Assessed: [Left: Yes] [Right: No] Edema: [Left: Ye] [Right: s] Calf Left: Right: Point of Measurement: 31 cm From Medial Instep 37.3 cm Ankle Left: Right: Point of Measurement: 11 cm From Medial Instep 21.3 cm Vascular Assessment Pulses: Dorsalis Pedis Palpable: [Left:Yes] Posterior Tibial Palpable: [Left:Yes] Electronic Signature(s) Signed: 06/25/2021 5:21:26 PM By: Rhae Hammock RN Entered By: Rhae Hammock on 06/25/2021 09:04:32 -------------------------------------------------------------------------------- Multi Wound Chart Details Patient Name: Date of Service: [name] [name] 3/55/7322 8:45 A M Medical Record Number: [id number] Patient Account Number: [id number] Date of Birth/Sex: Treating RN: 03/29/1951 (71 y.o. Sue Lush Primary Care Dariela Stoker: Clydie Braun Other Clinician: Referring Montravious Weigelt: Treating  Delno Blaisdell/Extender: Beckey Downing in Treatment: 1 Vital Signs Height(in): 62 Pulse(bpm): 54 Weight(lbs): 162 Blood Pressure(mmHg): 135/68 Body Mass Index(BMI): 29.6 Temperature(F): 97.5 Respiratory Rate(breaths/min): 17 Photos: [1:Left, Anterior Lower Leg] [N/A:N/A N/A] Wound Location: [1:Gradually Appeared] [N/A:N/A] Wounding Event: [1:Venous Leg Ulcer] [N/A:N/A] Primary Etiology: [1:Arrhythmia, Osteoarthritis] [N/A:N/A] Comorbid History: [1:12/04/2020] [N/A:N/A] Date Acquired: [1:1] [N/A:N/A] Weeks of Treatment: [1:Open] [N/A:N/A] Wound Status: [1:No] [N/A:N/A] Wound Recurrence: [1:1.7x1.4x0.1] [N/A:N/A] Measurements L x W x D (cm) [1:1.869] [N/A:N/A] A (cm) : rea [1:0.187] [N/A:N/A] Volume (cm) : [1:31.00%] [N/A:N/A] % Reduction in A rea: [1:31.00%] [N/A:N/A] % Reduction in Volume: [1:Full Thickness With Exposed  Support N/A] Classification: [1:Structures Medium] [N/A:N/A] Exudate A mount: [1:Purulent] [N/A:N/A] Exudate Type: [1:yellow, brown, green] [N/A:N/A] Exudate Color: [1:Distinct, outline attached] [N/A:N/A] Wound Margin: [1:Medium (34-66%)] [N/A:N/A] Granulation A mount: [1:Red, Pink] [N/A:N/A] Granulation Quality: [1:Medium (34-66%)] [N/A:N/A] Necrotic A mount: [1:Fat Layer (Subcutaneous Tissue): Yes N/A] Exposed Structures: [1:Fascia: No Tendon: No Muscle: No Joint: No Bone: No Small (1-33%)] [N/A:N/A] Epithelialization: [1:Debridement - Excisional] [N/A:N/A] Debridement: Pre-procedure Verification/Time Out 09:21 [N/A:N/A] Taken: [1:Lidocaine] [N/A:N/A] Pain Control: [1:Subcutaneous, Slough] [N/A:N/A] Tissue Debrided: [1:Skin/Subcutaneous Tissue] [N/A:N/A] Level: [1:2.38] [N/A:N/A] Debridement A (sq cm): [1:rea Curette] [N/A:N/A] Instrument: [1:Minimum] [N/A:N/A] Bleeding: [1:Silver Nitrate] [N/A:N/A] Hemostasis A chieved: [1:0] [N/A:N/A] Procedural Pain: [1:0] [N/A:N/A] Post Procedural Pain: [1:Procedure was tolerated well]  [N/A:N/A] Debridement Treatment Response: [1:1.7x1.4x0.1] [N/A:N/A] Post Debridement Measurements L x W x D (cm) [1:0.187] [N/A:N/A] Post Debridement Volume: (cm) [1:Debridement] [N/A:N/A] Treatment Notes Wound #1 (Lower Leg) Wound Laterality: Left, Anterior Cleanser Soap and Water Discharge Instruction: May shower and wash wound with dial antibacterial soap and water prior to dressing change. Wound Cleanser Discharge Instruction: Cleanse the wound with wound cleanser prior to applying a clean dressing using gauze sponges, not tissue or cotton balls. Peri-Wound Care Topical Gentamicin Discharge Instruction: As directed by physician Primary Dressing Hydrofera Blue Ready Foam, 2.5 x2.5 in Discharge Instruction: Apply to wound bed as instructed Secondary Dressing Woven Gauze Sponge, Non-Sterile 4x4 in Discharge Instruction: Apply over primary dressing as directed. Secured With Compression Wrap ThreePress (3 layer compression wrap) Discharge Instruction: Apply three layer compression as directed. Compression Stockings Add-Ons Electronic Signature(s) Signed: 06/25/2021 9:32:49 AM By: Kalman Shan DO Signed: 07/05/2021 5:00:00 PM By: Fara Chute By: Kalman Shan on 06/25/2021 09:24:59 -------------------------------------------------------------------------------- Multi-Disciplinary Care Plan Details Patient Name: Date of Service: [name] [name] 9/47/6546 8:45 A M Medical Record Number: [id number] Patient Account Number: [id number] Date of Birth/Sex: Treating RN: 04-Dec-1950 (71 y.o. Tonita Phoenix, Lauren Primary Care Hatice Bubel: Clydie Braun Other Clinician: Referring Kataya Guimont: Treating Stoney Karczewski/Extender: Beckey Downing in Treatment: 1 Active Inactive Pain, Acute or Chronic Nursing Diagnoses: Pain, acute or chronic: actual or potential Goals: Patient/caregiver will verbalize comfort level met Date Initiated:  06/18/2021 Target Resolution Date: 07/16/2021 Goal Status: Active Interventions: Complete pain assessment as per visit requirements Notes: Venous Leg Ulcer Nursing Diagnoses: Potential for venous Insuffiency (use before diagnosis confirmed) Goals: Patient/caregiver will verbalize understanding of disease process and disease management Date Initiated: 06/18/2021 Target Resolution Date: 07/16/2021 Goal Status: Active Interventions: Provide education on venous insufficiency Notes: Electronic Signature(s) Signed: 06/25/2021 5:21:26 PM By: Rhae Hammock RN Entered By: Rhae Hammock on 06/25/2021 09:20:20 -------------------------------------------------------------------------------- Pain Assessment Details Patient Name: Date of Service: [name] [name] 06/01/5168 8:45 A M Medical Record Number: [id number] Patient Account Number: [id number] Date of Birth/Sex: Treating RN: Dec 14, 1950 (71 y.o. Tonita Phoenix, Lauren Primary Care Jamaine Quintin: Clydie Braun Other Clinician: Referring Lance Galas: Treating Janit Cutter/Extender: Beckey Downing in Treatment: 1 Active Problems Location of Pain Severity and Description of Pain Patient Has Paino No Site Locations Pain Management and Medication Current Pain Management: Electronic Signature(s) Signed: 06/25/2021 5:21:26 PM By: Rhae Hammock RN Entered By: Rhae Hammock on 06/25/2021 09:04:24 -------------------------------------------------------------------------------- Patient/Caregiver Education Details Patient Name: Date of Service: [place] [name] 7/59/1638GYKZLDJ5:70 A M Medical Record Number: [id number] Patient Account Number: [id number] Date of Birth/Gender: Treating RN: 1950-11-20 (71 y.o. Tonita Phoenix, Lauren Primary Care Physician: Clydie Braun Other Clinician: Referring Physician: Treating Physician/Extender: Beckey Downing in Treatment:  1 Education Assessment Education Provided To:  Patient Education Topics Provided Venous: Methods: Explain/Verbal Responses: Reinforcements needed, State content correctly Wound/Skin Impairment: Methods: Explain/Verbal Responses: Reinforcements needed, State content correctly Electronic Signature(s) Signed: 06/25/2021 5:21:26 PM By: Rhae Hammock RN Entered By: Rhae Hammock on 06/25/2021 09:20:41 -------------------------------------------------------------------------------- Wound Assessment Details Patient Name: Date of Service: [name] [name] 0/02/2724 8:45 A M Medical Record Number: [id number] Patient Account Number: [id number] Date of Birth/Sex: Treating RN: 29-Jun-1950 (71 y.o. Tonita Phoenix, Lauren Primary Care Laporchia Nakajima: Clydie Braun Other Clinician: Referring Tristram Milian: Treating Callia Swim/Extender: Artis Delay Weeks in Treatment: 1 Wound Status Wound Number: 1 Primary Etiology: Venous Leg Ulcer Wound Location: Left, Anterior Lower Leg Wound Status: Open Wounding Event: Gradually Appeared Comorbid History: Arrhythmia, Osteoarthritis Date Acquired: 12/04/2020 Weeks Of Treatment: 1 Clustered Wound: No Photos Wound Measurements Length: (cm) 1.7 Width: (cm) 1.4 Depth: (cm) 0.1 Area: (cm) 1.869 Volume: (cm) 0.187 % Reduction in Area: 31% % Reduction in Volume: 31% Epithelialization: Small (1-33%) Tunneling: No Undermining: No Wound Description Classification: Full Thickness With Exposed Support Structures Wound Margin: Distinct, outline attached Exudate Amount: Medium Exudate Type: Purulent Exudate Color: yellow, brown, green Foul Odor After Cleansing: No Slough/Fibrino Yes Wound Bed Granulation Amount: Medium (34-66%) Exposed Structure Granulation Quality: Red, Pink Fascia Exposed: No Necrotic Amount: Medium (34-66%) Fat Layer (Subcutaneous Tissue) Exposed: Yes Necrotic Quality: Adherent Slough Tendon Exposed:  No Muscle Exposed: No Joint Exposed: No Bone Exposed: No Electronic Signature(s) Signed: 06/25/2021 5:21:26 PM By: Rhae Hammock RN Entered By: Rhae Hammock on 06/25/2021 09:15:15 -------------------------------------------------------------------------------- Vitals Details Patient Name: Date of Service: [name] [name] 08/28/9561 8:45 A M Medical Record Number: [id number] Patient Account Number: [id number] Date of Birth/Sex: Treating RN: 1950/08/01 (71 y.o. Tonita Phoenix, Lauren Primary Care Sayuri Rhames: Clydie Braun Other Clinician: Referring Cliffie Gingras: Treating Clearence Vitug/Extender: Beckey Downing in Treatment: 1 Vital Signs Time Taken: 09:03 Temperature (F): 97.5 Height (in): 62 Pulse (bpm): 69 Weight (lbs): 162 Respiratory Rate (breaths/min): 17 Body Mass Index (BMI): 29.6 Blood Pressure (mmHg): 135/68 Reference Range: 80 - 120 mg / dl Electronic Signature(s) Signed: 06/25/2021 5:21:26 PM By: Rhae Hammock RN Entered By: Rhae Hammock on 06/25/2021 09:04:18

## 2021-07-05 NOTE — Progress Notes (Signed)
Molly EdouardCALLICUTT Byron C. ([id number]) , Visit Report for 06/25/2021 Chief Complaint Document Details Patient Name: Date of Service: Molly Hogan, Molly [name] 06/25/2021 8:45 A M Medical Record Number: [id number] Patient Account Number: [id number] Date of Birth/Sex: Treating RN: March 30, 1951 (71 y.o. Molly Hogan) Barnhart, Jodi Primary Care Provider: [name], Meredith Other Clinician: Referring Provider: Treating Provider/Extender: Franchot HeidelbergHoffman, Shawnda Mauney Mitchell, Meredith Weeks in Treatment: 1 Information Obtained from: Patient Chief Complaint Left lower extremity wound Electronic Signature(s) Signed: 06/25/2021 9:32:49 AM By: [name], Trenna Kiely DO Entered By: [name], Rickiya Picariello on 06/25/2021 09:25:12 -------------------------------------------------------------------------------- Debridement Details Patient Name: Date of Service: Molly [name], Molly [name] 06/25/2021 8:45 A M Medical Record Number: [id number] Patient Account Number: [id number] Date of Birth/Sex: Treating RN: March 30, 1951 (71 y.o. Molly Hogan) Breedlove, Lauren Primary Care Provider: [name], Meredith Other Clinician: Referring Provider: Treating Provider/Extender: Franchot HeidelbergHoffman, Aurther Harlin Mitchell, Meredith Weeks in Treatment: 1 Debridement Performed for Assessment: Wound #1 Left,Anterior Lower Leg Performed By: Physician [name], Basir Niven, DO Debridement Type: Debridement Severity of Tissue Pre Debridement: Fat layer exposed Level of Consciousness (Pre-procedure): Awake and Alert Pre-procedure Verification/Time Out Yes - 09:21 Taken: Start Time: 09:21 Pain Control: Lidocaine T Area Debrided (L x W): otal 1.7 (cm) x 1.4 (cm) = 2.38 (cm) Tissue and other material debrided: Viable, Non-Viable, Slough, Subcutaneous, Slough Level: Skin/Subcutaneous Tissue Debridement Description: Excisional Instrument: Curette Bleeding: Minimum Hemostasis Achieved: Silver Nitrate End Time: 09:21 Procedural Pain: 0 Post Procedural Pain: 0 Response to Treatment: Procedure was  tolerated well Level of Consciousness (Post- Awake and Alert procedure): Post Debridement Measurements of Total Wound Length: (cm) 1.7 Width: (cm) 1.4 Depth: (cm) 0.1 Volume: (cm) 0.187 Character of Wound/Ulcer Post Debridement: Improved Severity of Tissue Post Debridement: Fat layer exposed Post Procedure Diagnosis Same as Pre-procedure Electronic Signature(s) Signed: 06/25/2021 9:32:49 AM By: [name], Omer Puccinelli DO Signed: 06/25/2021 5:21:26 PM By: Fonnie MuBreedlove, Lauren RN Entered By: Fonnie MuBreedlove, Lauren on 06/25/2021 09:22:07 -------------------------------------------------------------------------------- HPI Details Patient Name: Date of Service: Molly [name], Molly [name] 06/25/2021 8:45 A M Medical Record Number: [id number] Patient Account Number: [id number] Date of Birth/Sex: Treating RN: March 30, 1951 (71 y.o. Molly Hogan) Barnhart, Jodi Primary Care Provider: [name], Meredith Other Clinician: Referring Provider: Treating Provider/Extender: Franchot HeidelbergHoffman, Chelsee Hosie Mitchell, Meredith Weeks in Treatment: 1 History of Present Illness HPI Description: Admission 06/18/2021 Molly Hogan is a 71 year old female with a past medical history of chronic venous insufficiency that presents to the clinic for a 2873-month history of nonhealing wound to her left lower extremity. She states this happened when she had a dishwasher door. She has [facility]. She started with calcium alginate and Adaptic and now is currently using Medihoney. She reports tenderness to the wound site. She denies systemic signs of infection. She has compression stockings but does not wear these daily. 2/20; patient presents for follow-up. She has no issues or complaints today. She denies signs of infection. She tolerated the compression wrap well. Electronic Signature(s) Signed: 06/25/2021 9:32:49 AM By: [name], Lamiracle Chaidez DO Entered By: [name], Hilmer Aliberti on 06/25/2021  09:25:34 -------------------------------------------------------------------------------- Physical Exam Details Patient Name: Date of Service: Molly [name], Molly [name] 06/25/2021 8:45 A M Medical Record Number: [id number] Patient Account Number: [id number] Date of Birth/Sex: Treating RN: March 30, 1951 (71 y.o. Molly Hogan) Barnhart, Jodi Primary Care Provider: [name], Meredith Other Clinician: Referring Provider: Treating Provider/Extender: Cordie GriceHoffman, Kairah Leoni Mitchell, Meredith Weeks in Treatment: 1 Constitutional respirations regular, non-labored and within target range for patient.. Cardiovascular 2+ dorsalis pedis/posterior tibialis pulses. Psychiatric pleasant and cooperative. Notes Left lower extremity: T the anterior  aspect there is an open wound with nonviable tissue, granulation tissue and hypergranulation noted. No surrounding signs o of infection. Venous stasis dermatitis and varicose veins noted. 2+ pitting edema to the knee Electronic Signature(s) Signed: 06/25/2021 9:32:49 AM By: Geralyn Corwin DO Entered By: Geralyn Corwin on 06/25/2021 09:31:43 -------------------------------------------------------------------------------- Physician Orders Details Patient Name: Date of Service: Molly Devonne Doughty [name] 06/25/2021 8:45 A M Medical Record Number: [id number] Patient Account Number: [id number] Date of Birth/Sex: Treating RN: 1951-04-06 (71 y.o. Molly Rowan, Lauren Primary Care Provider: Maurie Boettcher Other Clinician: Referring Provider: Treating Provider/Extender: Franchot Heidelberg in Treatment: 1 Verbal / Phone Orders: No Diagnosis Coding Follow-up Appointments ppointment in 1 week. - Dr Mikey Bussing w/ Maryruth Bun in room # 6 Return A Bathing/ Shower/ Hygiene May shower with protection but do not get wound dressing(s) wet. - *** If showering, please use leg protector*** These can be purchased from local pharmacies such as [facility] or [facility] Edema Control  - Lymphedema / SCD / Other Bilateral Lower Extremities Elevate legs to the level of the heart or above for 30 minutes daily and/or when sitting, a frequency of: Avoid standing for long periods of time. Patient to wear own compression stockings every day. - Right leg Exercise regularly Wound Treatment Wound #1 - Lower Leg Wound Laterality: Left, Anterior Cleanser: Soap and Water Discharge Instructions: May shower and wash wound with dial antibacterial soap and water prior to dressing change. Cleanser: Wound Cleanser Discharge Instructions: Cleanse the wound with wound cleanser prior to applying a clean dressing using gauze sponges, not tissue or cotton balls. Topical: Gentamicin Discharge Instructions: As directed by physician Prim Dressing: Hydrofera Blue Ready Foam, 2.5 x2.5 in ary Discharge Instructions: Apply to wound bed as instructed Secondary Dressing: Woven Gauze Sponge, Non-Sterile 4x4 in Discharge Instructions: Apply over primary dressing as directed. Compression Wrap: ThreePress (3 layer compression wrap) Discharge Instructions: Apply three layer compression as directed. Electronic Signature(s) Signed: 06/25/2021 9:32:49 AM By: Geralyn Corwin DO Entered By: Geralyn Corwin on 06/25/2021 09:32:00 -------------------------------------------------------------------------------- Problem List Details Patient Name: Date of Service: Molly Devonne Doughty [name] 06/25/2021 8:45 A M Medical Record Number: [id number] Patient Account Number: [id number] Date of Birth/Sex: Treating RN: [birth year] (71 y.o. Molly Cluck Primary Care Provider: Maurie Boettcher Other Clinician: Referring Provider: Treating Provider/Extender: Franchot Heidelberg in Treatment: 1 Active Problems ICD-10 Encounter Code Description Active Date MDM Diagnosis I87.312 Chronic venous hypertension (idiopathic) with ulcer of left lower extremity 06/18/2021 No Yes L97.822 Non-pressure  chronic ulcer of other part of left lower leg with fat layer exposed2/13/2023 No Yes Inactive Problems Resolved Problems Electronic Signature(s) Signed: 06/25/2021 9:32:49 AM By: Geralyn Corwin DO Entered By: Geralyn Corwin on 06/25/2021 09:24:43 -------------------------------------------------------------------------------- Progress Note Details Patient Name: Date of Service: Molly Devonne Doughty [name] 06/25/2021 8:45 A M Medical Record Number: [id number] Patient Account Number: [id number] Date of Birth/Sex: Treating RN: Jan 18, 1951 (71 y.o. Molly Cluck Primary Care Provider: Maurie Boettcher Other Clinician: Referring Provider: Treating Provider/Extender: Franchot Heidelberg in Treatment: 1 Subjective Chief Complaint Information obtained from Patient Left lower extremity wound History of Present Illness (HPI) Admission 06/18/2021 Ms. Bryauna Byrum is a 71 year old female with a past medical history of chronic venous insufficiency that presents to the clinic for a 68-month history of nonhealing wound to her left lower extremity. She states this happened when she had a dishwasher door. She has been following with Dr. Lequita Halt at [facility] for this issue. She started with calcium  alginate and Adaptic and now is currently using Medihoney. She reports tenderness to the wound site. She denies systemic signs of infection. She has compression stockings but does not wear these daily. 2/20; patient presents for follow-up. She has no issues or complaints today. She denies signs of infection. She tolerated the compression wrap well. Patient History Information obtained from Patient. Family History Unknown History. Social History Never smoker, Marital Status - Married, Alcohol Use - Never, Drug Use - No History, Caffeine Use - Moderate - T or coffee. ea Medical History Cardiovascular Patient has history of Arrhythmia - Hx: SVT Musculoskeletal Patient has  history of Osteoarthritis - Right Knee Hospitalization/Surgery History - broken Ankle;Gastric Volvulus;T meniscus R + L knees;Broken left pelvis;Kidney stones. orn Medical A Surgical History Notes nd Ear/Nose/Mouth/Throat Esophagitis Cardiovascular Pt. has had Bradycardia Gastrointestinal Hx: Barrett Esophagus Genitourinary Renal cyst: Calculus of Kidney Musculoskeletal Also had Hx of Left Medial meniscus tear Objective Constitutional respirations regular, non-labored and within target range for patient.. Vitals Time Taken: 9:03 AM, Height: 62 in, Weight: 162 lbs, BMI: 29.6, Temperature: 97.5 F, Pulse: 69 bpm, Respiratory Rate: 17 breaths/min, Blood Pressure: 135/68 mmHg. Cardiovascular 2+ dorsalis pedis/posterior tibialis pulses. Psychiatric pleasant and cooperative. General Notes: Left lower extremity: T the anterior aspect there is an open wound with nonviable tissue, granulation tissue and hypergranulation noted. No o surrounding signs of infection. Venous stasis dermatitis and varicose veins noted. 2+ pitting edema to the knee Integumentary (Hair, Skin) Wound #1 status is Open. Original cause of wound was Gradually Appeared. The date acquired was: 12/04/2020. The wound has been in treatment 1 weeks. The wound is located on the Left,Anterior Lower Leg. The wound measures 1.7cm length x 1.4cm width x 0.1cm depth; 1.869cm^2 area and 0.187cm^3 volume. There is Fat Layer (Subcutaneous Tissue) exposed. There is no tunneling or undermining noted. There is a medium amount of purulent drainage noted. The wound margin is distinct with the outline attached to the wound base. There is medium (34-66%) red, pink granulation within the wound bed. There is a medium (34-66%) amount of necrotic tissue within the wound bed including Adherent Slough. Assessment Active Problems ICD-10 Chronic venous hypertension (idiopathic) with ulcer of left lower extremity Non-pressure chronic ulcer of other  part of left lower leg with fat layer exposed Patient's wound has shown improvement in size and appearance since last clinic visit. She is doing well with Hydrofera Blue and gentamicin under Kerlix/Coban. At this time she would benefit from higher compression and we will go up to a 3 layer. I debrided nonviable tissue. No surrounding signs of infection. I used silver nitrate to the hyper granulated areas. I recommended continuing with Hydrofera Blue and gentamicin. Procedures Wound #1 Pre-procedure diagnosis of Wound #1 is a Venous Leg Ulcer located on the Left,Anterior Lower Leg .Severity of Tissue Pre Debridement is: Fat layer exposed. There was a Excisional Skin/Subcutaneous Tissue Debridement with a total area of 2.38 sq cm performed by Geralyn Corwin, DO. With the following instrument(s): Curette to remove Viable and Non-Viable tissue/material. Material removed includes Subcutaneous Tissue and Slough and after achieving pain control using Lidocaine. No specimens were taken. A time out was conducted at 09:21, prior to the start of the procedure. A Minimum amount of bleeding was controlled with Silver Nitrate. The procedure was tolerated well with a pain level of 0 throughout and a pain level of 0 following the procedure. Post Debridement Measurements: 1.7cm length x 1.4cm width x 0.1cm depth; 0.187cm^3 volume. Character of Wound/Ulcer Post  Debridement is improved. Severity of Tissue Post Debridement is: Fat layer exposed. Post procedure Diagnosis Wound #1: Same as Pre-Procedure Plan Follow-up Appointments: Return Appointment in 1 week. - Dr Mikey Bussing w/ Maryruth Bun in room # 6 Bathing/ Shower/ Hygiene: May shower with protection but do not get wound dressing(s) wet. - *** If showering, please use leg protector*** These can be purchased from local pharmacies such as [facility] or [facility] Edema Control - Lymphedema / SCD / Other: Elevate legs to the level of the heart or above for 30 minutes daily  and/or when sitting, a frequency of: Avoid standing for long periods of time. Patient to wear own compression stockings every day. - Right leg Exercise regularly WOUND #1: - Lower Leg Wound Laterality: Left, Anterior Cleanser: Soap and Water Discharge Instructions: May shower and wash wound with dial antibacterial soap and water prior to dressing change. Cleanser: Wound Cleanser Discharge Instructions: Cleanse the wound with wound cleanser prior to applying a clean dressing using gauze sponges, not tissue or cotton balls. Topical: Gentamicin Discharge Instructions: As directed by physician Prim Dressing: Hydrofera Blue Ready Foam, 2.5 x2.5 in ary Discharge Instructions: Apply to wound bed as instructed Secondary Dressing: Woven Gauze Sponge, Non-Sterile 4x4 in Discharge Instructions: Apply over primary dressing as directed. Com pression Wrap: ThreePress (3 layer compression wrap) Discharge Instructions: Apply three layer compression as directed. 1. Hydrofera Blue and gentamicin under 3 layer compression 2. In office sharp debridement 3. Silver nitrate 4. Follow-up in 1 week Electronic Signature(s) Signed: 06/25/2021 9:32:49 AM By: Geralyn Corwin DO Entered By: Geralyn Corwin on 06/25/2021 09:32:17 -------------------------------------------------------------------------------- HxROS Details Patient Name: Date of Service: Molly Devonne Doughty [name] 06/25/2021 8:45 A M Medical Record Number: [id number] Patient Account Number: [id number] Date of Birth/Sex: Treating RN: Feb 18, 1951 (71 y.o. Molly Cluck Primary Care Provider: Maurie Boettcher Other Clinician: Referring Provider: Treating Provider/Extender: Franchot Heidelberg in Treatment: 1 Information Obtained From Patient Ear/Nose/Mouth/Throat Medical History: Past Medical History Notes: Esophagitis Cardiovascular Medical History: Positive for: Arrhythmia - Hx: SVT Past Medical History  Notes: Pt. has had Bradycardia Gastrointestinal Medical History: Past Medical History Notes: Hx: Barrett Esophagus Genitourinary Medical History: Past Medical History Notes: Renal cyst: Calculus of Kidney Musculoskeletal Medical History: Positive for: Osteoarthritis - Right Knee Past Medical History Notes: Also had Hx of Left Medial meniscus tear Immunizations Pneumococcal Vaccine: Received Pneumococcal Vaccination: Yes Received Pneumococcal Vaccination On or After 60th Birthday: Yes Implantable Devices No devices added Hospitalization / Surgery History Type of Hospitalization/Surgery broken Ankle;Gastric Volvulus;T meniscus R + L knees;Broken left pelvis;Kidney stones orn Family and Social History Unknown History: Yes; Never smoker; Marital Status - Married; Alcohol Use: Never; Drug Use: No History; Caffeine Use: Moderate - T or coffee; Financial ea Concerns: No; Food, Clothing or Shelter Needs: No; Support System Lacking: No; Transportation Concerns: No Electronic Signature(s) Signed: 06/25/2021 9:32:49 AM By: Geralyn Corwin DO Signed: 07/05/2021 5:00:00 PM By: Antonieta Iba Entered By: Geralyn Corwin on 06/25/2021 09:25:43 -------------------------------------------------------------------------------- SuperBill Details Patient Name: Date of Service: Molly [name] 06/25/2021 Medical Record Number: [id number] Patient Account Number: [id number] Date of Birth/Sex: Treating RN: Sep 11, 1950 (71 y.o. Molly Rowan, Lauren Primary Care Provider: Maurie Boettcher Other Clinician: Referring Provider: Treating Provider/Extender: Franchot Heidelberg in Treatment: 1 Diagnosis Coding ICD-10 Codes Code Description [phone] Chronic venous hypertension (idiopathic) with ulcer of left lower extremity L97.822 Non-pressure chronic ulcer of other part of left lower leg with fat layer exposed Facility Procedures CPT4 Code: [id number] Description: [phone]  -  DEB SUBQ TISSUE 20 SQ CM/< ICD-10 Diagnosis Description L97.822 Non-pressure chronic ulcer of other part of left lower leg with fat layer expo Modifier: sed Quantity: 1 Physician Procedures : CPT4 Code Description Modifier [id number] - WC PHYS SUBQ TISS 20 SQ CM ICD-10 Diagnosis Description L97.822 Non-pressure chronic ulcer of other part of left lower leg with fat layer exposed Quantity: 1 Electronic Signature(s) Signed: 06/25/2021 9:32:49 AM By: Geralyn Corwin DO Entered By: Geralyn Corwin on 06/25/2021 09:32:29

## 2021-07-10 ENCOUNTER — Other Ambulatory Visit: Payer: Self-pay

## 2021-07-10 ENCOUNTER — Encounter (HOSPITAL_BASED_OUTPATIENT_CLINIC_OR_DEPARTMENT_OTHER): Payer: Medicare Other | Attending: Internal Medicine | Admitting: Internal Medicine

## 2021-07-10 DIAGNOSIS — I87312 Chronic venous hypertension (idiopathic) with ulcer of left lower extremity: Secondary | ICD-10-CM | POA: Insufficient documentation

## 2021-07-10 DIAGNOSIS — L97822 Non-pressure chronic ulcer of other part of left lower leg with fat layer exposed: Secondary | ICD-10-CM | POA: Insufficient documentation

## 2021-07-10 NOTE — Progress Notes (Signed)
Molly EdouardCALLICUTT Molly C. ([id number]) , Visit Report for 07/10/2021 Chief Complaint Document Details Patient Name: Date of Service: Molly Hogan, Molly Molly C. 07/10/2021 9:30 A M Medical Record Number: [id number] Patient Account Number: [id number] Date of Birth/Sex: Treating RN: 1951/02/18 (71 y.o. F) Primary Care Provider: [name], Meredith Other Clinician: Referring Provider: Treating Provider/Extender: Franchot HeidelbergHoffman, Jarek Longton Mitchell, Meredith Weeks in Treatment: 3 Information Obtained from: Patient Chief Complaint Left lower extremity wound Electronic Signature(s) Signed: 07/10/2021 12:34:06 PM By: [name], Ina Scrivens DO Entered By: [name], Elias Dennington on 07/10/2021 10:59:50 -------------------------------------------------------------------------------- Debridement Details Patient Name: Date of Service: Molly Hogan, Molly Molly C. 07/10/2021 9:30 A M Medical Record Number: [id number] Patient Account Number: [id number] Date of Birth/Sex: Treating RN: 1951/02/18 (71 y.o. [name]) Barnhart, Jodi Primary Care Provider: [name], Meredith Other Clinician: Referring Provider: Treating Provider/Extender: Franchot HeidelbergHoffman, Dayanna Pryce Mitchell, Meredith Weeks in Treatment: 3 Debridement Performed for Assessment: Wound #1 Left,Anterior Lower Leg Performed By: Physician [name], Rozann Holts, DO Debridement Type: Debridement Severity of Tissue Pre Debridement: Fat layer exposed Level of Consciousness (Pre-procedure): Awake and Alert Pre-procedure Verification/Time Out Yes - 10:24 Taken: Start Time: 10:25 Pain Control: Other : Benzocaine T Area Debrided (L x W): otal 2 (cm) x 1.4 (cm) = 2.8 (cm) Tissue and other material debrided: Non-Viable, Slough, Subcutaneous, Slough, Hyper-granulation Level: Skin/Subcutaneous Tissue Debridement Description: Excisional Instrument: Curette, Other : Silver Nitrate Bleeding: Minimum Hemostasis Achieved: Pressure End Time: 10:30 Response to Treatment: Procedure was tolerated well Level of  Consciousness (Post- Awake and Alert procedure): Post Debridement Measurements of Total Wound Length: (cm) 2 Width: (cm) 1.4 Depth: (cm) 0.1 Volume: (cm) 0.22 Character of Wound/Ulcer Post Debridement: Stable Severity of Tissue Post Debridement: Fat layer exposed Post Procedure Diagnosis Same as Pre-procedure Electronic Signature(s) Signed: 07/10/2021 12:34:06 PM By: [name], Gearald Stonebraker DO Signed: 07/10/2021 5:17:32 PM By: Antonieta IbaBarnhart, Jodi Entered By: Antonieta IbaBarnhart, Jodi on 07/10/2021 10:33:00 -------------------------------------------------------------------------------- HPI Details Patient Name: Date of Service: Molly Hogan, Molly Molly C. 07/10/2021 9:30 A M Medical Record Number: [id number] Patient Account Number: [id number] Date of Birth/Sex: Treating RN: 1951/02/18 (71 y.o. F) Primary Care Provider: [name], Meredith Other Clinician: Referring Provider: Treating Provider/Extender: Franchot HeidelbergHoffman, Kaniah Rizzolo Mitchell, Meredith Weeks in Treatment: 3 History of Present Illness HPI Description: Admission 06/18/2021 Ms. Molly Hogan is a 71 year old female with a past medical history of chronic venous insufficiency that presents to the clinic for a 564-month history of nonhealing wound to her left lower extremity. She states this happened when she had a dishwasher door. She [facility]. She started with calcium alginate and Adaptic and now is currently using Medihoney. She reports tenderness to the wound site. She denies systemic signs of infection. She has compression stockings but does not wear these daily. 2/20; patient presents for follow-up. She has no issues or complaints today. She denies signs of infection. She tolerated the compression wrap well. 2/27; patient presents for follow-up. She tolerated the 3 layer compression wrap well. She has no issues or complaints today. 3/7; patient presents for follow-up. She has no issues or complaints  today. Electronic Signature(s) Signed: 07/10/2021 12:34:06 PM By: [name], Kerby Borner DO Entered By: [name], Jakari Sada on 07/10/2021 11:00:10 -------------------------------------------------------------------------------- Physical Exam Details Patient Name: Date of Service: Molly Hogan, Molly Molly C. 07/10/2021 9:30 A M Medical Record Number: [id number] Patient Account Number: [id number] Date of Birth/Sex: Treating RN: 1951/02/18 (71 y.o. F) Primary Care Provider: [name], Meredith Other Clinician: Referring Provider: Treating Provider/Extender: Franchot HeidelbergHoffman, Esme Freund Mitchell, Meredith Weeks in Treatment: 3 Constitutional respirations regular, non-labored  and within target range for patient.. Cardiovascular 2+ dorsalis pedis/posterior tibialis pulses. Psychiatric pleasant and cooperative. Notes Left lower extremity: T the anterior aspect there is an open wound with nonviable tissue, granulation tissue and hypergranulation noted. No surrounding signs o of infection. Venous stasis dermatitis and varicose veins noted. Good edema control. Electronic Signature(s) Signed: 07/10/2021 12:34:06 PM By: Geralyn Corwin DO Entered By: Geralyn Corwin on 07/10/2021 11:00:46 -------------------------------------------------------------------------------- Physician Orders Details Patient Name: Date of Service: Molly Hogan Molly C. 07/10/2021 9:30 A M Medical Record Number: [id number] Patient Account Number: [id number] Date of Birth/Sex: Treating RN: 02-02-1951 (71 y.o. Roel Cluck Primary Care Provider: Maurie Boettcher Other Clinician: Referring Provider: Treating Provider/Extender: Franchot Heidelberg in Treatment: 3 Verbal / Phone Orders: No Diagnosis Coding ICD-10 Coding Code Description [phone] Chronic venous hypertension (idiopathic) with ulcer of left lower extremity L97.822 Non-pressure chronic ulcer of other part of left lower leg with fat layer  exposed Follow-up Appointments ppointment in 2 weeks. - Dr Mikey Bussing w/ [facility], Room 7 Return A Nurse Visit: - For wrap change Bathing/ Shower/ Hygiene May shower with protection but do not get wound dressing(s) wet. - *** If showering, please use cast protector*** These can be purchased from local pharmacies such as [facility] or [facility] Edema Control - Lymphedema / SCD / Other Bilateral Lower Extremities Elevate legs to the level of the heart or above for 30 minutes daily and/or when sitting, a frequency of: Avoid standing for long periods of time. Patient to wear own compression stockings every day. - Right leg Exercise regularly Wound Treatment Wound #1 - Lower Leg Wound Laterality: Left, Anterior Cleanser: Soap and Water 1 x Per Week Discharge Instructions: May shower and wash wound with dial antibacterial soap and water prior to dressing change. Cleanser: Wound Cleanser 1 x Per Week Discharge Instructions: Cleanse the wound with wound cleanser prior to applying a clean dressing using gauze sponges, not tissue or cotton balls. Prim Dressing: Hydrofera Blue Ready Foam, 2.5 x2.5 in 1 x Per Week ary Discharge Instructions: Apply to wound bed as instructed Secondary Dressing: Woven Gauze Sponge, Non-Sterile 4x4 in 1 x Per Week Discharge Instructions: Apply over primary dressing as directed. Compression Wrap: ThreePress (3 layer compression wrap) 1 x Per Week Discharge Instructions: Apply three layer compression as directed. Electronic Signature(s) Signed: 07/10/2021 12:34:06 PM By: Geralyn Corwin DO Entered By: Geralyn Corwin on 07/10/2021 11:01:16 -------------------------------------------------------------------------------- Problem List Details Patient Name: Date of Service: Molly Hogan Molly C. 07/10/2021 9:30 A M Medical Record Number: [id number] Patient Account Number: [id number] Date of Birth/Sex: Treating RN: 1951/01/13 (71 y.o. Roel Cluck Primary Care Provider:  Maurie Boettcher Other Clinician: Referring Provider: Treating Provider/Extender: Franchot Heidelberg in Treatment: 3 Active Problems ICD-10 Encounter Code Description Active Date MDM Diagnosis I87.312 Chronic venous hypertension (idiopathic) with ulcer of left lower extremity 06/18/2021 No Yes L97.822 Non-pressure chronic ulcer of other part of left lower leg with fat layer exposed2/13/2023 No Yes Inactive Problems Resolved Problems Electronic Signature(s) Signed: 07/10/2021 12:34:06 PM By: Geralyn Corwin DO Previous Signature: 07/10/2021 10:05:47 AM Version By: Antonieta Iba Entered By: Geralyn Corwin on 07/10/2021 10:59:36 -------------------------------------------------------------------------------- Progress Note Details Patient Name: Date of Service: Molly Hogan Molly C. 07/10/2021 9:30 A M Medical Record Number: [id number] Patient Account Number: [id number] Date of Birth/Sex: Treating RN: 06-05-1950 (71 y.o. F) Primary Care Provider: Maurie Boettcher Other Clinician: Referring Provider: Treating Provider/Extender: Franchot Heidelberg in Treatment: 3 Subjective Chief Complaint Information obtained  from Patient Left lower extremity wound History of Present Illness (HPI) Admission 06/18/2021 Ms. [place] Courtemanche is a 71 year old female with a past medical history of chronic venous insufficiency that presents to the clinic for a 44-month history of nonhealing wound to her left lower extremity. She states this happened when she had a dishwasher door. She has been following with Dr. Lequita Halt at [facility] for this issue. She started with calcium alginate and Adaptic and now is currently using Medihoney. She reports tenderness to the wound site. She denies systemic signs of infection. She has compression stockings but does not wear these daily. 2/20; patient presents for follow-up. She has no issues or complaints today. She  denies signs of infection. She tolerated the compression wrap well. 2/27; patient presents for follow-up. She tolerated the 3 layer compression wrap well. She has no issues or complaints today. 3/7; patient presents for follow-up. She has no issues or complaints today. Patient History Information obtained from Patient. Family History Unknown History. Social History Never smoker, Marital Status - Married, Alcohol Use - Never, Drug Use - No History, Caffeine Use - Moderate - T or coffee. ea Medical History Cardiovascular Patient has history of Arrhythmia - Hx: SVT Musculoskeletal Patient has history of Osteoarthritis - Right Knee Hospitalization/Surgery History - broken Ankle;Gastric Volvulus;T meniscus R + L knees;Broken left pelvis;Kidney stones. orn Medical A Surgical History Notes nd Ear/Nose/Mouth/Throat Esophagitis Cardiovascular Pt. has had Bradycardia Gastrointestinal Hx: Barrett Esophagus Genitourinary Renal cyst: Calculus of Kidney Musculoskeletal Also had Hx of Left Medial meniscus tear Objective Constitutional respirations regular, non-labored and within target range for patient.. Vitals Time Taken: 9:52 AM, Height: 62 in, Weight: 162 lbs, BMI: 29.6, Temperature: 97.8 F, Pulse: 71 bpm, Respiratory Rate: 18 breaths/min, Blood Pressure: 137/91 mmHg. Cardiovascular 2+ dorsalis pedis/posterior tibialis pulses. Psychiatric pleasant and cooperative. General Notes: Left lower extremity: T the anterior aspect there is an open wound with nonviable tissue, granulation tissue and hypergranulation noted. No o surrounding signs of infection. Venous stasis dermatitis and varicose veins noted. Good edema control. Integumentary (Hair, Skin) Wound #1 status is Open. Original cause of wound was Gradually Appeared. The date acquired was: 12/04/2020. The wound has been in treatment 3 weeks. The wound is located on the Left,Anterior Lower Leg. The wound measures 2cm length x 1.4cm  width x 0.1cm depth; 2.199cm^2 area and 0.22cm^3 volume. There is Fat Layer (Subcutaneous Tissue) exposed. There is no tunneling or undermining noted. There is a medium amount of serosanguineous drainage noted. The wound margin is distinct with the outline attached to the wound base. There is medium (34-66%) red, pink granulation within the wound bed. There is a medium (34-66%) amount of necrotic tissue within the wound bed including Adherent Slough. Assessment Active Problems ICD-10 Chronic venous hypertension (idiopathic) with ulcer of left lower extremity Non-pressure chronic ulcer of other part of left lower leg with fat layer exposed Patient's wound is stable. I debrided nonviable tissue. No signs of surrounding infection. I used silver nitrate to the hyper granulated areas. I recommended continuing with current therapy of Hydrofera Blue under 3 layer compression. Procedures Wound #1 Pre-procedure diagnosis of Wound #1 is a Venous Leg Ulcer located on the Left,Anterior Lower Leg .Severity of Tissue Pre Debridement is: Fat layer exposed. There was a Excisional Skin/Subcutaneous Tissue Debridement with a total area of 2.8 sq cm performed by Geralyn Corwin, DO. With the following instrument(s): Curette, Silver Nitrate to remove Non-Viable tissue/material. Material removed includes Subcutaneous Tissue, Slough, and Hyper-granulation after achieving pain  control using Other (Benzocaine). No specimens were taken. A time out was conducted at 10:24, prior to the start of the procedure. A Minimum amount of bleeding was controlled with Pressure. The procedure was tolerated well. Post Debridement Measurements: 2cm length x 1.4cm width x 0.1cm depth; 0.22cm^3 volume. Character of Wound/Ulcer Post Debridement is stable. Severity of Tissue Post Debridement is: Fat layer exposed. Post procedure Diagnosis Wound #1: Same as Pre-Procedure Pre-procedure diagnosis of Wound #1 is a Venous Leg Ulcer located on  the Left,Anterior Lower Leg . There was a Three Layer Compression Therapy Procedure by Antonieta Iba, RN. Post procedure Diagnosis Wound #1: Same as Pre-Procedure Plan Follow-up Appointments: Return Appointment in 2 weeks. - Dr Mikey Bussing w/ [facility], Room 7 Nurse Visit: - For wrap change Bathing/ Shower/ Hygiene: May shower with protection but do not get wound dressing(s) wet. - *** If showering, please use cast protector*** These can be purchased from local pharmacies such as [facility] or [facility] Edema Control - Lymphedema / SCD / Other: Elevate legs to the level of the heart or above for 30 minutes daily and/or when sitting, a frequency of: Avoid standing for long periods of time. Patient to wear own compression stockings every day. - Right leg Exercise regularly WOUND #1: - Lower Leg Wound Laterality: Left, Anterior Cleanser: Soap and Water 1 x Per Week/ Discharge Instructions: May shower and wash wound with dial antibacterial soap and water prior to dressing change. Cleanser: Wound Cleanser 1 x Per Week/ Discharge Instructions: Cleanse the wound with wound cleanser prior to applying a clean dressing using gauze sponges, not tissue or cotton balls. Prim Dressing: Hydrofera Blue Ready Foam, 2.5 x2.5 in 1 x Per Week/ ary Discharge Instructions: Apply to wound bed as instructed Secondary Dressing: Woven Gauze Sponge, Non-Sterile 4x4 in 1 x Per Week/ Discharge Instructions: Apply over primary dressing as directed. Com pression Wrap: ThreePress (3 layer compression wrap) 1 x Per Week/ Discharge Instructions: Apply three layer compression as directed. 1. In office sharp debridement 2. Silver nitrate 3. Hydrofera Blue under 3 layer compression 4. Follow-up in 1 week for nurse visit in 2 weeks with me Electronic Signature(s) Signed: 07/10/2021 12:34:06 PM By: Geralyn Corwin DO Entered By: Geralyn Corwin on 07/10/2021  11:06:17 -------------------------------------------------------------------------------- HxROS Details Patient Name: Date of Service: Molly Hogan Molly C. 07/10/2021 9:30 A M Medical Record Number: [id number] Patient Account Number: [id number] Date of Birth/Sex: Treating RN: [birth year] (71 y.o. F) Primary Care Provider: Maurie Boettcher Other Clinician: Referring Provider: Treating Provider/Extender: Franchot Heidelberg in Treatment: 3 Information Obtained From Patient Ear/Nose/Mouth/Throat Medical History: Past Medical History Notes: Esophagitis Cardiovascular Medical History: Positive for: Arrhythmia - Hx: SVT Past Medical History Notes: Pt. has had Bradycardia Gastrointestinal Medical History: Past Medical History Notes: Hx: Barrett Esophagus Genitourinary Medical History: Past Medical History Notes: Renal cyst: Calculus of Kidney Musculoskeletal Medical History: Positive for: Osteoarthritis - Right Knee Past Medical History Notes: Also had Hx of Left Medial meniscus tear Immunizations Pneumococcal Vaccine: Received Pneumococcal Vaccination: Yes Received Pneumococcal Vaccination On or After 60th Birthday: Yes Implantable Devices No devices added Hospitalization / Surgery History Type of Hospitalization/Surgery broken Ankle;Gastric Volvulus;T meniscus R + L knees;Broken left pelvis;Kidney stones orn Family and Social History Unknown History: Yes; Never smoker; Marital Status - Married; Alcohol Use: Never; Drug Use: No History; Caffeine Use: Moderate - T or coffee; Financial ea Concerns: No; Food, Clothing or Shelter Needs: No; Support System Lacking: No; Transportation Concerns: No Electronic Signature(s) Signed: 07/10/2021 12:34:06 PM  By: Geralyn Corwin DO Entered By: Geralyn Corwin on 07/10/2021 11:00:19 -------------------------------------------------------------------------------- SuperBill Details Patient Name: Date of  Service: Molly Hogan Molly C. 07/10/2021 Medical Record Number: [id number] Patient Account Number: [id number] Date of Birth/Sex: Treating RN: Jun 17, 1950 (71 y.o. Roel Cluck Primary Care Provider: Maurie Boettcher Other Clinician: Referring Provider: Treating Provider/Extender: Franchot Heidelberg in Treatment: 3 Diagnosis Coding ICD-10 Codes Code Description [phone] Chronic venous hypertension (idiopathic) with ulcer of left lower extremity L97.822 Non-pressure chronic ulcer of other part of left lower leg with fat layer exposed Facility Procedures CPT4 Code: [id number] Description: 11042 - DEB SUBQ TISSUE 20 SQ CM/< ICD-10 Diagnosis Description L97.822 Non-pressure chronic ulcer of other part of left lower leg with fat layer expo Modifier: sed Quantity: 1 Physician Procedures : CPT4 Code Description Modifier [id number] 11042 - WC PHYS SUBQ TISS 20 SQ CM ICD-10 Diagnosis Description L97.822 Non-pressure chronic ulcer of other part of left lower leg with fat layer exposed Quantity: 1 Electronic Signature(s) Signed: 07/10/2021 12:34:06 PM By: Geralyn Corwin DO Entered By: Geralyn Corwin on 07/10/2021 11:06:28

## 2021-07-10 NOTE — Progress Notes (Signed)
Molly Hogan ([id number]) , Visit Report for 07/10/2021 Arrival Information Details Patient Name: Date of Service: Molly Hogan Q000111Q 9:30 A M Medical Record Number: [id number] Patient Account Number: [id number] Date of Birth/Sex: Treating RN: [birth year] (71 y.o. F) Primary Care Dartanian Knaggs: Clydie Braun Other Clinician: Referring Daymond Cordts: Treating Tekila Caillouet/Extender: Beckey Downing in Treatment: 3 Visit Information History Since Last Visit Added or deleted any medications: No Patient Arrived: Ambulatory Any new allergies or adverse reactions: No Arrival Time: 09:50 Had a fall or experienced change in No Accompanied By: self activities of daily living that may affect Transfer Assistance: None risk of falls: Patient Identification Verified: Yes Signs or symptoms of abuse/neglect since last visito No Secondary Verification Process Completed: Yes Hospitalized since last visit: No Patient Requires Transmission-Based Precautions: No Implantable device outside of the clinic excluding No Patient Has Alerts: No cellular tissue based products placed in the center since last visit: Has Dressing in Place as Prescribed: Yes Pain Present Now: No Electronic Signature(s) Signed: 07/10/2021 10:00:41 AM By: Sandre Kitty Entered By: Sandre Kitty on 07/10/2021 09:50:49 -------------------------------------------------------------------------------- Compression Therapy Details Patient Name: Date of Service: Molly Hogan [name] Q000111Q 9:30 A M Medical Record Number: [id number] Patient Account Number: [id number] Date of Birth/Sex: Treating RN: [birth year] (71 y.o. Molly Hogan Primary Care Adrine Hayworth: Clydie Braun Other Clinician: Referring Gwynevere Lizana: Treating Taheerah Guldin/Extender: Beckey Downing in Treatment: 3 Compression Therapy Performed for Wound Assessment: Wound #1 Left,Anterior Lower  Leg Performed By: Clinician Lorrin Jackson, RN Compression Type: Three Layer Post Procedure Diagnosis Same as Pre-procedure Electronic Signature(s) Signed: 07/10/2021 5:17:32 PM By: Lorrin Jackson Entered By: Lorrin Jackson on 07/10/2021 10:33:35 -------------------------------------------------------------------------------- Lower Extremity Assessment Details Patient Name: Date of Service: Molly Hogan [name] Q000111Q 9:30 A M Medical Record Number: [id number] Patient Account Number: [id number] Date of Birth/Sex: Treating RN: July 12, 1950 (71 y.o. Molly Hogan Primary Care Hashir Deleeuw: Clydie Braun Other Clinician: Referring Yiselle Babich: Treating Tianne Plott/Extender: Artis Delay Weeks in Treatment: 3 Edema Assessment Assessed: [Left: Yes] [Right: No] Edema: [Left: Ye] [Right: s] Calf Left: Right: Point of Measurement: 31 cm From Medial Instep 34.2 cm Ankle Left: Right: Point of Measurement: 11 cm From Medial Instep 21 cm Vascular Assessment Pulses: Dorsalis Pedis Palpable: [Left:Yes] Electronic Signature(s) Signed: 07/10/2021 5:17:32 PM By: Lorrin Jackson Entered By: Lorrin Jackson on 07/10/2021 09:58:39 -------------------------------------------------------------------------------- Multi Wound Chart Details Patient Name: Date of Service: Molly Hogan [name] Q000111Q 9:30 A M Medical Record Number: [id number] Patient Account Number: [id number] Date of Birth/Sex: Treating RN: 20-Nov-1950 (71 y.o. F) Primary Care Molly Hogan: Clydie Braun Other Clinician: Referring Taliya Mcclard: Treating Theron Cumbie/Extender: Beckey Downing in Treatment: 3 Vital Signs Height(in): 62 Pulse(bpm): 71 Weight(lbs): 162 Blood Pressure(mmHg): 137/91 Body Mass Index(BMI): 29.6 Temperature(F): 97.8 Respiratory Rate(breaths/min): 18 Photos: [N/A:N/A] Left, Anterior Lower Leg N/A N/A Wound Location: Gradually Appeared N/A  N/A Wounding Event: Venous Leg Ulcer N/A N/A Primary Etiology: Arrhythmia, Osteoarthritis N/A N/A Comorbid History: 12/04/2020 N/A N/A Date Acquired: 3 N/A N/A Weeks of Treatment: Open N/A N/A Wound Status: No N/A N/A Wound Recurrence: 2x1.4x0.1 N/A N/A Measurements L x W x D (cm) 2.199 N/A N/A A (cm) : rea 0.22 N/A N/A Volume (cm) : 18.90% N/A N/A % Reduction in Area: 18.80% N/A N/A % Reduction in Volume: Full Thickness With Exposed Support N/A N/A Classification: Structures Medium N/A N/A Exudate A mount: Serosanguineous N/A N/A Exudate Type: red, brown N/A N/A Exudate Color: Distinct, outline attached N/A  N/A Wound Margin: Medium (34-66%) N/A N/A Granulation A mount: Red, Pink N/A N/A Granulation Quality: Medium (34-66%) N/A N/A Necrotic A mount: Fat Layer (Subcutaneous Tissue): Yes N/A N/A Exposed Structures: Fascia: No Tendon: No Muscle: No Joint: No Bone: No Small (1-33%) N/A N/A Epithelialization: Debridement - Excisional N/A N/A Debridement: Pre-procedure Verification/Time Out 10:24 N/A N/A Taken: Other N/A N/A Pain Control: Subcutaneous, Slough N/A N/A Tissue Debrided: Skin/Subcutaneous Tissue N/A N/A Level: 2.8 N/A N/A Debridement A (sq cm): rea Curette, Other(Silver Nitrate) N/A N/A Instrument: Minimum N/A N/A Bleeding: Pressure N/A N/A Hemostasis A chieved: Procedure was tolerated well N/A N/A Debridement Treatment Response: 2x1.4x0.1 N/A N/A Post Debridement Measurements L x W x D (cm) 0.22 N/A N/A Post Debridement Volume: (cm) Compression Therapy N/A N/A Procedures Performed: Debridement Treatment Notes Electronic Signature(s) Signed: 07/10/2021 12:34:06 PM By: Kalman Shan DO Entered By: Kalman Shan on 07/10/2021 10:59:41 -------------------------------------------------------------------------------- Multi-Disciplinary Care Plan Details Patient Name: Date of Service: Molly Hogan [name] Q000111Q 9:30 A  M Medical Record Number: [id number] Patient Account Number: [id number] Date of Birth/Sex: Treating RN: Sep 11, 1950 (71 y.o. Molly Hogan Primary Care Salam Chesterfield: Clydie Braun Other Clinician: Referring Molly Hogan: Treating Klara Stjames/Extender: Beckey Downing in Treatment: 3 Multidisciplinary Care Plan reviewed with physician Active Inactive Venous Leg Ulcer Nursing Diagnoses: Potential for venous Insuffiency (use before diagnosis confirmed) Goals: Patient/caregiver will verbalize understanding of disease process and disease management Date Initiated: 06/18/2021 Target Resolution Date: 07/16/2021 Goal Status: Active Interventions: Provide education on venous insufficiency Notes: Electronic Signature(s) Signed: 07/10/2021 10:06:05 AM By: Lorrin Jackson Entered By: Lorrin Jackson on 07/10/2021 10:06:05 -------------------------------------------------------------------------------- Pain Assessment Details Patient Name: Date of Service: Molly Hogan [name] Q000111Q 9:30 A M Medical Record Number: [id number] Patient Account Number: [id number] Date of Birth/Sex: Treating RN: 27-May-1950 (71 y.o. Molly Hogan Primary Care Alexio Sroka: Clydie Braun Other Clinician: Referring Ashana Tullo: Treating Jordie Skalsky/Extender: Beckey Downing in Treatment: 3 Active Problems Location of Pain Severity and Description of Pain Patient Has Paino No Site Locations Pain Management and Medication Current Pain Management: Electronic Signature(s) Signed: 07/10/2021 5:17:32 PM By: Lorrin Jackson Entered By: Lorrin Jackson on 07/10/2021 09:52:48 -------------------------------------------------------------------------------- Patient/Caregiver Education Details Patient Name: Date of Service: Molly Hogan [id number] [place] Record Number: [id number] Patient Account Number: [id number] Date of Birth/Gender: Treating  RN: [birth year] (71 y.o. Molly Hogan Primary Care Physician: Clydie Braun Other Clinician: Referring Physician: Treating Physician/Extender: Beckey Downing in Treatment: 3 Education Assessment Education Provided To: Patient Education Topics Provided Venous: Methods: Explain/Verbal, Printed Responses: State content correctly Wound/Skin Impairment: Methods: Explain/Verbal, Printed Responses: State content correctly Electronic Signature(s) Signed: 07/10/2021 5:17:32 PM By: Lorrin Jackson Entered By: Lorrin Jackson on 07/10/2021 10:06:29 -------------------------------------------------------------------------------- Wound Assessment Details Patient Name: Date of Service: Molly Hogan [name] Q000111Q 9:30 A M Medical Record Number: [id number] Patient Account Number: [id number] Date of Birth/Sex: Treating RN: 1950/08/28 (71 y.o. Molly Hogan Primary Care Lisa Blakeman: Clydie Braun Other Clinician: Referring Jacquees Gongora: Treating Cedricka Sackrider/Extender: Artis Delay Weeks in Treatment: 3 Wound Status Wound Number: 1 Primary Etiology: Venous Leg Ulcer Wound Location: Left, Anterior Lower Leg Wound Status: Open Wounding Event: Gradually Appeared Comorbid History: Arrhythmia, Osteoarthritis Date Acquired: 12/04/2020 Weeks Of Treatment: 3 Clustered Wound: No Photos Wound Measurements Length: (cm) 2 Width: (cm) 1.4 Depth: (cm) 0.1 Area: (cm) 2.199 Volume: (cm) 0.22 % Reduction in Area: 18.9% % Reduction in Volume: 18.8% Epithelialization: Small (1-33%) Tunneling: No Undermining: No Wound Description  Classification: Full Thickness With Exposed Support Structures Wound Margin: Distinct, outline attached Exudate Amount: Medium Exudate Type: Serosanguineous Exudate Color: red, brown Wound Bed Granulation Amount: Medium (34-66%) Granulation Quality: Red, Pink Necrotic Amount: Medium (34-66%) Necrotic  Quality: Adherent Slough Foul Odor After Cleansing: No Slough/Fibrino Yes Exposed Structure Fascia Exposed: No Fat Layer (Subcutaneous Tissue) Exposed: Yes Tendon Exposed: No Muscle Exposed: No Joint Exposed: No Bone Exposed: No Electronic Signature(s) Signed: 07/10/2021 5:17:32 PM By: Lorrin Jackson Entered By: Lorrin Jackson on 07/10/2021 09:59:47 -------------------------------------------------------------------------------- Vitals Details Patient Name: Date of Service: Molly Hogan [name] Q000111Q 9:30 A M Medical Record Number: [id number] Patient Account Number: [id number] Date of Birth/Sex: Treating RN: 1950/10/26 (71 y.o. Molly Hogan Primary Care Earnestene Angello: Clydie Braun Other Clinician: Referring Ahlayah Tarkowski: Treating Tagen Milby/Extender: Beckey Downing in Treatment: 3 Vital Signs Time Taken: 09:52 Temperature (F): 97.8 Height (in): 62 Pulse (bpm): 71 Weight (lbs): 162 Respiratory Rate (breaths/min): 18 Body Mass Index (BMI): 29.6 Blood Pressure (mmHg): 137/91 Reference Range: 80 - 120 mg / dl Electronic Signature(s) Signed: 07/10/2021 5:17:32 PM By: Lorrin Jackson Entered By: Lorrin Jackson on 07/10/2021 09:52:38

## 2021-07-17 ENCOUNTER — Other Ambulatory Visit: Payer: Self-pay

## 2021-07-17 ENCOUNTER — Encounter (HOSPITAL_BASED_OUTPATIENT_CLINIC_OR_DEPARTMENT_OTHER): Payer: Medicare Other | Admitting: General Surgery

## 2021-07-17 DIAGNOSIS — I87312 Chronic venous hypertension (idiopathic) with ulcer of left lower extremity: Secondary | ICD-10-CM | POA: Diagnosis not present

## 2021-07-17 DIAGNOSIS — I8312 Varicose veins of left lower extremity with inflammation: Secondary | ICD-10-CM | POA: Diagnosis not present

## 2021-07-17 DIAGNOSIS — I8311 Varicose veins of right lower extremity with inflammation: Secondary | ICD-10-CM | POA: Diagnosis not present

## 2021-07-17 DIAGNOSIS — R600 Localized edema: Secondary | ICD-10-CM | POA: Diagnosis not present

## 2021-07-17 DIAGNOSIS — I83202 Varicose veins of unspecified lower extremity with both ulcer of calf and inflammation: Secondary | ICD-10-CM | POA: Diagnosis not present

## 2021-07-17 DIAGNOSIS — L97822 Non-pressure chronic ulcer of other part of left lower leg with fat layer exposed: Secondary | ICD-10-CM | POA: Diagnosis not present

## 2021-07-20 NOTE — Progress Notes (Signed)
Flinchbaugh Nayanna C. ([id number]) ?, ?Visit Report for 07/17/2021 ?SuperBill Details ?Patient Name: Date of Service: ?[name], [name] 07/17/2021 ?Medical Record Number: [id number] ?Patient Account Number: [id number] ?Date of Birth/Sex: Treating RN: ?1950-07-10 (71 y.o. Molly Hogan, Molly Hogan ?Primary Care Provider: Maurie Boettcher Other Clinician: ?Referring Provider: ?Treating Provider/Extender: Duanne Guess ?Maurie Boettcher ?Weeks in Treatment: 4 ?Diagnosis Coding ?ICD-10 Codes ?Code Description ?D43.735 Chronic venous hypertension (idiopathic) with ulcer of left lower extremity ?D89.784 Non-pressure chronic ulcer of other part of left lower leg with fat layer exposed ?Facility Procedures ?CPT4 Code Description Modifier Quantity ?[id number] 81388 - WOUND CARE VISIT-LEV 3 EST PT 1 ?Electronic Signature(s) ?Signed: 07/18/2021 6:42:44 PM By: Duanne Guess MD FACS ?Signed: 07/20/2021 12:57:05 PM By: Fonnie Mu RN ?Entered By: Fonnie Mu on 07/17/2021 11:25:35 ?

## 2021-07-20 NOTE — Progress Notes (Signed)
Winzer Julicia C. ([id number]) ?, ?Visit Report for 07/17/2021 ?Arrival Information Details ?Patient Name: Date of Service: ?Molly Molly DoughtyLLICUTT, Molly [name] 07/17/2021 10:30 A M ?Medical Record Number: [id number] ?Patient Account Number: [id number] ?Date of Birth/Sex: Treating RN: ?06-03-1950 (71 y.o. Molly Hogan) Breedlove, Lauren ?Primary Care Kao Berkheimer: [name], Meredith Other Clinician: ?Referring Tamorah Hada: ?Treating Corby Villasenor/Extender: Duanne Guessannon, Jennifer ?[name], Meredith ?Weeks in Treatment: 4 ?Visit Information History Since Last Visit ?Added or deleted any medications: No ?Patient Arrived: Ambulatory ?Any new allergies or adverse reactions: No ?Arrival Time: 11:01 ?Had a fall or experienced change in No ?Accompanied By: self ?activities of daily living that may affect ?Transfer Assistance: None ?risk of falls: ?Patient Identification Verified: Yes ?Signs or symptoms of abuse/neglect since last visito No ?Secondary Verification Process Completed: Yes ?Hospitalized since last visit: No ?Patient Requires Transmission-Based Precautions: No ?Implantable device outside of the clinic excluding No ?Patient Has Alerts: No ?cellular tissue based products placed in the center ?since last visit: ?Has Dressing in Place as Prescribed: Yes ?Has Compression in Place as Prescribed: Yes ?Pain Present Now: No ?Electronic Signature(s) ?Signed: 07/20/2021 12:57:05 PM By: Fonnie MuBreedlove, Lauren RN ?Entered By: Fonnie MuBreedlove, Lauren on 07/17/2021 11:02:08 ?-------------------------------------------------------------------------------- ?Clinic Level of Care Assessment Details ?Patient Name: Date of Service: ?Molly Molly DoughtyLLICUTT, Molly [name] 07/17/2021 10:30 A M ?Medical Record Number: [id number] ?Patient Account Number: [id number] ?Date of Birth/Sex: Treating RN: ?06-03-1950 (71 y.o. Molly Hogan) Breedlove, Lauren ?Primary Care Lesley Galentine: [name], Meredith Other Clinician: ?Referring Genie Wenke: ?Treating Jetty Berland/Extender: Duanne Guessannon, Jennifer ?[name], Meredith ?Weeks in Treatment:  4 ?Clinic Level of Care Assessment Items ?TOOL 4 Quantity Score ?X- 1 0 ?Use when only an EandM is performed on FOLLOW-UP visit ?ASSESSMENTS - Nursing Assessment / Reassessment ?X- 1 10 ?Reassessment of Co-morbidities (includes updates in patient status) ?X- 1 5 ?Reassessment of Adherence to Treatment Plan ?ASSESSMENTS - Wound and Skin A ssessment / Reassessment ?X - Simple Wound Assessment / Reassessment - one wound 1 5 ?[]  - 0 ?Complex Wound Assessment / Reassessment - multiple wounds ?[]  - 0 ?Dermatologic / Skin Assessment (not related to wound area) ?ASSESSMENTS - Focused Assessment ?[]  - 0 ?Circumferential Edema Measurements - multi extremities ?[]  - 0 ?Nutritional Assessment / Counseling / Intervention ?[]  - 0 ?Lower Extremity Assessment (monofilament, tuning fork, pulses) ?[]  - 0 ?Peripheral Arterial Disease Assessment (using hand held doppler) ?ASSESSMENTS - Ostomy and/or Continence Assessment and Care ?[]  - 0 ?Incontinence Assessment and Management ?[]  - 0 ?Ostomy Care Assessment and Management (repouching, etc.) ?PROCESS - Coordination of Care ?X - Simple Patient / Family Education for ongoing care 1 15 ?[]  - 0 ?Complex (extensive) Patient / Family Education for ongoing care ?X- 1 10 ?Staff obtains Consents, Records, T Results / Process Orders ?est ?[]  - 0 ?Staff telephones HHA, Nursing Homes / Clarify orders / etc ?[]  - 0 ?Routine Transfer to another Facility (non-emergent condition) ?[]  - 0 ?Routine Hospital Admission (non-emergent condition) ?[]  - 0 ?New Admissions / Manufacturing engine[employer]surance Authorizations / Ordering NPWT Apligraf, etc. ?, ?[]  - 0 ?Emergency Hospital Admission (emergent condition) ?X- 1 10 ?Simple Discharge Coordination ?[]  - 0 ?Complex (extensive) Discharge Coordination ?PROCESS - Special Needs ?[]  - 0 ?Pediatric / Minor Patient Management ?[]  - 0 ?Isolation Patient Management ?[]  - 0 ?Hearing / Language / Visual special needs ?[]  - 0 ?Assessment of Community assistance (transportation, D/C planning,  etc.) ?[]  - 0 ?Additional assistance / Altered mentation ?[]  - 0 ?Support Surface(s) Assessment (bed, cushion, seat, etc.) ?INTERVENTIONS - Wound Cleansing / Measurement ?X - Simple Wound Cleansing - one wound  1 5 ?  - 0 ?Complex Wound Cleansing - multiple wounds ?X- 1 5 ?Wound Imaging (photographs - any number of wounds) ?  - 0 ?Wound Tracing (instead of photographs) ?X- 1 5 ?Simple Wound Measurement - one wound ?  - 0 ?Complex Wound Measurement - multiple wounds ?INTERVENTIONS - Wound Dressings ?X - Small Wound Dressing one or multiple wounds 1 10 ?  - 0 ?Medium Wound Dressing one or multiple wounds ?  - 0 ?Large Wound Dressing one or multiple wounds ?  - 0 ?Application of Medications - topical ?  - 0 ?Application of Medications - injection ?INTERVENTIONS - Miscellaneous ?  - 0 ?External ear exam ?  - 0 ?Specimen Collection (cultures, biopsies, blood, body fluids, etc.) ?  - 0 ?Specimen(s) / Culture(s) sent or taken to Lab for analysis ?  - 0 ?Patient Transfer (multiple staff / Nurse, adult / Similar devices) ?  - 0 ?Simple Staple / Suture removal (25 or less) ?  - 0 ?Complex Staple / Suture removal (26 or more) ?  - 0 ?Hypo / Hyperglycemic Management (close monitor of Blood Glucose) ?  - 0 ?Ankle / Brachial Index (ABI) - do not check if billed separately ?X- 1 5 ?Vital Signs ?Has the patient been seen at the hospital within the last three years: Yes ?Total Score: 85 ?Level Of Care: New/Established - Level 3 ?Electronic Signature(s) ?Signed: 07/20/2021 12:57:05 PM By: Fonnie Mu RN ?Entered By: Fonnie Mu on 07/17/2021 11:25:26 ?-------------------------------------------------------------------------------- ?Encounter Discharge Information Details ?Patient Name: Date of Service: ?Molly Molly Doughty [name] 07/17/2021 10:30 A M ?Medical Record Number: [id number] ?Patient Account Number: [id number] ?Date of Birth/Sex: Treating RN: ?1950/10/07 (71 y.o. Molly Hogan, Lauren ?Primary Care Khristian Seals:  Maurie Boettcher Other Clinician: ?Referring Oseias Horsey: ?Treating Sofya Moustafa/Extender: Duanne Guess ?Maurie Boettcher ?Weeks in Treatment: 4 ?Encounter Discharge Information Items ?Discharge Condition: Stable ?Ambulatory Status: Ambulatory ?Discharge Destination: Home ?Transportation: Private Auto ?Accompanied By: self ?Schedule Follow-up Appointment: Yes ?Clinical Summary of Care: Patient Declined ?Electronic Signature(s) ?Signed: 07/20/2021 12:57:05 PM By: Fonnie Mu RN ?Entered By: Fonnie Mu on 07/17/2021 11:24:20 ?-------------------------------------------------------------------------------- ?Patient/Caregiver Education Details ?Patient Name: Date of Service: ?Molly Hogan, Molly [name] 3/14/2023andnbsp10:30 A M ?Medical Record Number: [id number] ?Patient Account Number: [id number] ?Date of Birth/Gender: Treating RN: ?1950-06-26 (71 y.o. Molly Hogan, Lauren ?Primary Care Physician: Maurie Boettcher Other Clinician: ?Referring Physician: ?Treating Physician/Extender: Duanne Guess ?Maurie Boettcher ?Weeks in Treatment: 4 ?Education Assessment ?Education Provided To: ?Patient ?Education Topics Provided ?Venous: ?Methods: Explain/Verbal ?Responses: Reinforcements needed, State content correctly ?Electronic Signature(s) ?Signed: 07/20/2021 12:57:05 PM By: Fonnie Mu RN ?Entered By: Fonnie Mu on 07/17/2021 11:24:00 ?-------------------------------------------------------------------------------- ?Wound Assessment Details ?Patient Name: ?Date of Service: ?Molly Molly Doughty [name] 07/17/2021 10:30 A M ?Medical Record Number: [id number] ?Patient Account Number: [id number] ?Date of Birth/Sex: ?Treating RN: ?25-Dec-1950 (71 y.o. Molly Hogan, Lauren ?Primary Care Amier Hoyt: Maurie Boettcher ?Other Clinician: ?Referring Gabbie Marzo: ?Treating Collins Dimaria/Extender: Duanne Guess ?Maurie Boettcher ?Weeks in Treatment: 4 ?Wound Status ?Wound Number: 1 ?Primary Etiology: Venous Leg Ulcer ?Wound  Location: Left, Anterior Lower Leg ?Wound Status: Open ?Wounding Event: Gradually Appeared ?Date Acquired: 12/04/2020 ?Weeks Of Treatment: 4 ?Clustered Wound: No ?Wound Measurements ?Length: (cm) 2 ?Width: (

## 2021-07-26 ENCOUNTER — Encounter (HOSPITAL_BASED_OUTPATIENT_CLINIC_OR_DEPARTMENT_OTHER): Payer: Medicare Other | Admitting: Internal Medicine

## 2021-07-26 ENCOUNTER — Other Ambulatory Visit: Payer: Self-pay

## 2021-07-26 DIAGNOSIS — I87312 Chronic venous hypertension (idiopathic) with ulcer of left lower extremity: Secondary | ICD-10-CM

## 2021-07-26 DIAGNOSIS — L97822 Non-pressure chronic ulcer of other part of left lower leg with fat layer exposed: Secondary | ICD-10-CM

## 2021-07-26 NOTE — Progress Notes (Signed)
Molly Darrian C. ([id number]) ?, ?Visit Report for 07/26/2021 ?Chief Complaint Document Details ?Patient Name: Date of Service: ?CA Molly Hogan, [name] 07/26/2021 9:30 A M ?Medical Record Number: [id number] ?Patient Account Number: [id number] ?Date of Birth/Sex: Treating RN: ?13-Aug-1950 (71 y.o. Molly Hogan) Hogan, Molly ?Primary Care Provider: [name], Molly Hogan Other Clinician: ?Referring Provider: ?Treating Provider/Extender: Molly Hogan, Darlene Brozowski ?[name], Molly Hogan ?Weeks in Treatment: 5 ?Information Obtained from: Patient ?Chief Complaint ?Left lower extremity wound ?Electronic Signature(s) ?Signed: 07/26/2021 10:28:36 AM By: Molly Hogan, Lillyahna Hemberger DO ?Entered By: Molly Hogan, Mousa Prout on 07/26/2021 10:24:13 ?-------------------------------------------------------------------------------- ?HPI Details ?Patient Name: Date of Service: ?CA Molly Hogan, [name] 07/26/2021 9:30 A M ?Medical Record Number: [id number] ?Patient Account Number: [id number] ?Date of Birth/Sex: Treating RN: ?13-Aug-1950 (71 y.o. Molly Hogan) Hogan, Molly ?Primary Care Provider: [name], Molly Hogan Other Clinician: ?Referring Provider: ?Treating Provider/Extender: Molly Hogan, [place] ?[name], Molly Hogan ?Weeks in Treatment: 5 ?History of Present Illness ?HPI Description: Admission 06/18/2021 ?Ms. Molly LowJeannie Hogan is a 71 year old female with a past medical history of chronic venous insufficiency that presents to the clinic for a 765-month history of ?nonhealing wound to her left lower extremity. She states this happened when she had a dishwasher door. She has been following with Dr. Lequita HaltMorgan at [facility] for this issue. She started with calcium alginate and Adaptic and now is currently using Medihoney. She reports tenderness to the wound site. She ?denies systemic signs of infection. She has compression stockings but does not wear these daily. ?2/20; patient presents for follow-up. She has no issues or complaints today. She denies signs of infection. She tolerated the  compression wrap well. ?2/27; patient presents for follow-up. She tolerated the 3 layer compression wrap well. She has no issues or complaints today. ?3/7; patient presents for follow-up. She has no issues or complaints today. ?3/23; patient presents for follow-up. She states she followed up with [facility] and she is scheduled to have ablations done in April to her ?lower extremities. The 3 layer compression wrap was taken off at that visit and she has been using her compression stocking. She reports improvement in ?wound healing. She has no issues or complaints today. ?Electronic Signature(s) ?Signed: 07/26/2021 10:28:36 AM By: Molly Hogan, Molly Dettmann DO ?Entered By: Molly Hogan, Haddie Bruhl on 07/26/2021 10:25:17 ?-------------------------------------------------------------------------------- ?Physical Exam Details ?Patient Name: Date of Service: ?CA Molly Hogan, [name] 07/26/2021 9:30 A M ?Medical Record Number: [id number] ?Patient Account Number: [id number] ?Date of Birth/Sex: Treating RN: ?13-Aug-1950 (71 y.o. Molly Hogan) Hogan, Molly ?Primary Care Provider: [name], Molly Hogan Other Clinician: ?Referring Provider: ?Treating Provider/Extender: Molly Hogan, Kendrik Mcshan ?[name], Molly Hogan ?Weeks in Treatment: 5 ?Constitutional ?respirations regular, non-labored and within target range for patient.Marland Kitchen. ?Cardiovascular ?2+ dorsalis pedis/posterior tibialis pulses. ?Psychiatric ?pleasant and cooperative. ?Notes ?Left lower extremity: T the anterior aspect there is an open wound with granulation tissue throughout. Venous stasis dermatitis and varicose veins noted. 2+ ?o ?pitting edema to the knee ?Electronic Signature(s) ?Signed: 07/26/2021 10:28:36 AM By: Molly Hogan, Molly Blake DO ?Entered By: Molly Hogan, Molly Hogan on 07/26/2021 10:25:52 ?-------------------------------------------------------------------------------- ?Physician Orders Details ?Patient Name: Date of Service: ?CA Molly Hogan, [name] 07/26/2021 9:30 A M ?Medical Record Number:  [id number] ?Patient Account Number: [id number] ?Date of Birth/Sex: Treating RN: ?13-Aug-1950 (71 y.o. [name]) Hogan, Molly ?Primary Care Provider: [name], Molly Hogan Other Clinician: ?Referring Provider: ?Treating Provider/Extender: Molly Hogan, Molly Hogan ?[name], Molly Hogan ?Weeks in Treatment: 5 ?Verbal / Phone Orders: No ?Diagnosis Coding ?Follow-up Appointments ?ppointment in 1 week. - Dr. Mikey Hogan and Molly LaityJodi Room # 7 ?Return A ?Bathing/ Shower/ Hygiene ?May shower with protection but do not get wound dressing(s) wet. - ***  If showering, please use cast protector*** These can be purchased from ?local pharmacies such as [facility] or [facility] ?Edema Control - Lymphedema / SCD / Other ?Bilateral Lower Extremities ?Elevate legs to the level of the heart or above for 30 minutes daily and/or when sitting, a frequency of: ?Avoid standing for long periods of time. ?Patient to wear own compression stockings every day. - both legs ?Exercise regularly ?Compression stocking or Garment 20-30 mm/Hg pressure to: ?Wound Treatment ?Wound #1 - Lower Leg Wound Laterality: Left, Anterior ?Cleanser: Soap and Water 1 x Per Week ?Discharge Instructions: May shower and wash wound with dial antibacterial soap and water prior to dressing change. ?Cleanser: Wound Cleanser 1 x Per Week ?Discharge Instructions: Cleanse the wound with wound cleanser prior to applying a clean dressing using gauze sponges, not tissue or cotton balls. ?Prim Dressing: Hydrofera Blue Ready Foam, 2.5 x2.5 in ?ary 1 x Per Week ?Discharge Instructions: Apply to wound bed as instructed ?Secondary Dressing: Bordered Gauze, 4x4 in 1 x Per Week ?Discharge Instructions: Apply over primary dressing as directed. ?Electronic Signature(s) ?Signed: 07/26/2021 10:28:36 AM By: Molly Corwin DO ?Entered By: Molly Corwin on 07/26/2021 10:26:08 ?-------------------------------------------------------------------------------- ?Problem List Details ?Patient Name: ?Date of Service: ?CA Molly Hogan [name] 07/26/2021 9:30 A M ?Medical Record Number: [id number] ?Patient Account Number: [id number] ?Date of Birth/Sex: ?Treating RN: ?1951-03-15 (71 y.o. Molly Cluck ?Primary Care Provider: Maurie Boettcher ?Other Clinician: ?Referring Provider: ?Treating Provider/Extender: Molly Corwin ?Maurie Boettcher ?Weeks in Treatment: 5 ?Active Problems ?ICD-10 ?Encounter ?Code Description Active Date MDM ?Diagnosis ?W09.811 Chronic venous hypertension (idiopathic) with ulcer of left lower extremity 06/18/2021 No Yes ?B14.782 Non-pressure chronic ulcer of other part of left lower leg with fat layer exposed2/13/2023 No Yes ?Inactive Problems ?Resolved Problems ?Electronic Signature(s) ?Signed: 07/26/2021 10:28:36 AM By: Molly Corwin DO ?Entered By: Molly Corwin on 07/26/2021 10:21:04 ?-------------------------------------------------------------------------------- ?Progress Note Details ?Patient Name: ?Date of Service: ?CA Molly Hogan [name] 07/26/2021 9:30 A M ?Medical Record Number: [id number] ?Patient Account Number: [id number] ?Date of Birth/Sex: ?Treating RN: ?[birth year] (71 y.o. Molly Cluck ?Primary Care Provider: Maurie Boettcher ?Other Clinician: ?Referring Provider: ?Treating Provider/Extender: Molly Corwin ?Maurie Boettcher ?Weeks in Treatment: 5 ?Subjective ?Chief Complaint ?Information obtained from Patient ?Left lower extremity wound ?History of Present Illness (HPI) ?Admission 06/18/2021 ?Ms. Molly Hogan is a 71 year old female with a past medical history of chronic venous insufficiency that presents to the clinic for a 80-month history of ?nonhealing wound to her left lower extremity. She states this happened when she had a dishwasher door. She has been following with Dr. Lequita Halt at [facility] for this issue. She started with calcium alginate and Adaptic and now is currently using Medihoney. She reports tenderness to the wound site. She ?denies systemic signs of  infection. She has compression stockings but does not wear these daily. ?2/20; patient presents for follow-up. She has no issues or complaints today. She denies signs of infection. She tolerated the compression wrap well.

## 2021-07-27 NOTE — Progress Notes (Signed)
Molly Zetta Hogan. ([id number]) ?, ?Visit Report for 07/26/2021 ?Arrival Information Details ?Patient Name: Date of Service: ?CA [name], Molly NNIE Hogan. 07/26/2021 9:30 A M ?Medical Record Number: [id number] ?Patient Account Number: [id number] ?Date of Birth/Sex: Treating RN: ?02-May-1951 (71 y.o. [name]) Hogan, Molly ?Primary Care [name]: [name], Meredith Other Clinician: ?Referring [name]: ?Treating [name]/Extender: [name], Jessica ?[name], Meredith ?Weeks in Treatment: 5 ?Visit Information History Since Last Visit ?Added or deleted any medications: No ?Patient Arrived: Ambulatory ?Any new allergies or adverse reactions: No ?Arrival Time: 09:33 ?Had a fall or experienced change in No ?Accompanied By: self ?activities of daily living that may affect ?Transfer Assistance: None ?risk of falls: ?Patient Identification Verified: Yes ?Signs or symptoms of abuse/neglect since last visito No ?Secondary Verification Process Completed: Yes ?Hospitalized since last visit: No ?Patient Requires Transmission-Based Precautions: No ?Implantable device outside of the clinic excluding No ?Patient Has Alerts: No ?cellular tissue based products placed in the center ?since last visit: ?Has Dressing in Place as Prescribed: Yes ?Has Compression in Place as Prescribed: Yes ?Pain Present Now: No ?Electronic Signature(s) ?Signed: 07/27/2021 12:10:41 PM By: Fonnie MuBreedlove, Lauren RN ?Entered By: Fonnie MuBreedlove, Molly on 07/26/2021 09:34:14 ?-------------------------------------------------------------------------------- ?Clinic Level of Care Assessment Details ?Patient Name: Date of Service: ?CA [name], Molly NNIE Hogan. 07/26/2021 9:30 A M ?Medical Record Number: [id number] ?Patient Account Number: [id number] ?Date of Birth/Sex: Treating RN: ?02-May-1951 (71 y.o. [name]) Hogan, Molly ?Primary Care [name]: [name], Meredith Other Clinician: ?Referring [name]: ?Treating Viriginia Amendola/Extender: [name], Jessica ?[name], Meredith ?Weeks in Treatment:  5 ?Clinic Level of Care Assessment Items ?TOOL 4 Quantity Score ?X- 1 0 ?Use when only an EandM is performed on FOLLOW-UP visit ?ASSESSMENTS - Nursing Assessment / Reassessment ?X- 1 10 ?Reassessment of Co-morbidities (includes updates in patient status) ?X- 1 5 ?Reassessment of Adherence to Treatment Plan ?ASSESSMENTS - Wound and Skin A ssessment / Reassessment ?X - Simple Wound Assessment / Reassessment - one wound 1 5 ?[]  - 0 ?Complex Wound Assessment / Reassessment - multiple wounds ?[]  - 0 ?Dermatologic / Skin Assessment (not related to wound area) ?ASSESSMENTS - Focused Assessment ?X- 1 5 ?Circumferential Edema Measurements - multi extremities ?[]  - 0 ?Nutritional Assessment / Counseling / Intervention ?[]  - 0 ?Lower Extremity Assessment (monofilament, tuning fork, pulses) ?[]  - 0 ?Peripheral Arterial Disease Assessment (using hand held doppler) ?ASSESSMENTS - Ostomy and/or Continence Assessment and Care ?[]  - 0 ?Incontinence Assessment and Management ?[]  - 0 ?Ostomy Care Assessment and Management (repouching, etc.) ?PROCESS - Coordination of Care ?X - Simple Patient / Family Education for ongoing care 1 15 ?[]  - 0 ?Complex (extensive) Patient / Family Education for ongoing care ?X- 1 10 ?Staff obtains Consents, Records, T Results / Process Orders ?est ?[]  - 0 ?Staff telephones HHA, Nursing Homes / Clarify orders / etc ?[]  - 0 ?Routine Transfer to another Facility (non-emergent condition) ?[]  - 0 ?Routine Hospital Admission (non-emergent condition) ?[]  - 0 ?New Admissions / Manufacturing engineernsurance Authorizations / Ordering NPWT Apligraf, etc. ?, ?[]  - 0 ?Emergency Hospital Admission (emergent condition) ?X- 1 10 ?Simple Discharge Coordination ?[]  - 0 ?Complex (extensive) Discharge Coordination ?PROCESS - Special Needs ?[]  - 0 ?Pediatric / Minor Patient Management ?[]  - 0 ?Isolation Patient Management ?[]  - 0 ?Hearing / Language / Visual special needs ?[]  - 0 ?Assessment of Community assistance (transportation, D/Hogan planning,  etc.) ?[]  - 0 ?Additional assistance / Altered mentation ?[]  - 0 ?Support Surface(s) Assessment (bed, cushion, seat, etc.) ?INTERVENTIONS - Wound Cleansing / Measurement ?X - Simple Wound Cleansing - one wound  1 5 ?  - 0 ?Complex Wound Cleansing - multiple wounds ?X- 1 5 ?Wound Imaging (photographs - any number of wounds) ?  - 0 ?Wound Tracing (instead of photographs) ?X- 1 5 ?Simple Wound Measurement - one wound ?  - 0 ?Complex Wound Measurement - multiple wounds ?INTERVENTIONS - Wound Dressings ?X - Small Wound Dressing one or multiple wounds 1 10 ?  - 0 ?Medium Wound Dressing one or multiple wounds ?  - 0 ?Large Wound Dressing one or multiple wounds ?X- 1 5 ?Application of Medications - topical ?  - 0 ?Application of Medications - injection ?INTERVENTIONS - Miscellaneous ?  - 0 ?External ear exam ?  - 0 ?Specimen Collection (cultures, biopsies, blood, body fluids, etc.) ?  - 0 ?Specimen(s) / Culture(s) sent or taken to Lab for analysis ?  - 0 ?Patient Transfer (multiple staff / Nurse, adult / Similar devices) ?  - 0 ?Simple Staple / Suture removal (25 or less) ?  - 0 ?Complex Staple / Suture removal (26 or more) ?  - 0 ?Hypo / Hyperglycemic Management (close monitor of Blood Glucose) ?  - 0 ?Ankle / Brachial Index (ABI) - do not check if billed separately ?X- 1 5 ?Vital Signs ?Has the patient been seen at the hospital within the last three years: Yes ?Total Score: 95 ?Level Of Care: New/Established - Level 3 ?Electronic Signature(s) ?Signed: 07/27/2021 12:10:41 PM By: Fonnie Mu RN ?Entered By: Fonnie Mu on 07/26/2021 09:55:44 ?-------------------------------------------------------------------------------- ?Encounter Discharge Information Details ?Patient Name: Date of Service: ?CA Devonne Hogan NNIE Hogan. 07/26/2021 9:30 A M ?Medical Record Number: [id number] ?Patient Account Number: [id number] ?Date of Birth/Sex: Treating RN: ?04/16/1951 (71 y.o. Ardis Rowan, Molly ?Primary Care Prentiss Polio:  Maurie Boettcher Other Clinician: ?Referring Nishita Isaacks: ?Treating Pualani Borah/Extender: Geralyn Corwin ?Maurie Boettcher ?Weeks in Treatment: 5 ?Encounter Discharge Information Items ?Discharge Condition: Stable ?Ambulatory Status: Ambulatory ?Discharge Destination: Home ?Transportation: Private Auto ?Accompanied By: self ?Schedule Follow-up Appointment: Yes ?Clinical Summary of Care: Patient Declined ?Electronic Signature(s) ?Signed: 07/27/2021 12:10:41 PM By: Fonnie Mu RN ?Entered By: Fonnie Mu on 07/26/2021 09:57:16 ?-------------------------------------------------------------------------------- ?Lower Extremity Assessment Details ?Patient Name: Date of Service: ?CA Devonne Hogan NNIE Hogan. 07/26/2021 9:30 A M ?Medical Record Number: [id number] ?Patient Account Number: [id number] ?Date of Birth/Sex: Treating RN: ?Nov 12, 1950 (71 y.o. Ardis Rowan, Molly ?Primary Care Mohid Furuya: Maurie Boettcher Other Clinician: ?Referring Per Beagley: ?Treating Niccole Witthuhn/Extender: Geralyn Corwin ?Maurie Boettcher ?Weeks in Treatment: 5 ?Edema Assessment ?Assessed: [Left: Yes] [Right: No] ?Edema: [Left: Ye] [Right: s] ?Calf ?Left: Right: ?Point of Measurement: 31 cm From Medial Instep 34.2 cm ?Ankle ?Left: Right: ?Point of Measurement: 11 cm From Medial Instep 21 cm ?Vascular Assessment ?Pulses: ?Dorsalis Pedis ?Palpable: [Left:Yes] ?Posterior Tibial ?Palpable: [Left:Yes] ?Electronic Signature(s) ?Signed: 07/27/2021 12:10:41 PM By: Fonnie Mu RN ?Entered By: Fonnie Mu on 07/26/2021 09:45:11 ?-------------------------------------------------------------------------------- ?Multi Wound Chart Details ?Patient Name: ?Date of Service: ?CA Devonne Hogan NNIE Hogan. 07/26/2021 9:30 A M ?Medical Record Number: [id number] ?Patient Account Number: [id number] ?Date of Birth/Sex: ?Treating RN: ?Jul 15, 1950 (71 y.o. Roel Cluck ?Primary Care Hanan Mcwilliams: Maurie Boettcher ?Other Clinician: ?Referring Markey Deady: ?Treating  Delayne Sanzo/Extender: Geralyn Corwin ?Maurie Boettcher ?Weeks in Treatment: 5 ?Vital Signs ?Height(in): 62 ?Pulse(bpm): 63 ?Weight(lbs): 162 ?Blood Pressure(mmHg): 136/82 ?Body Mass Index(BMI): 29.6 ?Temperatur

## 2021-08-01 DIAGNOSIS — N200 Calculus of kidney: Secondary | ICD-10-CM | POA: Diagnosis not present

## 2021-08-02 ENCOUNTER — Encounter (HOSPITAL_BASED_OUTPATIENT_CLINIC_OR_DEPARTMENT_OTHER): Payer: Medicare Other | Admitting: Internal Medicine

## 2021-08-02 DIAGNOSIS — L97822 Non-pressure chronic ulcer of other part of left lower leg with fat layer exposed: Secondary | ICD-10-CM | POA: Diagnosis not present

## 2021-08-02 DIAGNOSIS — I87312 Chronic venous hypertension (idiopathic) with ulcer of left lower extremity: Secondary | ICD-10-CM | POA: Diagnosis not present

## 2021-08-02 NOTE — Progress Notes (Signed)
Uemura Daesha C. ([id number]) ?, ?Visit Report for 08/02/2021 ?Chief Complaint Document Details ?Patient Name: Date of Service: ?Toni Amend 08/02/2021 12:30 PM ?Medical Record Number: [id number] ?Patient Account Number: [id number] ?Date of Birth/Sex: Treating RN: ?01-18-1951 (71 y.o. Roel Cluck ?Primary Care Provider: Maurie Boettcher Other Clinician: ?Referring Provider: ?Treating Provider/Extender: Geralyn Corwin ?Maurie Boettcher ?Weeks in Treatment: 6 ?Information Obtained from: Patient ?Chief Complaint ?Left lower extremity wound ?Electronic Signature(s) ?Signed: 08/02/2021 1:20:48 PM By: Geralyn Corwin DO ?Entered By: Geralyn Corwin on 08/02/2021 13:10:24 ?-------------------------------------------------------------------------------- ?HPI Details ?Patient Name: Date of Service: ?Toni Amend 08/02/2021 12:30 PM ?Medical Record Number: [id number] ?Patient Account Number: [id number] ?Date of Birth/Sex: Treating RN: ?04-01-1951 (71 y.o. Roel Cluck ?Primary Care Provider: Maurie Boettcher Other Clinician: ?Referring Provider: ?Treating Provider/Extender: Geralyn Corwin ?Maurie Boettcher ?Weeks in Treatment: 6 ?History of Present Illness ?HPI Description: Admission 06/18/2021 ?Ms. Bessie Fritzsche is a 71 year old female with a past medical history of chronic venous insufficiency that presents to the clinic for a 78-month history of ?nonhealing wound to her left lower extremity. She states this happened when she had a dishwasher door. She has been following with Dr. Lequita Halt at [place] ?[place] for this issue. She started with calcium alginate and Adaptic and now is currently using Medihoney. She reports tenderness to the wound site. She ?denies systemic signs of infection. She has compression stockings but does not wear these daily. ?2/20; patient presents for follow-up. She has no issues or complaints today. She denies signs of infection. She tolerated the  compression wrap well. ?2/27; patient presents for follow-up. She tolerated the 3 layer compression wrap well. She has no issues or complaints today. ?3/7; patient presents for follow-up. She has no issues or complaints today. ?3/23; patient presents for follow-up. She states she followed up with [facility] and she is scheduled to have ablations done in April to her ?lower extremities. The 3 layer compression wrap was taken off at that visit and she has been using her compression stocking. She reports improvement in ?wound healing. She has no issues or complaints today. ?3/30; patient presents for follow-up. She has been using Hydrofera Blue with her compression stockings daily. She has no issues or complaints today. ?Electronic Signature(s) ?Signed: 08/02/2021 1:20:48 PM By: Geralyn Corwin DO ?Entered By: Geralyn Corwin on 08/02/2021 13:14:53 ?-------------------------------------------------------------------------------- ?Physical Exam Details ?Patient Name: Date of Service: ?Toni Amend 08/02/2021 12:30 PM ?Medical Record Number: [id number] ?Patient Account Number: [id number] ?Date of Birth/Sex: Treating RN: ?[birth year] (71 y.o. Roel Cluck ?Primary Care Provider: Maurie Boettcher Other Clinician: ?Referring Provider: ?Treating Provider/Extender: Geralyn Corwin ?Maurie Boettcher ?Weeks in Treatment: 6 ?Constitutional ?respirations regular, non-labored and within target range for patient.Marland Kitchen ?Cardiovascular ?2+ dorsalis pedis/posterior tibialis pulses. ?Psychiatric ?pleasant and cooperative. ?Notes ?Left lower extremity: T the anterior aspect there is a scab to the previous wound site. This was easily removed and the area underneath with completely ?o ?epithelialized. ?Electronic Signature(s) ?Signed: 08/02/2021 1:20:48 PM By: Geralyn Corwin DO ?Entered By: Geralyn Corwin on 08/02/2021  13:15:25 ?-------------------------------------------------------------------------------- ?Physician Orders Details ?Patient Name: Date of Service: ?Toni Amend 08/02/2021 12:30 PM ?Medical Record Number: [id number] ?Patient Account Number: [id number] ?Date of Birth/Sex: Treating RN: ?11/19/1950 (70 y.o. Roel Cluck ?Primary Care Provider: Maurie Boettcher Other Clinician: ?Referring Provider: ?Treating Provider/Extender: Geralyn Corwin ?Maurie Boettcher ?Weeks in Treatment: 6 ?Verbal / Phone Orders: No ?Diagnosis Coding ?Discharge From [facility] Services ?Discharge from [facility] - No further follow up  needed, call if anything changes. ?Edema Control - Lymphedema / SCD / Other ?Patient to wear own compression stockings every day. - Apply compression stockings first thing in the morning and remove at bedtime. ?Moisturize legs daily. ?Electronic Signature(s) ?Signed: 08/02/2021 1:20:48 PM By: Geralyn Corwin DO ?Entered By: Geralyn Corwin on 08/02/2021 13:15:36 ?-------------------------------------------------------------------------------- ?Problem List Details ?Patient Name: Date of Service: ?Toni Amend 08/02/2021 12:30 PM ?Medical Record Number: [id number] ?Patient Account Number: [id number] ?Date of Birth/Sex: Treating RN: ?1950/10/30 (71 y.o. Roel Cluck ?Primary Care Provider: Maurie Boettcher Other Clinician: ?Referring Provider: ?Treating Provider/Extender: Geralyn Corwin ?Maurie Boettcher ?Weeks in Treatment: 6 ?Active Problems ?ICD-10 ?Encounter ?Code Description Active Date MDM ?Diagnosis ?W09.811 Chronic venous hypertension (idiopathic) with ulcer of left lower extremity 06/18/2021 No Yes ?B14.782 Non-pressure chronic ulcer of other part of left lower leg with fat layer exposed2/13/2023 No Yes ?Inactive Problems ?Resolved Problems ?Electronic Signature(s) ?Signed: 08/02/2021 1:20:48 PM By: Geralyn Corwin DO ?Entered By: Geralyn Corwin on 08/02/2021  13:10:03 ?-------------------------------------------------------------------------------- ?Progress Note Details ?Patient Name: Date of Service: ?Toni Amend 08/02/2021 12:30 PM ?Medical Record Number: [id number] ?Patient Account Number: [id number] ?Date of Birth/Sex: Treating RN: ?Apr 04, 1951 (71 y.o. Roel Cluck ?Primary Care Provider: Maurie Boettcher Other Clinician: ?Referring Provider: ?Treating Provider/Extender: Geralyn Corwin ?Maurie Boettcher ?Weeks in Treatment: 6 ?Subjective ?Chief Complaint ?Information obtained from Patient ?Left lower extremity wound ?History of Present Illness (HPI) ?Admission 06/18/2021 ?Ms. Aalivia Busey is a 71 year old female with a past medical history of chronic venous insufficiency that presents to the clinic for a 57-month history of ?nonhealing wound to her left lower extremity. She states this happened when she had a dishwasher door. She has been following with Dr. Lequita Halt at [place] ?[place] for this issue. She started with calcium alginate and Adaptic and now is currently using Medihoney. She reports tenderness to the wound site. She ?denies systemic signs of infection. She has compression stockings but does not wear these daily. ?2/20; patient presents for follow-up. She has no issues or complaints today. She denies signs of infection. She tolerated the compression wrap well. ?2/27; patient presents for follow-up. She tolerated the 3 layer compression wrap well. She has no issues or complaints today. ?3/7; patient presents for follow-up. She has no issues or complaints today. ?3/23; patient presents for follow-up. She states she followed up with [facility] and she is scheduled to have ablations done in April to her ?lower extremities. The 3 layer compression wrap was taken off at that visit and she has been using her compression stocking. She reports improvement in ?wound healing. She has no issues or complaints today. ?3/30; patient  presents for follow-up. She has been using Hydrofera Blue with her compression stockings daily. She has no issues or complaints today. ?Patient History ?Information obtained from Patient. ?Family History ?Unknown History. ?Social History ?Never smoker, Marital Status - Married, Alcohol Use - Never, Drug U

## 2021-08-02 NOTE — Progress Notes (Signed)
Almas Venera C. ([id number]) ?, ?Visit Report for 08/02/2021 ?Arrival Information Details ?Patient Name: Date of Service: ?Toni Amend 08/02/2021 12:30 PM ?Medical Record Number: [id number] ?Patient Account Number: [id number] ?Date of Birth/Sex: Treating RN: ?Jan 27, 1951 (71 y.o. Roel Cluck ?Primary Care Garima Chronis: Maurie Boettcher Other Clinician: ?Referring Jayci Ellefson: ?Treating Der Gagliano/Extender: Geralyn Corwin ?Maurie Boettcher ?Weeks in Treatment: 6 ?Visit Information History Since Last Visit ?Added or deleted any medications: No ?Patient Arrived: Ambulatory ?Any new allergies or adverse reactions: No ?Arrival Time: 12:39 ?Had a fall or experienced change in No ?Transfer Assistance: None ?activities of daily living that may affect ?Patient Identification Verified: Yes ?risk of falls: ?Secondary Verification Process Completed: Yes ?Signs or symptoms of abuse/neglect since last visito No ?Patient Requires Transmission-Based Precautions: No ?Hospitalized since last visit: No ?Patient Has Alerts: No ?Implantable device outside of the clinic excluding No ?cellular tissue based products placed in the center ?since last visit: ?Has Dressing in Place as Prescribed: Yes ?Pain Present Now: No ?Electronic Signature(s) ?Signed: 08/02/2021 4:12:07 PM By: Antonieta Iba ?Entered By: Antonieta Iba on 08/02/2021 12:40:58 ?-------------------------------------------------------------------------------- ?Clinic Level of Care Assessment Details ?Patient Name: Date of Service: ?Toni Amend 08/02/2021 12:30 PM ?Medical Record Number: [id number] ?Patient Account Number: [id number] ?Date of Birth/Sex: Treating RN: ?Nov 04, 1950 (71 y.o. Roel Cluck ?Primary Care Zyanya Glaza: Maurie Boettcher Other Clinician: ?Referring Jayra Choyce: ?Treating Shizue Kaseman/Extender: Geralyn Corwin ?Maurie Boettcher ?Weeks in Treatment: 6 ?Clinic Level of Care Assessment Items ?TOOL 4 Quantity Score ?X- 1 0 ?Use when only an  EandM is performed on FOLLOW-UP visit ?ASSESSMENTS - Nursing Assessment / Reassessment ?X- 1 10 ?Reassessment of Co-morbidities (includes updates in patient status) ?X- 1 5 ?Reassessment of Adherence to Treatment Plan ?ASSESSMENTS - Wound and Skin A ssessment / Reassessment ?X - Simple Wound Assessment / Reassessment - one wound 1 5 ?  - 0 ?Complex Wound Assessment / Reassessment - multiple wounds ?  - 0 ?Dermatologic / Skin Assessment (not related to wound area) ?ASSESSMENTS - Focused Assessment ?  - 0 ?Circumferential Edema Measurements - multi extremities ?  - 0 ?Nutritional Assessment / Counseling / Intervention ?  - 0 ?Lower Extremity Assessment (monofilament, tuning fork, pulses) ?  - 0 ?Peripheral Arterial Disease Assessment (using hand held doppler) ?ASSESSMENTS - Ostomy and/or Continence Assessment and Care ?  - 0 ?Incontinence Assessment and Management ?  - 0 ?Ostomy Care Assessment and Management (repouching, etc.) ?PROCESS - Coordination of Care ?  - 0 ?Simple Patient / Family Education for ongoing care ?  - 0 ?Complex (extensive) Patient / Family Education for ongoing care ?  - 0 ?Staff obtains Consents, Records, T Results / Process Orders ?est ?  - 0 ?Staff telephones HHA, Nursing Homes / Clarify orders / etc ?  - 0 ?Routine Transfer to another Facility (non-emergent condition) ?  - 0 ?Routine Hospital Admission (non-emergent condition) ?  - 0 ?New Admissions / Manufacturing engineer / Ordering NPWT Apligraf, etc. ?, ?  - 0 ?Emergency Hospital Admission (emergent condition) ?  - 0 ?Simple Discharge Coordination ?  - 0 ?Complex (extensive) Discharge Coordination ?PROCESS - Special Needs ?  - 0 ?Pediatric / Minor Patient Management ?  - 0 ?Isolation Patient Management ?  - 0 ?Hearing / Language / Visual special needs ?  - 0 ?Assessment of Community assistance (transportation, D/C planning, etc.) ?  - 0 ?Additional assistance / Altered mentation ?  - 0 ?Support Surface(s)  Assessment (bed, cushion, seat, etc.) ?INTERVENTIONS - Wound Cleansing / Measurement ?  - 0 ?Simple Wound Cleansing - one wound ?  - 0 ?Complex Wound Cleansing - multiple wounds ?X- 1 5 ?Wound  Imaging (photographs - any number of wounds) ?[]  - 0 ?Wound Tracing (instead of photographs) ?[]  - 0 ?Simple Wound Measurement - one wound ?[]  - 0 ?Complex Wound Measurement - multiple wounds ?INTERVENTIONS - Wound Dressings ?X - Small Wound Dressing one or multiple wounds 1 10 ?[]  - 0 ?Medium Wound Dressing one or multiple wounds ?[]  - 0 ?Large Wound Dressing one or multiple wounds ?[]  - 0 ?Application of Medications - topical ?[]  - 0 ?Application of Medications - injection ?INTERVENTIONS - Miscellaneous ?[]  - 0 ?External ear exam ?[]  - 0 ?Specimen Collection (cultures, biopsies, blood, body fluids, etc.) ?[]  - 0 ?Specimen(s) / Culture(s) sent or taken to Lab for analysis ?[]  - 0 ?Patient Transfer (multiple staff / Nurse, adult / Similar devices) ?[]  - 0 ?Simple Staple / Suture removal (25 or less) ?[]  - 0 ?Complex Staple / Suture removal (26 or more) ?[]  - 0 ?Hypo / Hyperglycemic Management (close monitor of Blood Glucose) ?[]  - 0 ?Ankle / Brachial Index (ABI) - do not check if billed separately ?X- 1 5 ?Vital Signs ?Has the patient been seen at the hospital within the last three years: Yes ?Total Score: 40 ?Level Of Care: New/Established - Level 2 ?Electronic Signature(s) ?Signed: 08/02/2021 4:12:07 PM By: Antonieta Iba ?Entered By: Antonieta Iba on 08/02/2021 12:56:06 ?-------------------------------------------------------------------------------- ?Encounter Discharge Information Details ?Patient Name: Date of Service: ?Toni Amend 08/02/2021 12:30 PM ?Medical Record Number: [id number] ?Patient Account Number: [id number] ?Date of Birth/Sex: Treating RN: ?Mar 04, 1951 (71 y.o. Roel Cluck ?Primary Care Jaxyn Rout: Maurie Boettcher Other Clinician: ?Referring Crystalynn Mcinerney: ?Treating Laiken Sandy/Extender: Geralyn Corwin ?Maurie Boettcher ?Weeks in Treatment: 6 ?Encounter Discharge Information Items ?Discharge Condition: Stable ?Ambulatory Status: Ambulatory ?Discharge Destination: Home ?Transportation: Private Auto ?Schedule Follow-up Appointment: No ?Clinical Summary of Care: Provided on 08/02/2021 ?Form Type Recipient ?Paper Patient Patient ?Electronic Signature(s) ?Signed: 08/02/2021 4:12:07 PM By: Antonieta Iba ?Entered By: Antonieta Iba on 08/02/2021 12:59:07 ?-------------------------------------------------------------------------------- ?Lower Extremity Assessment Details ?Patient Name: Date of Service: ?Toni Amend 08/02/2021 12:30 PM ?Medical Record Number: [id number] ?Patient Account Number: [id number] ?Date of Birth/Sex: Treating RN: ?1950/11/14 (71 y.o. Roel Cluck ?Primary Care Tran Randle: Maurie Boettcher Other Clinician: ?Referring Zacari Radick: ?Treating Larena Ohnemus/Extender: Geralyn Corwin ?Maurie Boettcher ?Weeks in Treatment: 6 ?Edema Assessment ?Assessed: [Left: Yes] [Right: No] ?Edema: [Left: Ye] [Right: s] ?Calf ?Left: Right: ?Point of Measurement: 31 cm From Medial Instep 33 cm ?Ankle ?Left: Right: ?Point of Measurement: 11 cm From Medial Instep 21 cm ?Vascular Assessment ?Pulses: ?Dorsalis Pedis ?Palpable: [Left:Yes] ?Electronic Signature(s) ?Signed: 08/02/2021 4:12:07 PM By: Antonieta Iba ?Entered By: Antonieta Iba on 08/02/2021 12:44:34 ?-------------------------------------------------------------------------------- ?Multi Wound Chart Details ?Patient Name: ?Date of Service: ?Tacey Ruiz [name] 08/02/2021 12:30 PM ?Medical Record Number: [id number] ?Patient Account Number: [id number] ?Date of Birth/Sex: ?Treating RN: ?1950/05/25 (71 y.o. Roel Cluck ?Primary Care Jamonica Schoff: Maurie Boettcher ?Other Clinician: ?Referring Filip Luten: ?Treating Jane Broughton/Extender: Geralyn Corwin ?Maurie Boettcher ?Weeks in Treatment: 6 ?Vital Signs ?Height(in): 62 ?Pulse(bpm): 69 ?Weight(lbs):  162 ?Blood Pressure(mmHg): 121/77 ?Body Mass Index(BMI): 29.6 ?Temperature(??F): 97.8 ?Respiratory Rate(breaths/min): 16 ?Photos: [N/A:N/A] ?Left, Anterior Lower Leg N/A N/A ?Wound Location: ?Gradually Appeared

## 2021-08-08 DIAGNOSIS — I8312 Varicose veins of left lower extremity with inflammation: Secondary | ICD-10-CM | POA: Diagnosis not present

## 2021-08-08 DIAGNOSIS — I8311 Varicose veins of right lower extremity with inflammation: Secondary | ICD-10-CM | POA: Diagnosis not present

## 2021-08-22 DIAGNOSIS — N8111 Cystocele, midline: Secondary | ICD-10-CM | POA: Diagnosis not present

## 2021-08-24 DIAGNOSIS — N812 Incomplete uterovaginal prolapse: Secondary | ICD-10-CM | POA: Diagnosis not present

## 2021-08-31 DIAGNOSIS — M545 Low back pain, unspecified: Secondary | ICD-10-CM | POA: Diagnosis not present

## 2021-08-31 DIAGNOSIS — M7918 Myalgia, other site: Secondary | ICD-10-CM | POA: Diagnosis not present

## 2021-08-31 DIAGNOSIS — M5106 Intervertebral disc disorders with myelopathy, lumbar region: Secondary | ICD-10-CM | POA: Diagnosis not present

## 2021-08-31 DIAGNOSIS — G894 Chronic pain syndrome: Secondary | ICD-10-CM | POA: Diagnosis not present

## 2021-09-06 DIAGNOSIS — N812 Incomplete uterovaginal prolapse: Secondary | ICD-10-CM | POA: Diagnosis not present

## 2021-09-06 DIAGNOSIS — Z4689 Encounter for fitting and adjustment of other specified devices: Secondary | ICD-10-CM | POA: Diagnosis not present

## 2021-09-19 ENCOUNTER — Ambulatory Visit: Payer: Medicare Other | Admitting: Cardiology

## 2021-09-23 DIAGNOSIS — J302 Other seasonal allergic rhinitis: Secondary | ICD-10-CM | POA: Diagnosis not present

## 2021-10-04 DIAGNOSIS — N812 Incomplete uterovaginal prolapse: Secondary | ICD-10-CM | POA: Diagnosis not present

## 2021-10-05 DIAGNOSIS — N200 Calculus of kidney: Secondary | ICD-10-CM | POA: Diagnosis not present

## 2021-10-05 DIAGNOSIS — I878 Other specified disorders of veins: Secondary | ICD-10-CM | POA: Diagnosis not present

## 2021-10-05 DIAGNOSIS — M419 Scoliosis, unspecified: Secondary | ICD-10-CM | POA: Diagnosis not present

## 2021-10-05 DIAGNOSIS — Z87442 Personal history of urinary calculi: Secondary | ICD-10-CM | POA: Diagnosis not present

## 2021-10-05 DIAGNOSIS — M958 Other specified acquired deformities of musculoskeletal system: Secondary | ICD-10-CM | POA: Diagnosis not present

## 2021-10-09 DIAGNOSIS — N898 Other specified noninflammatory disorders of vagina: Secondary | ICD-10-CM | POA: Diagnosis not present

## 2021-10-09 DIAGNOSIS — N812 Incomplete uterovaginal prolapse: Secondary | ICD-10-CM | POA: Diagnosis not present

## 2021-10-11 DIAGNOSIS — H2513 Age-related nuclear cataract, bilateral: Secondary | ICD-10-CM | POA: Diagnosis not present

## 2021-10-11 DIAGNOSIS — H16223 Keratoconjunctivitis sicca, not specified as Sjogren's, bilateral: Secondary | ICD-10-CM | POA: Diagnosis not present

## 2021-10-23 DIAGNOSIS — R112 Nausea with vomiting, unspecified: Secondary | ICD-10-CM | POA: Diagnosis not present

## 2021-10-23 DIAGNOSIS — R42 Dizziness and giddiness: Secondary | ICD-10-CM | POA: Diagnosis not present

## 2021-10-23 DIAGNOSIS — H81399 Other peripheral vertigo, unspecified ear: Secondary | ICD-10-CM | POA: Diagnosis not present

## 2021-11-22 DIAGNOSIS — Z8742 Personal history of other diseases of the female genital tract: Secondary | ICD-10-CM | POA: Insufficient documentation

## 2021-11-22 HISTORY — DX: Personal history of other diseases of the female genital tract: Z87.42

## 2021-11-28 NOTE — Progress Notes (Signed)
Cardiology Office Note:    Date:  11/29/2021   ID:  Molly Hogan, DOB 1950/07/18, MRN [id number]  PCP:  Ninfa Meeker, FNP (Inactive)  Cardiologist:  Norman Herrlich, MD    Referring MD: Vertell Novak*    ASSESSMENT:    1. Leg swelling   2. Shortness of breath   3. Paroxysmal atrial tachycardia (HCC)   4. PAT (paroxysmal atrial tachycardia) (HCC)    PLAN:    In order of problems listed above:  Overall she has done well continue her low-dose loop diuretic and told her she needs 6 she could take an extra tablet of furosemide as needed we will check renal function in my office tomorrow Stable no severe symptoms I asked her to activate the oxygen sat monitor Apple watch with a goal saturation of 93 or greater and concern if 90 or less. Stable no recurrent rapid heart rhythms currently not on beta-blocker or antiarrhythmic drug   Next appointment: 1 year   Medication Adjustments/Labs and Tests Ordered: Current medicines are reviewed at length with the patient today.  Concerns regarding medicines are outlined above.  No orders of the defined types were placed in this encounter.  No orders of the defined types were placed in this encounter.   Follow-up with lower extremity edema and shortness of breath   History of Present Illness:    Molly Hogan is a 71 y.o. female with a hx of DVT with chronic lower extremity edema atrial arrhythmia paroxysmal atrial tachycardia and shortness of breath last seen 03/09/2021 she was seen by pulmonary and found to have mild obstructive sleep apnea with desaturations.  Compliance with diet, lifestyle and medications: Yes  Low-dose furosemide is controlled her chronic lower extremity edema At times she finds herself a bit breathless she has an Scientist, physiological and I told her she can activate the oxygen sat monitor to trend her oxygen saturations. She does not have orthopnea or exertional dyspnea No episodes of rapid heart  rhythm and she also activate the EKG monitoring of her watch with a history of previous atrial tachycardia No chest pain or syncope  She had an echocardiogram performed 03/20/2021 following her visit left ventricle with mild concentric LVH normal ejection fraction 60 to 65% normal left ventricular systolic and diastolic function.  Right ventricle normal size function and pulmonary artery pressure.  There is mild regurgitation noted.  1. Left ventricular ejection fraction, by estimation, is 60 to 65%. The  left ventricle has normal function. The left ventricle has no regional  wall motion abnormalities. There is mild left ventricular hypertrophy.  Left ventricular diastolic parameters  were normal.   2. Right ventricular systolic function is normal. The right ventricular  size is normal. There is normal pulmonary artery systolic pressure.   3. The mitral valve is normal in structure. Mild mitral valve  regurgitation. No evidence of mitral stenosis.   4. The aortic valve is normal in structure. Aortic valve regurgitation is  not visualized. No aortic stenosis is present.   5. The inferior vena cava is normal in size with greater than 50%  respiratory variability, suggesting right atrial pressure of 3 mmHg.  Past Medical History:  Diagnosis Date   Acquired toe deformity, right 07/29/2018   Arthritis, midfoot 07/29/2018   Barrett esophagus    Class 1 obesity    Great toe pain, right 07/29/2018   History of abnormal cervical Papanicolaou smear 11/22/2021   Leg swelling 12/23/2018   Multiple  thyroid nodules 07/08/2011   Osteopenia    Paroxysmal atrial tachycardia (HCC) 03/09/2021   Partial small bowel obstruction (HCC) 02/09/2019   Pneumonia    Post-cholecystectomy syndrome    Postoperative examination 02/09/2019   Postoperative nausea 02/09/2019   Recurrent nephrolithiasis    Shortness of breath 12/23/2018   Stasis edema with inflammation, bilateral    Thyroid nodule    Vitamin D deficiency      Past Surgical History:  Procedure Laterality Date   ABDOMINAL HYSTERECTOMY  1992   ANKLE FRACTURE SURGERY  2000   CHOLECYSTECTOMY     GALLBLADDER SURGERY  2005   gastric volulus  2012   LITHOTRIPSY     MENISCUS REPAIR  2013   NASAL SINUS SURGERY  11/2020   TONSILLECTOMY  1972   TOTAL KNEE ARTHROPLASTY Left     Current Medications: Current Meds  Medication Sig   aspirin EC 81 MG tablet Take 81 mg by mouth daily.   baclofen (LIORESAL) 10 MG tablet Take 10 mg by mouth 3 (three) times daily.   doxylamine, Sleep, (SLEEP AID) 25 MG tablet Take 25 mg by mouth at bedtime as needed for sleep.   estradiol (ESTRACE) 2 MG tablet Take 2 mg by mouth 3 (three) times a week. MON - WED - FRI   furosemide (LASIX) 20 MG tablet Take 20 mg by mouth daily.   meloxicam (MOBIC) 15 MG tablet TAKE 1 TABLET BY MOUTH ONCE (1) DAILY WITH FOOD   Multiple Vitamin (MULTIVITAMIN PO) Take 1 tablet by mouth daily.   omeprazole (PRILOSEC) 20 MG capsule Take 20 mg by mouth every morning.   polyethylene glycol (MIRALAX / GLYCOLAX) 17 g packet Take 17 g by mouth daily.   tamsulosin (FLOMAX) 0.4 MG CAPS capsule Take 0.4 mg by mouth daily.   vitamin C (ASCORBIC ACID) 500 MG tablet Take 500 mg by mouth daily.   Vitamin D, Ergocalciferol, (DRISDOL) 1.25 MG (50000 UT) CAPS capsule TAKE ONE CAPSULE BY MOUTH TWICE WEEKLY     Allergies:   Patient has no known allergies.   Social History   Socioeconomic History   Marital status: Married    Spouse name: Not on file   Number of children: Not on file   Years of education: Not on file   Highest education level: Not on file  Occupational History   Not on file  Tobacco Use   Smoking status: Never   Smokeless tobacco: Never  Vaping Use   Vaping Use: Never used  Substance and Sexual Activity   Alcohol use: No   Drug use: No   Sexual activity: Not on file  Other Topics Concern   Not on file  Social History Narrative   Not on file   Social Determinants of  Health   Financial Resource Strain: Not on file  Food Insecurity: Not on file  Transportation Needs: Not on file  Physical Activity: Not on file  Stress: Not on file  Social Connections: Not on file     Family History: The patient's family history includes AAA (abdominal aortic aneurysm) in her mother; Cancer in her mother; Heart disease in her mother. ROS:   Please see the history of present illness.    All other systems reviewed and are negative.  EKGs/Labs/Other Studies Reviewed:    The following studies were reviewed today:  EKG:  EKG ordered today and personally reviewed.  The ekg ordered today demonstrates sinus rhythm normal EKG  Recent Labs: 03/09/2021: BUN 21; Creatinine,  Ser 0.72; NT-Pro BNP 91; Potassium 4.0; Sodium 142  Recent Lipid Panel No results found for: "CHOL", "TRIG", "HDL", "CHOLHDL", "VLDL", "LDLCALC", "LDLDIRECT"  Physical Exam:    VS:  BP 98/60   Pulse 82   Ht 5\' 1"  (1.549 m)   Wt 175 lb 0.6 oz (79.4 kg)   SpO2 96%   BMI 33.07 kg/m     Wt Readings from Last 3 Encounters:  11/29/21 175 lb 0.6 oz (79.4 kg)  03/09/21 171 lb (77.6 kg)  01/03/21 185 lb 6.4 oz (84.1 kg)     GEN:  Well nourished, well developed in no acute distress HEENT: Normal NECK: No JVD; No carotid bruits LYMPHATICS: No lymphadenopathy CARDIAC: RRR, no murmurs, rubs, gallops RESPIRATORY:  Clear to auscultation without rales, wheezing or rhonchi  ABDOMEN: Soft, non-tender, non-distended MUSCULOSKELETAL:  No edema; No deformity  SKIN: Warm and dry NEUROLOGIC:  Alert and oriented x 3 PSYCHIATRIC:  Normal affect    Signed, Norman Herrlich, MD  11/29/2021 3:52 PM    [facility]

## 2021-11-29 ENCOUNTER — Encounter: Payer: Self-pay | Admitting: Cardiology

## 2021-11-29 ENCOUNTER — Ambulatory Visit (INDEPENDENT_AMBULATORY_CARE_PROVIDER_SITE_OTHER): Payer: Medicare Other | Admitting: Cardiology

## 2021-11-29 VITALS — BP 98/60 | HR 82 | Ht 61.0 in | Wt 175.0 lb

## 2021-11-29 DIAGNOSIS — R0602 Shortness of breath: Secondary | ICD-10-CM

## 2021-11-29 DIAGNOSIS — I471 Supraventricular tachycardia: Secondary | ICD-10-CM | POA: Diagnosis not present

## 2021-11-29 DIAGNOSIS — M7989 Other specified soft tissue disorders: Secondary | ICD-10-CM | POA: Diagnosis not present

## 2021-11-29 NOTE — Patient Instructions (Signed)
Medication Instructions:  Your physician recommends that you continue on your current medications as directed. Please refer to the Current Medication list given to you today.  *If you need a refill on your cardiac medications before your next appointment, please call your pharmacy*   Lab Work: Your physician recommends that you return for lab work in:   Labs tomorrow: BMP  If you have labs (blood work) drawn today and your tests are completely normal, you will receive your results only by: MyChart Message (if you have MyChart) OR A paper copy in the mail If you have any lab test that is abnormal or we need to change your treatment, we will call you to review the results.   Testing/Procedures: None   Follow-Up: At [facility], you and your health needs are our priority.  As part of our continuing mission to provide you with exceptional heart care, we have created designated Provider Care Teams.  These Care Teams include your primary Cardiologist (physician) and Advanced Practice Providers (APPs -  Physician Assistants and Nurse Practitioners) who all work together to provide you with the care you need, when you need it.  We recommend signing up for the patient portal called "MyChart".  Sign up information is provided on this After Visit Summary.  MyChart is used to connect with patients for Virtual Visits (Telemedicine).  Patients are able to view lab/test results, encounter notes, upcoming appointments, etc.  Non-urgent messages can be sent to your provider as well.   To learn more about what you can do with MyChart, go to [email or web address].    Your next appointment:   1 year(s)  The format for your next appointment:   In Person  Provider:   Norman Herrlich, MD    Other Instructions Set-up Apple watch for heart rate monitoring Sign-up with MyChart Dermasil skin moisturizer Turn on and monitor oxygen  Important Information About Sugar

## 2021-11-30 DIAGNOSIS — Z4689 Encounter for fitting and adjustment of other specified devices: Secondary | ICD-10-CM | POA: Diagnosis not present

## 2021-11-30 DIAGNOSIS — M5106 Intervertebral disc disorders with myelopathy, lumbar region: Secondary | ICD-10-CM | POA: Diagnosis not present

## 2021-11-30 DIAGNOSIS — M7918 Myalgia, other site: Secondary | ICD-10-CM | POA: Diagnosis not present

## 2021-11-30 DIAGNOSIS — N812 Incomplete uterovaginal prolapse: Secondary | ICD-10-CM | POA: Diagnosis not present

## 2021-11-30 DIAGNOSIS — G894 Chronic pain syndrome: Secondary | ICD-10-CM | POA: Diagnosis not present

## 2021-12-07 DIAGNOSIS — M7989 Other specified soft tissue disorders: Secondary | ICD-10-CM | POA: Diagnosis not present

## 2021-12-07 DIAGNOSIS — R0602 Shortness of breath: Secondary | ICD-10-CM | POA: Diagnosis not present

## 2021-12-07 DIAGNOSIS — I471 Supraventricular tachycardia: Secondary | ICD-10-CM | POA: Diagnosis not present

## 2021-12-08 LAB — BASIC METABOLIC PANEL
BUN/Creatinine Ratio: 27 (ref 12–28)
BUN: 18 mg/dL (ref 8–27)
CO2: 26 mmol/L (ref 20–29)
Calcium: 9.4 mg/dL (ref 8.7–10.3)
Chloride: 105 mmol/L (ref 96–106)
Creatinine, Ser: 0.67 mg/dL (ref 0.57–1.00)
Glucose: 81 mg/dL (ref 70–99)
Potassium: 4.1 mmol/L (ref 3.5–5.2)
Sodium: 142 mmol/L (ref 134–144)
eGFR: 94 mL/min/{1.73_m2} (ref >59)

## 2021-12-11 ENCOUNTER — Telehealth: Payer: Self-pay | Admitting: Cardiology

## 2021-12-11 NOTE — Telephone Encounter (Signed)
Pt returning nurses call regarding results. Please advise 

## 2021-12-11 NOTE — Telephone Encounter (Signed)
Patient informed of results.  

## 2021-12-14 DIAGNOSIS — N812 Incomplete uterovaginal prolapse: Secondary | ICD-10-CM | POA: Diagnosis not present

## 2021-12-28 DIAGNOSIS — N819 Female genital prolapse, unspecified: Secondary | ICD-10-CM | POA: Diagnosis not present

## 2022-02-27 DIAGNOSIS — N8111 Cystocele, midline: Secondary | ICD-10-CM | POA: Diagnosis not present

## 2022-03-05 DIAGNOSIS — I87323 Chronic venous hypertension (idiopathic) with inflammation of bilateral lower extremity: Secondary | ICD-10-CM | POA: Diagnosis not present

## 2022-03-05 DIAGNOSIS — Z6832 Body mass index (BMI) 32.0-32.9, adult: Secondary | ICD-10-CM | POA: Diagnosis not present

## 2022-03-05 DIAGNOSIS — L03119 Cellulitis of unspecified part of limb: Secondary | ICD-10-CM | POA: Diagnosis not present

## 2022-03-08 DIAGNOSIS — M5106 Intervertebral disc disorders with myelopathy, lumbar region: Secondary | ICD-10-CM | POA: Diagnosis not present

## 2022-03-08 DIAGNOSIS — M7918 Myalgia, other site: Secondary | ICD-10-CM | POA: Diagnosis not present

## 2022-03-08 DIAGNOSIS — Z6836 Body mass index (BMI) 36.0-36.9, adult: Secondary | ICD-10-CM | POA: Diagnosis not present

## 2022-03-31 DIAGNOSIS — S46912A Strain of unspecified muscle, fascia and tendon at shoulder and upper arm level, left arm, initial encounter: Secondary | ICD-10-CM | POA: Diagnosis not present

## 2022-04-05 DIAGNOSIS — M79601 Pain in right arm: Secondary | ICD-10-CM | POA: Diagnosis not present

## 2022-04-05 DIAGNOSIS — M8580 Other specified disorders of bone density and structure, unspecified site: Secondary | ICD-10-CM | POA: Diagnosis not present

## 2022-04-05 DIAGNOSIS — M25511 Pain in right shoulder: Secondary | ICD-10-CM | POA: Diagnosis not present

## 2022-04-05 DIAGNOSIS — Z6832 Body mass index (BMI) 32.0-32.9, adult: Secondary | ICD-10-CM | POA: Diagnosis not present

## 2022-04-15 DIAGNOSIS — M25511 Pain in right shoulder: Secondary | ICD-10-CM | POA: Diagnosis not present

## 2022-04-15 DIAGNOSIS — S46001A Unspecified injury of muscle(s) and tendon(s) of the rotator cuff of right shoulder, initial encounter: Secondary | ICD-10-CM | POA: Diagnosis not present

## 2022-04-18 DIAGNOSIS — K2270 Barrett's esophagus without dysplasia: Secondary | ICD-10-CM | POA: Diagnosis not present

## 2022-04-18 DIAGNOSIS — K219 Gastro-esophageal reflux disease without esophagitis: Secondary | ICD-10-CM | POA: Diagnosis not present

## 2022-04-18 DIAGNOSIS — Z23 Encounter for immunization: Secondary | ICD-10-CM | POA: Diagnosis not present

## 2022-05-07 DIAGNOSIS — S46001A Unspecified injury of muscle(s) and tendon(s) of the rotator cuff of right shoulder, initial encounter: Secondary | ICD-10-CM | POA: Diagnosis not present

## 2022-05-07 DIAGNOSIS — M25511 Pain in right shoulder: Secondary | ICD-10-CM | POA: Diagnosis not present

## 2022-05-17 DIAGNOSIS — Z79899 Other long term (current) drug therapy: Secondary | ICD-10-CM | POA: Diagnosis not present

## 2022-05-17 DIAGNOSIS — K6289 Other specified diseases of anus and rectum: Secondary | ICD-10-CM | POA: Diagnosis not present

## 2022-05-17 DIAGNOSIS — K2270 Barrett's esophagus without dysplasia: Secondary | ICD-10-CM | POA: Diagnosis not present

## 2022-05-17 DIAGNOSIS — Z86010 Personal history of colonic polyps: Secondary | ICD-10-CM | POA: Diagnosis not present

## 2022-05-17 DIAGNOSIS — Z8719 Personal history of other diseases of the digestive system: Secondary | ICD-10-CM | POA: Diagnosis not present

## 2022-05-17 DIAGNOSIS — K208 Other esophagitis without bleeding: Secondary | ICD-10-CM | POA: Diagnosis not present

## 2022-05-17 DIAGNOSIS — K229 Disease of esophagus, unspecified: Secondary | ICD-10-CM | POA: Diagnosis not present

## 2022-05-17 DIAGNOSIS — Z87442 Personal history of urinary calculi: Secondary | ICD-10-CM | POA: Diagnosis not present

## 2022-05-17 DIAGNOSIS — K219 Gastro-esophageal reflux disease without esophagitis: Secondary | ICD-10-CM | POA: Diagnosis not present

## 2022-05-17 DIAGNOSIS — Z7982 Long term (current) use of aspirin: Secondary | ICD-10-CM | POA: Diagnosis not present

## 2022-05-17 DIAGNOSIS — Z1211 Encounter for screening for malignant neoplasm of colon: Secondary | ICD-10-CM | POA: Diagnosis not present

## 2022-05-22 DIAGNOSIS — M75121 Complete rotator cuff tear or rupture of right shoulder, not specified as traumatic: Secondary | ICD-10-CM | POA: Diagnosis not present

## 2022-05-23 ENCOUNTER — Telehealth: Payer: Self-pay | Admitting: *Deleted

## 2022-05-23 NOTE — Telephone Encounter (Signed)
Left message for pt to call back regarding surgical clearance and the need for a tele visit.

## 2022-05-23 NOTE — Telephone Encounter (Signed)
   Name: MOANI WEIPERT  DOB: [birth year]  MRN: [id number]  Primary Cardiologist: Shirlee More, MD   Preoperative team, please contact this patient and set up a phone call appointment for further preoperative risk assessment. Please obtain consent and complete medication review. Thank you for your help.  I confirm that guidance regarding antiplatelet and oral anticoagulation therapy has been completed and, if necessary, noted below.  Patient's aspirin is not prescribed by cardiology provider.  Recommendations for holding aspirin will need to come from prescribing provider.   Deberah Pelton, NP 05/23/2022, 3:10 PM [place]

## 2022-05-23 NOTE — Telephone Encounter (Signed)
   Pre-operative Risk Assessment    Patient Name: Molly Hogan  DOB: [birth year] MRN: [id number]      Request for Surgical Clearance    Procedure:   RIGHT SHOULDER SCOPE, SAD, DCR, RCR, LAB DEBRIDEMENT, BICEPS TENOTOMY  Date of Surgery:  Clearance TBD                                 Surgeon:  Sydnee Cabal, MD Surgeon's Group or Practice Name:  [facility] Phone number:  [phone] Fax number:  [phone]   Type of Clearance Requested:   - Pharmacy:  Hold Aspirin NOT INDICATED HOW LONG   Type of Anesthesia:   GENERAL ANESTHESIA WITH INTERSCALENE BLOCK   Additional requests/questions:    [name]   05/23/2022, 2:40 PM

## 2022-05-24 ENCOUNTER — Telehealth: Payer: Self-pay

## 2022-05-24 DIAGNOSIS — D225 Melanocytic nevi of trunk: Secondary | ICD-10-CM | POA: Diagnosis not present

## 2022-05-24 DIAGNOSIS — D485 Neoplasm of uncertain behavior of skin: Secondary | ICD-10-CM | POA: Diagnosis not present

## 2022-05-24 DIAGNOSIS — D2239 Melanocytic nevi of other parts of face: Secondary | ICD-10-CM | POA: Diagnosis not present

## 2022-05-24 NOTE — Telephone Encounter (Signed)
Pt is returning call. Requesting return call.  

## 2022-05-24 NOTE — Telephone Encounter (Signed)
  Patient Consent for Virtual Visit        Molly Hogan has provided verbal consent on 05/24/2022 for a virtual visit (video or telephone).   CONSENT FOR VIRTUAL VISIT FOR:  [place]  By participating in this virtual visit I agree to the following:  I hereby voluntarily request, consent and authorize [facility] and its employed or contracted physicians, physician assistants, nurse practitioners or other licensed health care professionals (the Practitioner), to provide me with telemedicine health care services (the "Services") as deemed necessary by the treating Practitioner. I acknowledge and consent to receive the Services by the Practitioner via telemedicine. I understand that the telemedicine visit will involve communicating with the Practitioner through live audiovisual communication technology and the disclosure of certain medical information by electronic transmission. I acknowledge that I have been given the opportunity to request an in-person assessment or other available alternative prior to the telemedicine visit and am voluntarily participating in the telemedicine visit.  I understand that I have the right to withhold or withdraw my consent to the use of telemedicine in the course of my care at any time, without affecting my right to future care or treatment, and that the Practitioner or I may terminate the telemedicine visit at any time. I understand that I have the right to inspect all information obtained and/or recorded in the course of the telemedicine visit and may receive copies of available information for a reasonable fee.  I understand that some of the potential risks of receiving the Services via telemedicine include:  Delay or interruption in medical evaluation due to technological equipment failure or disruption; Information transmitted may not be sufficient (e.g. poor resolution of images) to allow for appropriate medical decision making by the  Practitioner; and/or  In rare instances, security protocols could fail, causing a breach of personal health information.  Furthermore, I acknowledge that it is my responsibility to provide information about my medical history, conditions and care that is complete and accurate to the best of my ability. I acknowledge that Practitioner's advice, recommendations, and/or decision may be based on factors not within their control, such as incomplete or inaccurate data provided by me or distortions of diagnostic images or specimens that may result from electronic transmissions. I understand that the practice of medicine is not an exact science and that Practitioner makes no warranties or guarantees regarding treatment outcomes. I acknowledge that a copy of this consent can be made available to me via my patient portal ([facility]), or I can request a printed copy by calling the office of [facility].    I understand that my insurance will be billed for this visit.   I have read or had this consent read to me. I understand the contents of this consent, which adequately explains the benefits and risks of the Services being provided via telemedicine.  I have been provided ample opportunity to ask questions regarding this consent and the Services and have had my questions answered to my satisfaction. I give my informed consent for the services to be provided through the use of telemedicine in my medical care

## 2022-05-24 NOTE — Telephone Encounter (Signed)
Spoke with patient who is agreeable to do a tele visit on 1/25 at 9 am. Med rec and consent have been done.

## 2022-05-30 ENCOUNTER — Ambulatory Visit: Payer: Medicare Other | Attending: Internal Medicine | Admitting: Physician Assistant

## 2022-05-30 DIAGNOSIS — Z01810 Encounter for preprocedural cardiovascular examination: Secondary | ICD-10-CM

## 2022-05-30 NOTE — Progress Notes (Signed)
Virtual Visit via Telephone Note   Because of Molly Hogan's co-morbid illnesses, she is at least at moderate risk for complications without adequate follow up.  This format is felt to be most appropriate for this patient at this time.  The patient did not have access to video technology/had technical difficulties with video requiring transitioning to audio format only (telephone).  All issues noted in this document were discussed and addressed.  No physical exam could be performed with this format.  Please refer to the patient's chart for her consent to telehealth for [facility].  Evaluation Performed:  Preoperative cardiovascular risk assessment _____________   Date:  05/30/2022   Patient ID:  Molly Hogan, DOB [birth year], MRN [id number] Patient Location:  Home Provider location:   Office  Primary Care Provider:  Leilani Able, FNP (Inactive) Primary Cardiologist:  Shirlee More, MD  Chief Complaint / Patient Profile   72 y.o. y/o female with a h/o leg edema, shortness of breath, paroxysmal atrial tachycardia, who is pending right shoulder scope, SAD, DCR, RCR, Lab debridement, and biceps tenotomy presents today for telephonic preoperative cardiovascular risk assessment.  History of Present Illness    Molly Hogan is a 72 y.o. female who presents via audio/video conferencing for a telehealth visit today.  Pt was last seen in cardiology clinic on 11/29/2021 by Shirlee More, MD.  At that time [facility] was doing well .  The patient is now pending procedure as outlined above. Since her last visit, she feels good from a cardiac standpoint. She has been having some GERD pain and her doctor increased her omeprazole. No SOB.   Okay to hold ASA x 7 days from a cardiac standpoint. Of note, ASA is not prescribed by cardiology and would recommend clearance from prescribing physician. No history of CAD.   Past Medical History    Past Medical  History:  Diagnosis Date   Acquired toe deformity, right 07/29/2018   Arthritis, midfoot 07/29/2018   Barrett esophagus    Class 1 obesity    Great toe pain, right 07/29/2018   History of abnormal cervical Papanicolaou smear 11/22/2021   Leg swelling 12/23/2018   Multiple thyroid nodules 07/08/2011   Osteopenia    Paroxysmal atrial tachycardia ([place]) 03/09/2021   Partial small bowel obstruction ([place]) 02/09/2019   Pneumonia    Post-cholecystectomy syndrome    Postoperative examination 02/09/2019   Postoperative nausea 02/09/2019   Recurrent nephrolithiasis    Shortness of breath 12/23/2018   Stasis edema with inflammation, bilateral    Thyroid nodule    Vitamin D deficiency    Past Surgical History:  Procedure Laterality Date   ABDOMINAL HYSTERECTOMY  1992   ANKLE FRACTURE SURGERY  2000   CHOLECYSTECTOMY     GALLBLADDER SURGERY  2005   gastric volulus  2012   LITHOTRIPSY     MENISCUS REPAIR  2013   NASAL SINUS SURGERY  11/2020   TONSILLECTOMY  1972   TOTAL KNEE ARTHROPLASTY Left     Allergies  No Known Allergies  Home Medications    Prior to Admission medications   Medication Sig Start Date End Date Taking? Authorizing Provider  aspirin EC 81 MG tablet Take 81 mg by mouth daily.    [provider]  baclofen (LIORESAL) 10 MG tablet Take 10 mg by mouth 3 (three) times daily.    [provider]  doxylamine, Sleep, (SLEEP AID) 25 MG tablet Take 25 mg by mouth at  bedtime as needed for sleep.    [provider]  estradiol (ESTRACE) 2 MG tablet Take 2 mg by mouth 3 (three) times a week. MON - WED - FRI    [provider]  furosemide (LASIX) 20 MG tablet Take 20 mg by mouth daily.    [provider]  meloxicam (MOBIC) 15 MG tablet TAKE 1 TABLET BY MOUTH ONCE (1) DAILY WITH FOOD 02/16/18   [provider]  Multiple Vitamin (MULTIVITAMIN PO) Take 1 tablet by mouth daily.    [provider]  omeprazole (PRILOSEC) 20 MG capsule  Take 20 mg by mouth every morning. 05/10/19   [provider]  polyethylene glycol (MIRALAX / GLYCOLAX) 17 g packet Take 17 g by mouth daily.    [provider]  tamsulosin (FLOMAX) 0.4 MG CAPS capsule Take 0.4 mg by mouth daily. 01/30/21   [provider]  vitamin C (ASCORBIC ACID) 500 MG tablet Take 500 mg by mouth daily.    [provider]  Vitamin D, Ergocalciferol, (DRISDOL) 1.25 MG (50000 UT) CAPS capsule TAKE ONE CAPSULE BY MOUTH TWICE WEEKLY 04/06/18   [provider]    Physical Exam    Vital Signs:  Molly Hogan does not have vital signs available for review today.  Given telephonic nature of communication, physical exam is limited. AAOx3. NAD. Normal affect.  Speech and respirations are unlabored.  Accessory Clinical Findings    None  Assessment & Plan    1.  Preoperative Cardiovascular Risk Assessment:  Molly Hogan's perioperative risk of a major cardiac event is 0.4% according to the Revised Cardiac Risk Index (RCRI).  Therefore, she is at low risk for perioperative complications.   Her functional capacity is good at 5.62 METs according to the Duke Activity Status Index (DASI). Recommendations: According to ACC/AHA guidelines, no further cardiovascular testing needed.  The patient may proceed to surgery at acceptable risk.   Antiplatelet and/or Anticoagulation Recommendations: Aspirin can be held for 7 days prior to her surgery.  Please resume Aspirin post operatively when it is felt to be safe from a bleeding standpoint.    A copy of this note will be routed to requesting surgeon.  Time:   Today, I have spent 10 minutes with the patient with telehealth technology discussing medical history, symptoms, and management plan.     Elgie Collard, PA-C  05/30/2022, 8:25 AM

## 2022-06-07 DIAGNOSIS — M5106 Intervertebral disc disorders with myelopathy, lumbar region: Secondary | ICD-10-CM | POA: Diagnosis not present

## 2022-06-07 DIAGNOSIS — G894 Chronic pain syndrome: Secondary | ICD-10-CM | POA: Diagnosis not present

## 2022-06-13 DIAGNOSIS — N8111 Cystocele, midline: Secondary | ICD-10-CM | POA: Diagnosis not present

## 2022-06-13 DIAGNOSIS — M5106 Intervertebral disc disorders with myelopathy, lumbar region: Secondary | ICD-10-CM | POA: Diagnosis not present

## 2022-06-13 DIAGNOSIS — N819 Female genital prolapse, unspecified: Secondary | ICD-10-CM | POA: Insufficient documentation

## 2022-06-13 DIAGNOSIS — N3946 Mixed incontinence: Secondary | ICD-10-CM | POA: Diagnosis not present

## 2022-06-13 DIAGNOSIS — N3281 Overactive bladder: Secondary | ICD-10-CM | POA: Diagnosis not present

## 2022-06-13 DIAGNOSIS — M791 Myalgia, unspecified site: Secondary | ICD-10-CM | POA: Diagnosis not present

## 2022-06-13 DIAGNOSIS — G894 Chronic pain syndrome: Secondary | ICD-10-CM | POA: Diagnosis not present

## 2022-06-13 DIAGNOSIS — N952 Postmenopausal atrophic vaginitis: Secondary | ICD-10-CM | POA: Diagnosis not present

## 2022-06-15 DIAGNOSIS — D485 Neoplasm of uncertain behavior of skin: Secondary | ICD-10-CM | POA: Diagnosis not present

## 2022-06-25 DIAGNOSIS — M75121 Complete rotator cuff tear or rupture of right shoulder, not specified as traumatic: Secondary | ICD-10-CM | POA: Diagnosis not present

## 2022-06-25 DIAGNOSIS — M19011 Primary osteoarthritis, right shoulder: Secondary | ICD-10-CM | POA: Diagnosis not present

## 2022-06-25 DIAGNOSIS — Y999 Unspecified external cause status: Secondary | ICD-10-CM | POA: Diagnosis not present

## 2022-06-25 DIAGNOSIS — M7521 Bicipital tendinitis, right shoulder: Secondary | ICD-10-CM | POA: Diagnosis not present

## 2022-06-25 DIAGNOSIS — S46191A Other injury of muscle, fascia and tendon of long head of biceps, right arm, initial encounter: Secondary | ICD-10-CM | POA: Diagnosis not present

## 2022-06-25 DIAGNOSIS — S43431A Superior glenoid labrum lesion of right shoulder, initial encounter: Secondary | ICD-10-CM | POA: Diagnosis not present

## 2022-06-25 DIAGNOSIS — S46111A Strain of muscle, fascia and tendon of long head of biceps, right arm, initial encounter: Secondary | ICD-10-CM | POA: Diagnosis not present

## 2022-06-25 DIAGNOSIS — G8918 Other acute postprocedural pain: Secondary | ICD-10-CM | POA: Diagnosis not present

## 2022-06-25 DIAGNOSIS — M7541 Impingement syndrome of right shoulder: Secondary | ICD-10-CM | POA: Diagnosis not present

## 2022-06-25 DIAGNOSIS — M67813 Other specified disorders of tendon, right shoulder: Secondary | ICD-10-CM | POA: Diagnosis not present

## 2022-06-25 DIAGNOSIS — X58XXXA Exposure to other specified factors, initial encounter: Secondary | ICD-10-CM | POA: Diagnosis not present

## 2022-06-25 DIAGNOSIS — S43491A Other sprain of right shoulder joint, initial encounter: Secondary | ICD-10-CM | POA: Diagnosis not present

## 2022-07-02 DIAGNOSIS — M25611 Stiffness of right shoulder, not elsewhere classified: Secondary | ICD-10-CM | POA: Diagnosis not present

## 2022-07-02 DIAGNOSIS — M25511 Pain in right shoulder: Secondary | ICD-10-CM | POA: Diagnosis not present

## 2022-07-04 DIAGNOSIS — M25611 Stiffness of right shoulder, not elsewhere classified: Secondary | ICD-10-CM | POA: Diagnosis not present

## 2022-07-04 DIAGNOSIS — M25511 Pain in right shoulder: Secondary | ICD-10-CM | POA: Diagnosis not present

## 2022-07-08 DIAGNOSIS — Z4789 Encounter for other orthopedic aftercare: Secondary | ICD-10-CM | POA: Diagnosis not present

## 2022-07-09 DIAGNOSIS — M25511 Pain in right shoulder: Secondary | ICD-10-CM | POA: Diagnosis not present

## 2022-07-09 DIAGNOSIS — M25611 Stiffness of right shoulder, not elsewhere classified: Secondary | ICD-10-CM | POA: Diagnosis not present

## 2022-07-11 DIAGNOSIS — M25511 Pain in right shoulder: Secondary | ICD-10-CM | POA: Diagnosis not present

## 2022-07-11 DIAGNOSIS — M25611 Stiffness of right shoulder, not elsewhere classified: Secondary | ICD-10-CM | POA: Diagnosis not present

## 2022-07-16 DIAGNOSIS — M25611 Stiffness of right shoulder, not elsewhere classified: Secondary | ICD-10-CM | POA: Diagnosis not present

## 2022-07-16 DIAGNOSIS — M25511 Pain in right shoulder: Secondary | ICD-10-CM | POA: Diagnosis not present

## 2022-07-18 DIAGNOSIS — M25611 Stiffness of right shoulder, not elsewhere classified: Secondary | ICD-10-CM | POA: Diagnosis not present

## 2022-07-18 DIAGNOSIS — M25511 Pain in right shoulder: Secondary | ICD-10-CM | POA: Diagnosis not present

## 2022-07-22 DIAGNOSIS — M25511 Pain in right shoulder: Secondary | ICD-10-CM | POA: Diagnosis not present

## 2022-07-22 DIAGNOSIS — M25611 Stiffness of right shoulder, not elsewhere classified: Secondary | ICD-10-CM | POA: Diagnosis not present

## 2022-07-24 DIAGNOSIS — M25511 Pain in right shoulder: Secondary | ICD-10-CM | POA: Diagnosis not present

## 2022-07-24 DIAGNOSIS — M25611 Stiffness of right shoulder, not elsewhere classified: Secondary | ICD-10-CM | POA: Diagnosis not present

## 2022-07-25 DIAGNOSIS — N819 Female genital prolapse, unspecified: Secondary | ICD-10-CM | POA: Diagnosis not present

## 2022-07-25 DIAGNOSIS — Z01818 Encounter for other preprocedural examination: Secondary | ICD-10-CM | POA: Diagnosis not present

## 2022-07-29 DIAGNOSIS — M25611 Stiffness of right shoulder, not elsewhere classified: Secondary | ICD-10-CM | POA: Diagnosis not present

## 2022-07-29 DIAGNOSIS — M25511 Pain in right shoulder: Secondary | ICD-10-CM | POA: Diagnosis not present

## 2022-07-31 DIAGNOSIS — M25611 Stiffness of right shoulder, not elsewhere classified: Secondary | ICD-10-CM | POA: Diagnosis not present

## 2022-07-31 DIAGNOSIS — M25511 Pain in right shoulder: Secondary | ICD-10-CM | POA: Diagnosis not present

## 2022-08-05 ENCOUNTER — Other Ambulatory Visit: Payer: Self-pay | Admitting: Cardiology

## 2022-08-05 DIAGNOSIS — M25511 Pain in right shoulder: Secondary | ICD-10-CM | POA: Diagnosis not present

## 2022-08-05 DIAGNOSIS — M25611 Stiffness of right shoulder, not elsewhere classified: Secondary | ICD-10-CM | POA: Diagnosis not present

## 2022-08-06 ENCOUNTER — Telehealth: Payer: Self-pay | Admitting: Cardiology

## 2022-08-06 ENCOUNTER — Other Ambulatory Visit: Payer: Self-pay | Admitting: Cardiology

## 2022-08-06 NOTE — Telephone Encounter (Signed)
Pt c/o medication issue:  1. Name of Medication: Torsemide tamsulosin (FLOMAX) 0.4 MG CAPS capsule   2. How are you currently taking this medication (dosage and times per day)?   3. Are you having a reaction (difficulty breathing--STAT)?   4. What is your medication issue? Pt called in asking for a refill on Torsemide, informed her this was discontinued at last OV. She states she is still taking the furosemide, but is she supposed to be taking torsemide as well.   She also asked if furosemide and tamsulosin are the same thing, please advise.

## 2022-08-07 NOTE — Telephone Encounter (Signed)
LVM to call regarding message from pt

## 2022-08-08 ENCOUNTER — Telehealth: Payer: Self-pay

## 2022-08-08 DIAGNOSIS — M25611 Stiffness of right shoulder, not elsewhere classified: Secondary | ICD-10-CM | POA: Diagnosis not present

## 2022-08-08 DIAGNOSIS — M25511 Pain in right shoulder: Secondary | ICD-10-CM | POA: Diagnosis not present

## 2022-08-08 MED ORDER — TORSEMIDE 20 MG PO TABS: 20.0000 mg | ORAL_TABLET | Freq: Every day | ORAL | 3 refills | Status: DC

## 2022-08-08 NOTE — Telephone Encounter (Signed)
Per Dr. Bettina Gavia pt can stop Lasix and continue Torsemide 20mg  1 tablet daily. Sent to [facility]

## 2022-08-08 NOTE — Telephone Encounter (Signed)
Spoke with pt and she stated that she has been taking Lasix and Torsemide 20mg  each daily by Dr. Bettina Gavia for quite sometime and when she requested a refill of the Torsemide recently it was denied. She is asking why and can she get the Torsemide refilled. Thank you.

## 2022-08-13 DIAGNOSIS — Z7982 Long term (current) use of aspirin: Secondary | ICD-10-CM | POA: Diagnosis not present

## 2022-08-13 DIAGNOSIS — N816 Rectocele: Secondary | ICD-10-CM | POA: Diagnosis not present

## 2022-08-13 DIAGNOSIS — N819 Female genital prolapse, unspecified: Secondary | ICD-10-CM | POA: Diagnosis not present

## 2022-08-13 DIAGNOSIS — Z01810 Encounter for preprocedural cardiovascular examination: Secondary | ICD-10-CM | POA: Diagnosis not present

## 2022-08-13 DIAGNOSIS — N393 Stress incontinence (female) (male): Secondary | ICD-10-CM | POA: Diagnosis not present

## 2022-08-13 DIAGNOSIS — N993 Prolapse of vaginal vault after hysterectomy: Secondary | ICD-10-CM | POA: Diagnosis not present

## 2022-08-13 DIAGNOSIS — K219 Gastro-esophageal reflux disease without esophagitis: Secondary | ICD-10-CM | POA: Diagnosis not present

## 2022-08-13 DIAGNOSIS — N812 Incomplete uterovaginal prolapse: Secondary | ICD-10-CM | POA: Diagnosis not present

## 2022-08-13 DIAGNOSIS — Z79899 Other long term (current) drug therapy: Secondary | ICD-10-CM | POA: Diagnosis not present

## 2022-08-13 DIAGNOSIS — N3946 Mixed incontinence: Secondary | ICD-10-CM | POA: Diagnosis not present

## 2022-08-15 DIAGNOSIS — N819 Female genital prolapse, unspecified: Secondary | ICD-10-CM | POA: Diagnosis not present

## 2022-08-15 DIAGNOSIS — G8918 Other acute postprocedural pain: Secondary | ICD-10-CM | POA: Diagnosis not present

## 2022-08-15 DIAGNOSIS — Z9889 Other specified postprocedural states: Secondary | ICD-10-CM | POA: Diagnosis not present

## 2022-08-15 DIAGNOSIS — R339 Retention of urine, unspecified: Secondary | ICD-10-CM | POA: Diagnosis not present

## 2022-08-22 DIAGNOSIS — M25611 Stiffness of right shoulder, not elsewhere classified: Secondary | ICD-10-CM | POA: Diagnosis not present

## 2022-08-22 DIAGNOSIS — M25511 Pain in right shoulder: Secondary | ICD-10-CM | POA: Diagnosis not present

## 2022-08-26 DIAGNOSIS — M25611 Stiffness of right shoulder, not elsewhere classified: Secondary | ICD-10-CM | POA: Diagnosis not present

## 2022-08-26 DIAGNOSIS — M25511 Pain in right shoulder: Secondary | ICD-10-CM | POA: Diagnosis not present

## 2022-08-28 DIAGNOSIS — E559 Vitamin D deficiency, unspecified: Secondary | ICD-10-CM | POA: Diagnosis not present

## 2022-08-28 DIAGNOSIS — M25611 Stiffness of right shoulder, not elsewhere classified: Secondary | ICD-10-CM | POA: Diagnosis not present

## 2022-08-28 DIAGNOSIS — Z6832 Body mass index (BMI) 32.0-32.9, adult: Secondary | ICD-10-CM | POA: Diagnosis not present

## 2022-08-28 DIAGNOSIS — R252 Cramp and spasm: Secondary | ICD-10-CM | POA: Diagnosis not present

## 2022-08-28 DIAGNOSIS — M25511 Pain in right shoulder: Secondary | ICD-10-CM | POA: Diagnosis not present

## 2022-08-29 DIAGNOSIS — G8918 Other acute postprocedural pain: Secondary | ICD-10-CM | POA: Diagnosis not present

## 2022-08-29 DIAGNOSIS — N819 Female genital prolapse, unspecified: Secondary | ICD-10-CM | POA: Diagnosis not present

## 2022-08-29 DIAGNOSIS — N393 Stress incontinence (female) (male): Secondary | ICD-10-CM | POA: Diagnosis not present

## 2022-08-29 DIAGNOSIS — Z09 Encounter for follow-up examination after completed treatment for conditions other than malignant neoplasm: Secondary | ICD-10-CM | POA: Diagnosis not present

## 2022-09-02 DIAGNOSIS — M25511 Pain in right shoulder: Secondary | ICD-10-CM | POA: Diagnosis not present

## 2022-09-02 DIAGNOSIS — M25611 Stiffness of right shoulder, not elsewhere classified: Secondary | ICD-10-CM | POA: Diagnosis not present

## 2022-09-04 DIAGNOSIS — M25611 Stiffness of right shoulder, not elsewhere classified: Secondary | ICD-10-CM | POA: Diagnosis not present

## 2022-09-04 DIAGNOSIS — M25511 Pain in right shoulder: Secondary | ICD-10-CM | POA: Diagnosis not present

## 2022-09-05 DIAGNOSIS — Z6836 Body mass index (BMI) 36.0-36.9, adult: Secondary | ICD-10-CM | POA: Diagnosis not present

## 2022-09-05 DIAGNOSIS — M791 Myalgia, unspecified site: Secondary | ICD-10-CM | POA: Diagnosis not present

## 2022-09-05 DIAGNOSIS — M5106 Intervertebral disc disorders with myelopathy, lumbar region: Secondary | ICD-10-CM | POA: Diagnosis not present

## 2022-09-05 DIAGNOSIS — G894 Chronic pain syndrome: Secondary | ICD-10-CM | POA: Diagnosis not present

## 2022-09-09 DIAGNOSIS — M25611 Stiffness of right shoulder, not elsewhere classified: Secondary | ICD-10-CM | POA: Diagnosis not present

## 2022-09-09 DIAGNOSIS — M25511 Pain in right shoulder: Secondary | ICD-10-CM | POA: Diagnosis not present

## 2022-09-12 DIAGNOSIS — M25611 Stiffness of right shoulder, not elsewhere classified: Secondary | ICD-10-CM | POA: Diagnosis not present

## 2022-09-12 DIAGNOSIS — M25511 Pain in right shoulder: Secondary | ICD-10-CM | POA: Diagnosis not present

## 2022-09-13 DIAGNOSIS — L820 Inflamed seborrheic keratosis: Secondary | ICD-10-CM | POA: Diagnosis not present

## 2022-09-13 DIAGNOSIS — D485 Neoplasm of uncertain behavior of skin: Secondary | ICD-10-CM | POA: Diagnosis not present

## 2022-09-13 DIAGNOSIS — L728 Other follicular cysts of the skin and subcutaneous tissue: Secondary | ICD-10-CM | POA: Diagnosis not present

## 2022-09-16 DIAGNOSIS — M25611 Stiffness of right shoulder, not elsewhere classified: Secondary | ICD-10-CM | POA: Diagnosis not present

## 2022-09-16 DIAGNOSIS — M25511 Pain in right shoulder: Secondary | ICD-10-CM | POA: Diagnosis not present

## 2022-09-19 DIAGNOSIS — M25611 Stiffness of right shoulder, not elsewhere classified: Secondary | ICD-10-CM | POA: Diagnosis not present

## 2022-09-19 DIAGNOSIS — M25511 Pain in right shoulder: Secondary | ICD-10-CM | POA: Diagnosis not present

## 2022-09-25 DIAGNOSIS — M25511 Pain in right shoulder: Secondary | ICD-10-CM | POA: Diagnosis not present

## 2022-09-25 DIAGNOSIS — M25611 Stiffness of right shoulder, not elsewhere classified: Secondary | ICD-10-CM | POA: Diagnosis not present

## 2022-10-02 DIAGNOSIS — M25611 Stiffness of right shoulder, not elsewhere classified: Secondary | ICD-10-CM | POA: Diagnosis not present

## 2022-10-02 DIAGNOSIS — M25511 Pain in right shoulder: Secondary | ICD-10-CM | POA: Diagnosis not present

## 2022-10-03 DIAGNOSIS — N393 Stress incontinence (female) (male): Secondary | ICD-10-CM | POA: Diagnosis not present

## 2022-10-03 DIAGNOSIS — N819 Female genital prolapse, unspecified: Secondary | ICD-10-CM | POA: Diagnosis not present

## 2022-10-07 DIAGNOSIS — M25511 Pain in right shoulder: Secondary | ICD-10-CM | POA: Diagnosis not present

## 2022-10-07 DIAGNOSIS — M25611 Stiffness of right shoulder, not elsewhere classified: Secondary | ICD-10-CM | POA: Diagnosis not present

## 2022-10-21 DIAGNOSIS — L03116 Cellulitis of left lower limb: Secondary | ICD-10-CM | POA: Diagnosis not present

## 2022-10-21 DIAGNOSIS — Z6832 Body mass index (BMI) 32.0-32.9, adult: Secondary | ICD-10-CM | POA: Diagnosis not present

## 2022-10-23 DIAGNOSIS — M25511 Pain in right shoulder: Secondary | ICD-10-CM | POA: Diagnosis not present

## 2022-10-25 DIAGNOSIS — Z4789 Encounter for other orthopedic aftercare: Secondary | ICD-10-CM | POA: Diagnosis not present

## 2022-10-29 DIAGNOSIS — L03116 Cellulitis of left lower limb: Secondary | ICD-10-CM | POA: Diagnosis not present

## 2022-10-29 DIAGNOSIS — M25511 Pain in right shoulder: Secondary | ICD-10-CM | POA: Diagnosis not present

## 2022-10-30 DIAGNOSIS — M25511 Pain in right shoulder: Secondary | ICD-10-CM | POA: Diagnosis not present

## 2022-10-31 DIAGNOSIS — L03116 Cellulitis of left lower limb: Secondary | ICD-10-CM | POA: Diagnosis not present

## 2022-10-31 DIAGNOSIS — Z6832 Body mass index (BMI) 32.0-32.9, adult: Secondary | ICD-10-CM | POA: Diagnosis not present

## 2022-10-31 DIAGNOSIS — R61 Generalized hyperhidrosis: Secondary | ICD-10-CM | POA: Diagnosis not present

## 2022-10-31 DIAGNOSIS — R42 Dizziness and giddiness: Secondary | ICD-10-CM | POA: Diagnosis not present

## 2022-11-01 DIAGNOSIS — M79605 Pain in left leg: Secondary | ICD-10-CM | POA: Diagnosis not present

## 2022-11-01 DIAGNOSIS — L03116 Cellulitis of left lower limb: Secondary | ICD-10-CM | POA: Diagnosis not present

## 2022-11-01 DIAGNOSIS — M7989 Other specified soft tissue disorders: Secondary | ICD-10-CM | POA: Diagnosis not present

## 2022-11-01 DIAGNOSIS — K219 Gastro-esophageal reflux disease without esophagitis: Secondary | ICD-10-CM | POA: Diagnosis not present

## 2022-11-01 DIAGNOSIS — Z79899 Other long term (current) drug therapy: Secondary | ICD-10-CM | POA: Diagnosis not present

## 2022-11-02 DIAGNOSIS — K219 Gastro-esophageal reflux disease without esophagitis: Secondary | ICD-10-CM | POA: Diagnosis not present

## 2022-11-02 DIAGNOSIS — L03116 Cellulitis of left lower limb: Secondary | ICD-10-CM | POA: Diagnosis not present

## 2022-11-02 DIAGNOSIS — Z79899 Other long term (current) drug therapy: Secondary | ICD-10-CM | POA: Diagnosis not present

## 2022-11-03 DIAGNOSIS — K219 Gastro-esophageal reflux disease without esophagitis: Secondary | ICD-10-CM | POA: Diagnosis not present

## 2022-11-03 DIAGNOSIS — L03116 Cellulitis of left lower limb: Secondary | ICD-10-CM | POA: Diagnosis not present

## 2022-11-03 DIAGNOSIS — Z79899 Other long term (current) drug therapy: Secondary | ICD-10-CM | POA: Diagnosis not present

## 2022-11-06 DIAGNOSIS — M25511 Pain in right shoulder: Secondary | ICD-10-CM | POA: Diagnosis not present

## 2022-11-11 DIAGNOSIS — Z0000 Encounter for general adult medical examination without abnormal findings: Secondary | ICD-10-CM | POA: Diagnosis not present

## 2022-11-11 DIAGNOSIS — I87323 Chronic venous hypertension (idiopathic) with inflammation of bilateral lower extremity: Secondary | ICD-10-CM | POA: Diagnosis not present

## 2022-11-11 DIAGNOSIS — N3001 Acute cystitis with hematuria: Secondary | ICD-10-CM | POA: Diagnosis not present

## 2022-11-11 DIAGNOSIS — R300 Dysuria: Secondary | ICD-10-CM | POA: Diagnosis not present

## 2022-11-11 DIAGNOSIS — M8589 Other specified disorders of bone density and structure, multiple sites: Secondary | ICD-10-CM | POA: Diagnosis not present

## 2022-11-11 DIAGNOSIS — L03116 Cellulitis of left lower limb: Secondary | ICD-10-CM | POA: Diagnosis not present

## 2022-11-11 DIAGNOSIS — M8580 Other specified disorders of bone density and structure, unspecified site: Secondary | ICD-10-CM | POA: Diagnosis not present

## 2022-11-11 DIAGNOSIS — N390 Urinary tract infection, site not specified: Secondary | ICD-10-CM | POA: Diagnosis not present

## 2022-11-14 DIAGNOSIS — M25511 Pain in right shoulder: Secondary | ICD-10-CM | POA: Diagnosis not present

## 2022-11-26 DIAGNOSIS — M25511 Pain in right shoulder: Secondary | ICD-10-CM | POA: Diagnosis not present

## 2022-11-28 DIAGNOSIS — M25511 Pain in right shoulder: Secondary | ICD-10-CM | POA: Diagnosis not present

## 2022-12-03 DIAGNOSIS — M8580 Other specified disorders of bone density and structure, unspecified site: Secondary | ICD-10-CM | POA: Diagnosis not present

## 2022-12-03 DIAGNOSIS — Z6832 Body mass index (BMI) 32.0-32.9, adult: Secondary | ICD-10-CM | POA: Diagnosis not present

## 2022-12-03 DIAGNOSIS — L03116 Cellulitis of left lower limb: Secondary | ICD-10-CM | POA: Diagnosis not present

## 2022-12-06 DIAGNOSIS — M791 Myalgia, unspecified site: Secondary | ICD-10-CM | POA: Diagnosis not present

## 2022-12-06 DIAGNOSIS — G894 Chronic pain syndrome: Secondary | ICD-10-CM | POA: Diagnosis not present

## 2022-12-06 DIAGNOSIS — M5106 Intervertebral disc disorders with myelopathy, lumbar region: Secondary | ICD-10-CM | POA: Diagnosis not present

## 2022-12-12 DIAGNOSIS — H16223 Keratoconjunctivitis sicca, not specified as Sjogren's, bilateral: Secondary | ICD-10-CM | POA: Diagnosis not present

## 2022-12-12 DIAGNOSIS — H2513 Age-related nuclear cataract, bilateral: Secondary | ICD-10-CM | POA: Diagnosis not present

## 2022-12-13 DIAGNOSIS — I83019 Varicose veins of right lower extremity with ulcer of unspecified site: Secondary | ICD-10-CM | POA: Diagnosis not present

## 2022-12-13 DIAGNOSIS — L97819 Non-pressure chronic ulcer of other part of right lower leg with unspecified severity: Secondary | ICD-10-CM | POA: Diagnosis not present

## 2022-12-13 DIAGNOSIS — L97829 Non-pressure chronic ulcer of other part of left lower leg with unspecified severity: Secondary | ICD-10-CM | POA: Diagnosis not present

## 2022-12-13 DIAGNOSIS — M793 Panniculitis, unspecified: Secondary | ICD-10-CM | POA: Diagnosis not present

## 2022-12-13 DIAGNOSIS — I83218 Varicose veins of right lower extremity with both ulcer of other part of lower extremity and inflammation: Secondary | ICD-10-CM | POA: Diagnosis not present

## 2022-12-13 DIAGNOSIS — I83228 Varicose veins of left lower extremity with both ulcer of other part of lower extremity and inflammation: Secondary | ICD-10-CM | POA: Diagnosis not present

## 2022-12-17 DIAGNOSIS — T148XXA Other injury of unspecified body region, initial encounter: Secondary | ICD-10-CM | POA: Diagnosis not present

## 2022-12-17 DIAGNOSIS — R600 Localized edema: Secondary | ICD-10-CM | POA: Diagnosis not present

## 2022-12-17 DIAGNOSIS — Z6833 Body mass index (BMI) 33.0-33.9, adult: Secondary | ICD-10-CM | POA: Diagnosis not present

## 2022-12-27 DIAGNOSIS — I8310 Varicose veins of unspecified lower extremity with inflammation: Secondary | ICD-10-CM | POA: Diagnosis not present

## 2023-01-12 DIAGNOSIS — M7989 Other specified soft tissue disorders: Secondary | ICD-10-CM | POA: Diagnosis not present

## 2023-01-12 DIAGNOSIS — L03115 Cellulitis of right lower limb: Secondary | ICD-10-CM | POA: Diagnosis not present

## 2023-01-13 DIAGNOSIS — M7989 Other specified soft tissue disorders: Secondary | ICD-10-CM | POA: Diagnosis not present

## 2023-01-13 DIAGNOSIS — R600 Localized edema: Secondary | ICD-10-CM | POA: Diagnosis not present

## 2023-01-22 DIAGNOSIS — R1031 Right lower quadrant pain: Secondary | ICD-10-CM | POA: Diagnosis not present

## 2023-01-22 DIAGNOSIS — R310 Gross hematuria: Secondary | ICD-10-CM | POA: Diagnosis not present

## 2023-01-22 NOTE — Progress Notes (Unsigned)
Cardiology Office Note:  .   Date:  01/23/2023  ID:  Carolynne Edouard, DOB [birth year], MRN [id number] PCP: [facility], Stephanie Coup, MD  [facility] Providers Cardiologist:  Norman Herrlich, MD    History of Present Illness: .   Floetta Chiappa Manke is a 72 y.o. female with a past medical history of aortic atherosclerosis noted on CT imaging, paroxysmal atrial tachycardia, mild mitral regurgitation, pedal edema.  03/20/2021 echo EF 60 to 65%, mild LVH, mild MR  Most recently evaluated by Dr. Dulce Sellar on 11/29/2021, she was doing well from a cardiac perspective, no recurrent tachycardia, advised to follow-up in 1 year.  She presents today with concerns surrounding pedal edema.  She noticed a few weeks ago she was walking around her house and her legs were apparently so swollen that they were weeping and leaving with spots.  She presented to an urgent care for evaluation, they gave her some cream to put on her lower legs, arranged for venous ultrasound which was negative for DVT.  He also has some complaints with DOE times. She denies chest pain, palpitations, dyspnea, pnd, orthopnea, n, v, dizziness, syncope, weight gain, or early satiety.    ROS: Review of Systems  Constitutional: Negative.   HENT: Negative.    Eyes: Negative.   Respiratory:  Positive for shortness of breath.   Cardiovascular:  Positive for claudication and leg swelling.  Gastrointestinal: Negative.   Genitourinary: Negative.   Musculoskeletal: Negative.   Neurological: Negative.   Endo/Heme/Allergies:  Bruises/bleeds easily.  Psychiatric/Behavioral: Negative.       Studies Reviewed: .        Cardiac Studies & Procedures       ECHOCARDIOGRAM  ECHOCARDIOGRAM COMPLETE 03/20/2021  Narrative ECHOCARDIOGRAM REPORT    Patient Name:   LAVENDA VILLALOVOS Date of Exam: 03/20/2021 Medical Rec #:  [id number]           Height:       63.0 in Accession #:    [id number]          Weight:       171.0 lb Date of Birth:   1950/11/17           BSA:          1.809 m Patient Age:    70 years            BP:           110/80 mmHg Patient Gender: F                   HR:           66 bpm. Exam Location:  [place]  Procedure: 2D Echo, Color Doppler, Cardiac Doppler and Strain Analysis  Indications:    Shortness of breath [R06.02 (ICD-10-CM)]  History:        Patient has prior history of Echocardiogram examinations, most recent 12/28/2018. Arrythmias:Paroxysmal atrial tachycardia.  Sonographer:    Louie [place] RDCS Referring Phys: [id number] BRIAN J MUNLEY  IMPRESSIONS   1. Left ventricular ejection fraction, by estimation, is 60 to 65%. The left ventricle has normal function. The left ventricle has no regional wall motion abnormalities. There is mild left ventricular hypertrophy. Left ventricular diastolic parameters were normal. 2. Right ventricular systolic function is normal. The right ventricular size is normal. There is normal pulmonary artery systolic pressure. 3. The mitral valve is normal in structure. Mild mitral valve regurgitation. No evidence of mitral stenosis. 4. The aortic valve is normal  in structure. Aortic valve regurgitation is not visualized. No aortic stenosis is present. 5. The inferior vena cava is normal in size with greater than 50% respiratory variability, suggesting right atrial pressure of 3 mmHg.  FINDINGS Left Ventricle: Left ventricular ejection fraction, by estimation, is 60 to 65%. The left ventricle has normal function. The left ventricle has no regional wall motion abnormalities. The left ventricular internal cavity size was normal in size. There is mild left ventricular hypertrophy. Left ventricular diastolic parameters were normal.  Right Ventricle: The right ventricular size is normal. No increase in right ventricular wall thickness. Right ventricular systolic function is normal. There is normal pulmonary artery systolic pressure. The tricuspid regurgitant velocity is 2.05 m/s,  and with an assumed right atrial pressure of 3 mmHg, the estimated right ventricular systolic pressure is 19.8 mmHg.  Left Atrium: Left atrial size was normal in size.  Right Atrium: Right atrial size was normal in size.  Pericardium: There is no evidence of pericardial effusion.  Mitral Valve: The mitral valve is normal in structure. Mild mitral valve regurgitation. No evidence of mitral valve stenosis.  Tricuspid Valve: The tricuspid valve is normal in structure. Tricuspid valve regurgitation is mild . No evidence of tricuspid stenosis.  Aortic Valve: The aortic valve is normal in structure. Aortic valve regurgitation is not visualized. No aortic stenosis is present.  Pulmonic Valve: The pulmonic valve was normal in structure. Pulmonic valve regurgitation is not visualized. No evidence of pulmonic stenosis.  Aorta: The aortic root is normal in size and structure.  Venous: The inferior vena cava is normal in size with greater than 50% respiratory variability, suggesting right atrial pressure of 3 mmHg.  IAS/Shunts: No atrial level shunt detected by color flow Doppler.   LEFT VENTRICLE PLAX 2D LVIDd:         4.35 cm   Diastology LVIDs:         2.80 cm   LV e' medial:    6.09 cm/s LV PW:         1.30 cm   LV E/e' medial:  14.9 LV IVS:        1.25 cm   LV e' lateral:   9.25 cm/s LVOT diam:     2.10 cm   LV E/e' lateral: 9.8 LV SV:         84 LV SV Index:   46 LVOT Area:     3.46 cm   RIGHT VENTRICLE RV S prime:     9.03 cm/s  LEFT ATRIUM             Index        RIGHT ATRIUM           Index LA diam:        3.70 cm 2.05 cm/m   RA Area:     12.00 cm LA Vol (A2C):   39.4 ml 21.78 ml/m  RA Volume:   24.00 ml  13.27 ml/m LA Vol (A4C):   50.3 ml 27.80 ml/m LA Biplane Vol: 47.8 ml 26.42 ml/m AORTIC VALVE LVOT Vmax:   102.00 cm/s LVOT Vmean:  74.800 cm/s LVOT VTI:    0.242 m  AORTA Ao Root diam: 3.20 cm Ao Asc diam:  3.30 cm  MITRAL VALVE               TRICUSPID  VALVE MV Area (PHT): 3.17 cm    TR Peak grad:   16.8 mmHg MV Decel Time: 239 msec  TR Vmax:        205.00 cm/s MV E velocity: 90.80 cm/s MV A velocity: 63.40 cm/s  SHUNTS MV E/A ratio:  1.43        Systemic VTI:  0.24 m Systemic Diam: 2.10 cm  Gypsy Balsam MD Electronically signed by Gypsy Balsam MD Signature Date/Time: 03/20/2021/4:53:00 PM    Final             Risk Assessment/Calculations:             Physical Exam:   VS:  BP 124/87 (BP Location: Right Arm, Patient Position: Sitting, Cuff Size: Normal)   Pulse 88   Ht 5\' 1"  (1.549 m)   Wt 176 lb (79.8 kg)   SpO2 96%   BMI 33.25 kg/m    Wt Readings from Last 3 Encounters:  01/23/23 176 lb (79.8 kg)  11/29/21 175 lb 0.6 oz (79.4 kg)  03/09/21 171 lb (77.6 kg)    GEN: Well nourished, well developed in no acute distress NECK: No JVD; No carotid bruits CARDIAC: RRR, no murmurs, rubs, gallops RESPIRATORY:  Clear to auscultation without rales, wheezing or rhonchi  ABDOMEN: Soft, non-tender, non-distended EXTREMITIES:  +2 pedal edema; non-healing wounds; No deformity   ASSESSMENT AND PLAN: .   Paroxysmal atrial tachycardia-quiescent.  Heart rate is well-controlled the office today.  DOE-will repeat echocardiogram.  Will check proBNP.  Most recent echo did not reveal any diastolic dysfunction.  Pedal edema/claudication-she has been bothered by lower leg swelling for some time, is recently got notably worse.  She has multiple spots that are nonhealing in nature.  Recently had DVT study at urgent care which was negative.  Will arrange for ABIs with segmentals.  Will check proBNP and hemoglobin A1c.  Dyslipidemia-it does not appear her cholesterol has been checked in some time however previous LDL was elevated greater than 100.  Will repeat FLP and LFTs.       Dispo: ABI with segmental's, echocardiogram, FLP and LFTs, A1c, proBNP.  Return in 6 weeks.  Signed, Flossie Dibble, NP

## 2023-01-23 ENCOUNTER — Encounter: Payer: Self-pay | Admitting: Cardiology

## 2023-01-23 ENCOUNTER — Ambulatory Visit: Payer: Medicare Other | Attending: Cardiology | Admitting: Cardiology

## 2023-01-23 VITALS — BP 124/87 | HR 88 | Ht 61.0 in | Wt 176.0 lb

## 2023-01-23 DIAGNOSIS — Z131 Encounter for screening for diabetes mellitus: Secondary | ICD-10-CM

## 2023-01-23 DIAGNOSIS — E7800 Pure hypercholesterolemia, unspecified: Secondary | ICD-10-CM

## 2023-01-23 DIAGNOSIS — R0600 Dyspnea, unspecified: Secondary | ICD-10-CM | POA: Diagnosis present

## 2023-01-23 DIAGNOSIS — I700 Atherosclerosis of aorta: Secondary | ICD-10-CM | POA: Diagnosis present

## 2023-01-23 DIAGNOSIS — M7989 Other specified soft tissue disorders: Secondary | ICD-10-CM

## 2023-01-23 DIAGNOSIS — R600 Localized edema: Secondary | ICD-10-CM

## 2023-01-23 DIAGNOSIS — I4719 Other supraventricular tachycardia: Secondary | ICD-10-CM | POA: Diagnosis present

## 2023-01-23 MED ORDER — TORSEMIDE 20 MG PO TABS: 20.0000 mg | ORAL_TABLET | Freq: Every day | ORAL | 3 refills | Status: DC

## 2023-01-23 NOTE — Patient Instructions (Addendum)
Medication Instructions:  Your physician recommends that you continue on your current medications as directed. Please refer to the Current Medication list given to you today.  *If you need a refill on your cardiac medications before your next appointment, please call your pharmacy*   Lab Work: Your physician recommends that you return for lab work in: While fasting come back for Fasting Lipid Panel, Liver Function Test and A1C, ProBNP Lab opens at 8am. You DO NOT NEED an appointment. Best time to come is between 8am and 12noon and between 1:30 and 4:30. If you have been asked to fast for your blood work please have nothing to eat or drink after midnight. You may have water.   If you have labs (blood work) drawn today and your tests are completely normal, you will receive your results only by: MyChart Message (if you have MyChart) OR A paper copy in the mail If you have any lab test that is abnormal or we need to change your treatment, we will call you to review the results.   Testing/Procedures: Your physician has requested that you have an echocardiogram. Echocardiography is a painless test that uses sound waves to create images of your heart. It provides your doctor with information about the size and shape of your heart and how well your heart's chambers and valves are working. This procedure takes approximately one hour. There are no restrictions for this procedure. Please do NOT wear cologne, perfume, aftershave, or lotions (deodorant is allowed). Please arrive 15 minutes prior to your appointment time.   Your physician has requested that you have an ankle brachial index (ABI). During this test an ultrasound and blood pressure cuff are used to evaluate the arteries that supply the arms and legs with blood. Allow thirty minutes for this exam. There are no restrictions or special instructions.    Follow-Up: At [facility], you and your health needs are our priority.  As part of  our continuing mission to provide you with exceptional heart care, we have created designated Provider Care Teams.  These Care Teams include your primary Cardiologist (physician) and Advanced Practice Providers (APPs -  Physician Assistants and Nurse Practitioners) who all work together to provide you with the care you need, when you need it.  We recommend signing up for the patient portal called "MyChart".  Sign up information is provided on this After Visit Summary.  MyChart is used to connect with patients for Virtual Visits (Telemedicine).  Patients are able to view lab/test results, encounter notes, upcoming appointments, etc.  Non-urgent messages can be sent to your provider as well.   To learn more about what you can do with MyChart, go to [email or web address].    Your next appointment:   6 week(s)  Provider:   Wallis Bamberg, NP Rosalita Levan)    Other Instructions

## 2023-01-29 DIAGNOSIS — R0600 Dyspnea, unspecified: Secondary | ICD-10-CM | POA: Diagnosis not present

## 2023-01-29 DIAGNOSIS — I4719 Other supraventricular tachycardia: Secondary | ICD-10-CM | POA: Diagnosis not present

## 2023-01-29 DIAGNOSIS — E7800 Pure hypercholesterolemia, unspecified: Secondary | ICD-10-CM | POA: Diagnosis not present

## 2023-01-29 DIAGNOSIS — Z131 Encounter for screening for diabetes mellitus: Secondary | ICD-10-CM | POA: Diagnosis not present

## 2023-01-29 DIAGNOSIS — M7989 Other specified soft tissue disorders: Secondary | ICD-10-CM | POA: Diagnosis not present

## 2023-01-29 DIAGNOSIS — R600 Localized edema: Secondary | ICD-10-CM | POA: Diagnosis not present

## 2023-01-31 ENCOUNTER — Telehealth: Payer: Self-pay | Admitting: *Deleted

## 2023-01-31 DIAGNOSIS — M7918 Myalgia, other site: Secondary | ICD-10-CM | POA: Diagnosis not present

## 2023-01-31 DIAGNOSIS — Z6832 Body mass index (BMI) 32.0-32.9, adult: Secondary | ICD-10-CM | POA: Diagnosis not present

## 2023-01-31 DIAGNOSIS — D692 Other nonthrombocytopenic purpura: Secondary | ICD-10-CM | POA: Diagnosis not present

## 2023-01-31 DIAGNOSIS — E7800 Pure hypercholesterolemia, unspecified: Secondary | ICD-10-CM

## 2023-01-31 LAB — PRO B NATRIURETIC PEPTIDE: NT-Pro BNP: 194 pg/mL (ref 0–301)

## 2023-01-31 LAB — HEPATIC FUNCTION PANEL
ALT: 17 IU/L (ref 0–32)
AST: 23 IU/L (ref 0–40)
Albumin: 3.8 g/dL (ref 3.8–4.8)
Alkaline Phosphatase: 115 IU/L (ref 44–121)
Bilirubin Total: 0.3 mg/dL (ref 0.0–1.2)
Bilirubin, Direct: 0.11 mg/dL (ref 0.00–0.40)
Total Protein: 5.9 g/dL — ABNORMAL LOW (ref 6.0–8.5)

## 2023-01-31 LAB — LIPID PANEL
Chol/HDL Ratio: 3.3 ratio (ref 0.0–4.4)
Cholesterol, Total: 213 mg/dL — ABNORMAL HIGH (ref 100–199)
HDL: 65 mg/dL
LDL Chol Calc (NIH): 135 mg/dL — ABNORMAL HIGH (ref 0–99)
Triglycerides: 71 mg/dL (ref 0–149)
VLDL Cholesterol Cal: 13 mg/dL (ref 5–40)

## 2023-01-31 LAB — HEMOGLOBIN A1C
Est. average glucose Bld gHb Est-mCnc: 111 mg/dL
Hgb A1c MFr Bld: 5.5 % (ref 4.8–5.6)

## 2023-01-31 MED ORDER — ATORVASTATIN CALCIUM 40 MG PO TABS: 40.0000 mg | ORAL_TABLET | Freq: Every day | ORAL | 3 refills | Status: DC

## 2023-01-31 NOTE — Telephone Encounter (Signed)
-----   Message from Flossie Dibble sent at 01/31/2023 10:42 AM EDT ----- Cholesterol is too high, start Lipitor 20 mg daily, repeat FLP and LFTs in 8 weeks.  No diabetes, A1C is good!  No indication that you are holding on to too much fluid because of your heart.   Likely the legs are swollen from venous insufficiency and not related to her heart.   If her legs are still swollen, she can increase demadex to 40 mg daily. Return in 2 weeks for repeat BMET.

## 2023-01-31 NOTE — Telephone Encounter (Signed)
Pt has reviewed results via my chart  New script sent to the pharmacy  Lab orders mailed to the pt  

## 2023-02-01 DIAGNOSIS — D485 Neoplasm of uncertain behavior of skin: Secondary | ICD-10-CM | POA: Diagnosis not present

## 2023-02-01 DIAGNOSIS — R233 Spontaneous ecchymoses: Secondary | ICD-10-CM | POA: Diagnosis not present

## 2023-02-01 DIAGNOSIS — I8310 Varicose veins of unspecified lower extremity with inflammation: Secondary | ICD-10-CM | POA: Diagnosis not present

## 2023-02-03 ENCOUNTER — Other Ambulatory Visit: Payer: Self-pay | Admitting: *Deleted

## 2023-02-03 DIAGNOSIS — I87323 Chronic venous hypertension (idiopathic) with inflammation of bilateral lower extremity: Secondary | ICD-10-CM

## 2023-02-06 DIAGNOSIS — R319 Hematuria, unspecified: Secondary | ICD-10-CM | POA: Diagnosis not present

## 2023-02-06 DIAGNOSIS — R310 Gross hematuria: Secondary | ICD-10-CM | POA: Diagnosis not present

## 2023-02-06 DIAGNOSIS — K449 Diaphragmatic hernia without obstruction or gangrene: Secondary | ICD-10-CM | POA: Diagnosis not present

## 2023-02-06 DIAGNOSIS — N281 Cyst of kidney, acquired: Secondary | ICD-10-CM | POA: Diagnosis not present

## 2023-02-06 DIAGNOSIS — Z87442 Personal history of urinary calculi: Secondary | ICD-10-CM | POA: Diagnosis not present

## 2023-02-06 DIAGNOSIS — N200 Calculus of kidney: Secondary | ICD-10-CM | POA: Diagnosis not present

## 2023-02-13 ENCOUNTER — Ambulatory Visit (HOSPITAL_COMMUNITY)
Admission: RE | Admit: 2023-02-13 | Discharge: 2023-02-13 | Disposition: A | Discharge status: Home / Self Care | Payer: Medicare Other | Source: Ambulatory Visit | Attending: Surgery | Admitting: Surgery

## 2023-02-13 ENCOUNTER — Ambulatory Visit: Payer: Medicare Other | Admitting: Physician Assistant

## 2023-02-13 VITALS — BP 137/91 | HR 79 | Temp 97.6°F | Resp 20 | Ht 61.0 in | Wt 175.6 lb

## 2023-02-13 DIAGNOSIS — I8311 Varicose veins of right lower extremity with inflammation: Secondary | ICD-10-CM | POA: Diagnosis not present

## 2023-02-13 DIAGNOSIS — I87323 Chronic venous hypertension (idiopathic) with inflammation of bilateral lower extremity: Secondary | ICD-10-CM | POA: Diagnosis not present

## 2023-02-13 DIAGNOSIS — I8312 Varicose veins of left lower extremity with inflammation: Secondary | ICD-10-CM | POA: Diagnosis not present

## 2023-02-13 DIAGNOSIS — I872 Venous insufficiency (chronic) (peripheral): Secondary | ICD-10-CM

## 2023-02-13 NOTE — Progress Notes (Signed)
Requested by:  Bobbye Morton, MD [address] [place],  [place] [address]  Reason for consultation: edema and varicose veins    History of Present Illness   Molly Hogan is a 72 y.o. (Apr 27, 1951) female who presents for evaluation of bilateral lower extremity varicose veins and edema. She has had trouble with her veins for many years. She has had multiple prior injections at [facility] several years ago.  She also believes she had an ablation but she is unsure which leg was treated. She explains that she has had some other serious medical issues in the mean time so she did not end up following up with them. She gets some itching in areas of her spider veins. She also gets swelling regularly as well as aching. She wears knee high compression stockings daily, which does help. She admits to not elevating her legs. 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 She says that on those days she does do a lot of sitting, otherwise she feels she stays fairly active. She does not have any history of DVT. She does have family history of venous disease.   Venous symptoms include: aching, itching, swelling, spider veins Onset/duration:  > 5 years  Occupation:  Print production planner Aggravating factors: sitting, standing Alleviating factors: elevation, compression Compression:  yes Helps:  yes Pain medications:  no Previous vein procedures:  sclerotherapy History of DVT:  No  Past Medical History:  Diagnosis Date   Acquired toe deformity, right 07/29/2018   Arthritis, midfoot 07/29/2018   Barrett esophagus    Class 1 obesity    Great toe pain, right 07/29/2018   History of abnormal cervical Papanicolaou smear 11/22/2021   Leg swelling 12/23/2018   Multiple thyroid nodules 07/08/2011   Osteopenia    Paroxysmal atrial tachycardia (HCC) 03/09/2021   Partial small bowel obstruction (HCC) 02/09/2019   Pneumonia    Post-cholecystectomy syndrome    Postoperative examination  02/09/2019   Postoperative nausea 02/09/2019   Recurrent nephrolithiasis    Shortness of breath 12/23/2018   Stasis edema with inflammation, bilateral    Thyroid nodule    Vitamin D deficiency     Past Surgical History:  Procedure Laterality Date   ABDOMINAL HYSTERECTOMY  1992   ANKLE FRACTURE SURGERY  2000   CHOLECYSTECTOMY     GALLBLADDER SURGERY  2005   gastric volulus  2012   LITHOTRIPSY     MENISCUS REPAIR  2013   NASAL SINUS SURGERY  11/2020   TONSILLECTOMY  1972   TOTAL KNEE ARTHROPLASTY Left     Social History   Socioeconomic History   Marital status: Married    Spouse name: Not on file   Number of children: Not on file   Years of education: Not on file   Highest education level: Not on file  Occupational History   Not on file  Tobacco Use   Smoking status: Never   Smokeless tobacco: Never  Vaping Use   Vaping status: Never Used  Substance and Sexual Activity   Alcohol use: No   Drug use: No   Sexual activity: Not on file  Other Topics Concern   Not on file  Social History Narrative   Not on file   Social Determinants of Health   Financial Resource Strain: Not on file  Food Insecurity: Not on file  Transportation Needs: Not on file  Physical Activity: Not on file  Stress: Not on file  Social  Connections: Not on file  Intimate Partner Violence: Not on file    Family History  Problem Relation Age of Onset   Cancer Mother        breast, uterine   Heart disease Mother    AAA (abdominal aortic aneurysm) Mother     Current Outpatient Medications  Medication Sig Dispense Refill   aspirin EC 81 MG tablet Take 81 mg by mouth daily.     atorvastatin (LIPITOR) 40 MG tablet Take 1 tablet (40 mg total) by mouth daily. 90 tablet 3   baclofen (LIORESAL) 10 MG tablet Take 10 mg by mouth 3 (three) times daily.     docusate sodium (COLACE) 100 MG capsule Take 200 mg by mouth at bedtime.     doxylamine, Sleep, (SLEEP AID) 25 MG tablet Take 25 mg by mouth at  bedtime as needed for sleep.     meloxicam (MOBIC) 15 MG tablet TAKE 1 TABLET BY MOUTH ONCE (1) DAILY WITH FOOD  0   Multiple Vitamin (MULTIVITAMIN PO) Take 1 tablet by mouth daily.     omeprazole (PRILOSEC) 20 MG capsule Take 20 mg by mouth every morning.     polyethylene glycol (MIRALAX / GLYCOLAX) 17 g packet Take 17 g by mouth daily.     psyllium (METAMUCIL) 58.6 % packet Take 1 packet by mouth 2 (two) times daily.     tamsulosin (FLOMAX) 0.4 MG CAPS capsule Take 0.4 mg by mouth daily.     torsemide (DEMADEX) 20 MG tablet Take 1 tablet (20 mg total) by mouth daily. 90 tablet 3   vitamin C (ASCORBIC ACID) 500 MG tablet Take 500 mg by mouth daily.     Vitamin D, Ergocalciferol, (DRISDOL) 1.25 MG (50000 UT) CAPS capsule TAKE ONE CAPSULE BY MOUTH TWICE WEEKLY  1   Wheat Dextrin (BENEFIBER DRINK MIX PO) Take 1 Application by mouth 2 (two) times daily.     No current facility-administered medications for this visit.    No Known Allergies  REVIEW OF SYSTEMS (negative unless checked):   Cardiac:  []  Chest pain or chest pressure? []  Shortness of breath upon activity? []  Shortness of breath when lying flat? []  Irregular heart rhythm?  Vascular:  []  Pain in calf, thigh, or hip brought on by walking? []  Pain in feet at night that wakes you up from your sleep? []  Blood clot in your veins? [x]  Leg swelling?  Pulmonary:  []  Oxygen at home? []  Productive cough? []  Wheezing?  Neurologic:  []  Sudden weakness in arms or legs? []  Sudden numbness in arms or legs? []  Sudden onset of difficult speaking or slurred speech? []  Temporary loss of vision in one eye? []  Problems with dizziness?  Gastrointestinal:  []  Blood in stool? []  Vomited blood?  Genitourinary:  []  Burning when urinating? []  Blood in urine?  Psychiatric:  []  Major depression  Hematologic:  []  Bleeding problems? []  Problems with blood clotting?  Dermatologic:  []  Rashes or ulcers?  Constitutional:  []  Fever or  chills?  Ear/Nose/Throat:  []  Change in hearing? []  Nose bleeds? []  Sore throat?  Musculoskeletal:  []  Back pain? []  Joint pain? []  Muscle pain?   Physical Examination     Vitals:   02/13/23 1005  BP: (!) 137/91  Pulse: 79  Resp: 20  Temp: 97.6 F (36.4 C)  SpO2: (!) 79%  Weight: 175 lb 9.6 oz (79.7 kg)  Height: 5\' 1"  (1.549 m)   Body mass index is 33.18 kg/m.  General:  WDWN in NAD; vital signs documented above Gait: Normal  HENT: WNL, normocephalic Pulmonary: normal non-labored breathing Cardiac: regular HR Abdomen: soft Vascular Exam/Pulses: Extremities: with varicose veins BLE, with reticular veins L> R, with edema, with stasis pigmentation L> R leg, with lipodermatosclerosis L > R leg, without ulcers Musculoskeletal: no muscle wasting or atrophy  Neurologic: A&O X 3;  No focal weakness or paresthesias are detected Psychiatric:  The pt has Normal affect.  Non-invasive Vascular Imaging   BLE Venous Insufficiency Duplex (02/13/23):  LLE: No DVT and SVT GSV reflux at [facility], distal thigh and knee GSV diameter 0. 21-0.31 cm SSV reflux popliteal fossa and proximal calf CFV deep venous reflux Noted to have multiple small branches off GSV and SSV in the calf   Medical Decision Making   Vernelle Fluet Sheley is a 72 y.o. female who presents with: LLE chronic venous insufficiency with edema. Duplex today shows no DVT or SVT. She does have reflux in her GSV, SSV and CFV. She is noted to have multiple varicosities off of her GSV and SSV in the calf. Her GSV is very small so based on today's study she would not be good candidate for venous ablation. She is unsure which leg she had previous ablation on but if it was her LLE it has since recanalized. This could explain the small caliber of her vein.  Based on the patient's history and examination, I recommend: daily elevation of 20-30 minutes above level of heart, daily compression stocking use, exercise, weight reduction,  refraining from prolonged sitting or standing Encouraged her to continue to use her knee high compression stockings daily She was provided card for Cherene Julian, RN to schedule sclerotherapy  She would like to have her RLE evaluated as well. I will have her return at her convenience to have reflux study of RLE and I can discuss the findings over the phone with her She can follow up as needed if she has any new or worsening symptoms   Graceann Congress, PA-C [facility] Office: [phone]  02/13/2023, 10:14 AM  Clinic MD: Karin Lieu

## 2023-02-17 ENCOUNTER — Other Ambulatory Visit: Payer: Self-pay

## 2023-02-17 DIAGNOSIS — I87323 Chronic venous hypertension (idiopathic) with inflammation of bilateral lower extremity: Secondary | ICD-10-CM

## 2023-02-17 DIAGNOSIS — I872 Venous insufficiency (chronic) (peripheral): Secondary | ICD-10-CM

## 2023-02-17 DIAGNOSIS — I8311 Varicose veins of right lower extremity with inflammation: Secondary | ICD-10-CM

## 2023-02-19 DIAGNOSIS — R310 Gross hematuria: Secondary | ICD-10-CM | POA: Diagnosis not present

## 2023-02-23 DIAGNOSIS — H5789 Other specified disorders of eye and adnexa: Secondary | ICD-10-CM | POA: Diagnosis not present

## 2023-02-26 ENCOUNTER — Ambulatory Visit: Payer: Medicare Other | Attending: Cardiology

## 2023-02-26 ENCOUNTER — Ambulatory Visit (INDEPENDENT_AMBULATORY_CARE_PROVIDER_SITE_OTHER): Payer: Medicare Other

## 2023-02-26 DIAGNOSIS — R600 Localized edema: Secondary | ICD-10-CM | POA: Insufficient documentation

## 2023-02-26 DIAGNOSIS — I4719 Other supraventricular tachycardia: Secondary | ICD-10-CM

## 2023-02-26 DIAGNOSIS — R0600 Dyspnea, unspecified: Secondary | ICD-10-CM

## 2023-02-26 DIAGNOSIS — M7989 Other specified soft tissue disorders: Secondary | ICD-10-CM

## 2023-02-26 DIAGNOSIS — Z23 Encounter for immunization: Secondary | ICD-10-CM | POA: Diagnosis not present

## 2023-02-26 LAB — ECHOCARDIOGRAM COMPLETE: S' Lateral: 2.6 cm

## 2023-02-26 LAB — VAS US ABI WITH/WO TBI
Left ABI: 1.25
Right ABI: 1.22

## 2023-02-27 ENCOUNTER — Ambulatory Visit (HOSPITAL_COMMUNITY)
Admission: RE | Admit: 2023-02-27 | Discharge: 2023-02-27 | Disposition: A | Discharge status: Home / Self Care | Payer: Medicare Other | Source: Ambulatory Visit | Attending: Vascular Surgery | Admitting: Vascular Surgery

## 2023-02-27 DIAGNOSIS — I872 Venous insufficiency (chronic) (peripheral): Secondary | ICD-10-CM | POA: Insufficient documentation

## 2023-02-27 DIAGNOSIS — I8312 Varicose veins of left lower extremity with inflammation: Secondary | ICD-10-CM | POA: Diagnosis not present

## 2023-02-27 DIAGNOSIS — I87323 Chronic venous hypertension (idiopathic) with inflammation of bilateral lower extremity: Secondary | ICD-10-CM | POA: Diagnosis not present

## 2023-02-27 DIAGNOSIS — I8311 Varicose veins of right lower extremity with inflammation: Secondary | ICD-10-CM | POA: Diagnosis not present

## 2023-02-28 DIAGNOSIS — M25511 Pain in right shoulder: Secondary | ICD-10-CM | POA: Diagnosis not present

## 2023-03-05 ENCOUNTER — Ambulatory Visit: Payer: Medicare Other

## 2023-03-05 NOTE — Progress Notes (Signed)
 " Cardiology Office Note:  .   Date:  03/06/2023  ID:  Molly Hogan, DOB [birth year], MRN [id number] PCP: Street, [name], MD  [facility] Providers Cardiologist:  Redell Leiter, MD    History of Present Illness: .   Molly Hogan is a 72 y.o. female with a past medical history of aortic atherosclerosis noted on CT imaging, Barrett's esophagus, paroxysmal atrial tachycardia, mild mitral regurgitation, pedal edema.  02/27/2023 ultrasound lower extremity venous reflux-chronic thrombus observed involving the proximal and mid great saphenous vein in the right leg 02/26/2023 ABIs were normal 02/26/2023 echo EF 55 to 60%, mild concentric LVH, mild MR 03/20/2021 echo EF 60 to 65%, mild LVH, mild MR  Most recently evaluated by Dr. Leiter on 11/29/2021, she was doing well from a cardiac perspective, no recurrent tachycardia, advised to follow-up in 1 year.  She presented on 01/23/2023 with concerns for pedal edema.  She noticed a few weeks ago she was walking around her house and her legs were apparently so swollen that they were weeping and leaving with spots.  She presented to an urgent care for evaluation, they gave her some cream to put on her lower legs, arranged for venous ultrasound which was negative for DVT.    Evaluated by vascular on 02/13/2023,, was not felt she would be a good candidate for ablation- she had previously had ablations in the past.  She presents today for follow-up, has been doing stable from a cardiac perspective.  She endorses a few episodes where she will wake up in the middle of the night, this could also occur during the day and she has a burning sensation on the right side of her chest that radiates up to the right side of her jaw, typically last for approximately 5 minutes and then subsides, she questions whether this could be related to her Barrett's esophagus. She denies chest pain, palpitations, dyspnea, pnd, orthopnea, n, v, dizziness, syncope,  edema, weight gain, or early satiety.   ROS: Review of Systems  Constitutional: Negative.   HENT: Negative.    Eyes: Negative.   Respiratory:  Positive for shortness of breath.   Cardiovascular:  Positive for claudication and leg swelling.  Gastrointestinal: Negative.   Genitourinary: Negative.   Musculoskeletal: Negative.   Neurological: Negative.   Endo/Heme/Allergies:  Bruises/bleeds easily.  Psychiatric/Behavioral: Negative.       Studies Reviewed: .        Cardiac Studies & Procedures       ECHOCARDIOGRAM  ECHOCARDIOGRAM COMPLETE 02/26/2023  Narrative ECHOCARDIOGRAM REPORT    Patient Name:   Molly Hogan Date of Exam: 02/26/2023 Medical Rec #:  [id number]           Height:       61.0 in Accession #:    [id number]          Weight:       175.6 lb Date of Birth:  1950/09/10           BSA:          1.787 m Patient Age:    72 years            BP:           137/91 mmHg Patient Gender: F                   HR:           76 bpm. Exam Location:  [facility]  Procedure: 2D Echo,  Cardiac Doppler, Color Doppler and Strain Analysis  Indications:    PAT (paroxysmal atrial tachycardia) (HCC) [I47.19 (ICD-10-CM)]; Leg swelling [M79.89 (ICD-10-CM)]; Pedal edema [R60.0 (ICD-10-CM)]; Dyspnea, unspecified type [R06.00 (ICD-10-CM)]  History:        Patient has prior history of Echocardiogram examinations, most recent 03/20/2021.  Sonographer:    Lynwood Silvas RDCS Referring Phys: [phone] Molly Hogan   Sonographer Comments: Suboptimal subcostal window. IMPRESSIONS   1. Left ventricular ejection fraction, by estimation, is 55 to 60%. The left ventricle has normal function. The left ventricle has no regional wall motion abnormalities. There is mild concentric left ventricular hypertrophy. Left ventricular diastolic parameters were normal. The average left ventricular global longitudinal strain is -15.8 %. The global longitudinal strain is abnormal. 2. Right ventricular  systolic function is normal. The right ventricular size is normal. 3. The mitral valve is normal in structure. Mild mitral valve regurgitation. No evidence of mitral stenosis. 4. The aortic valve is normal in structure. Aortic valve regurgitation is not visualized. No aortic stenosis is present. 5. Aortic DTA is NWV. 6. The inferior vena cava is normal in size with greater than 50% respiratory variability, suggesting right atrial pressure of 3 mmHg.  FINDINGS Left Ventricle: Left ventricular ejection fraction, by estimation, is 55 to 60%. The left ventricle has normal function. The left ventricle has no regional wall motion abnormalities. The average left ventricular global longitudinal strain is -15.8 %. The global longitudinal strain is abnormal. The left ventricular internal cavity size was normal in size. There is mild concentric left ventricular hypertrophy. Left ventricular diastolic parameters were normal. Normal left ventricular filling pressure.  Right Ventricle: The right ventricular size is normal. No increase in right ventricular wall thickness. Right ventricular systolic function is normal.  Left Atrium: Left atrial size was normal in size.  Right Atrium: Right atrial size was normal in size.  Pericardium: There is no evidence of pericardial effusion.  Mitral Valve: The mitral valve is normal in structure. Mild mitral valve regurgitation. No evidence of mitral valve stenosis.  Tricuspid Valve: The tricuspid valve is normal in structure. Tricuspid valve regurgitation is trivial. No evidence of tricuspid stenosis.  Aortic Valve: The aortic valve is normal in structure. Aortic valve regurgitation is not visualized. No aortic stenosis is present.  Pulmonic Valve: The pulmonic valve was normal in structure. Pulmonic valve regurgitation is not visualized. No evidence of pulmonic stenosis.  Aorta: The aortic root and ascending aorta are structurally normal, with no evidence of  dilitation, the aortic arch was not well visualized and DTA is NWV.  Venous: The inferior vena cava was not well visualized. The inferior vena cava is normal in size with greater than 50% respiratory variability, suggesting right atrial pressure of 3 mmHg.  IAS/Shunts: No atrial level shunt detected by color flow Doppler.   LEFT VENTRICLE PLAX 2D LVIDd:         3.60 cm   Diastology LVIDs:         2.60 cm   LV e' medial:    6.31 cm/s LV PW:         1.00 cm   LV E/e' medial:  14.4 LV IVS:        1.10 cm   LV e' lateral:   10.70 cm/s LVOT diam:     2.10 cm   LV E/e' lateral: 8.5 LV SV:         67 LV SV Index:   37  2D Longitudinal Strain LVOT Area:     3.46 cm  2D Strain GLS Avg:     -15.8 %   RIGHT VENTRICLE RV Basal diam:  2.70 cm RV S prime:     11.60 cm/s TAPSE (M-mode): 2.6 cm  LEFT ATRIUM             Index        RIGHT ATRIUM           Index LA diam:        3.10 cm 1.73 cm/m   RA Area:     11.10 cm LA Vol (A2C):   50.6 ml 28.31 ml/m  RA Volume:   22.80 ml  12.76 ml/m LA Vol (A4C):   47.7 ml 26.69 ml/m LA Biplane Vol: 51.6 ml 28.87 ml/m AORTIC VALVE LVOT Vmax:   92.25 cm/s LVOT Vmean:  60.250 cm/s LVOT VTI:    0.192 m  AORTA Ao Root diam: 2.90 cm Ao Asc diam:  3.30 cm  MV E velocity: 90.70 cm/s  TRICUSPID VALVE MV A velocity: 92.80 cm/s  TR Peak grad:   18.8 mmHg MV E/A ratio:  0.98        TR Vmax:        217.00 cm/s  SHUNTS Systemic VTI:  0.19 m Systemic Diam: 2.10 cm  Redell Leiter MD Electronically signed by Redell Leiter MD Signature Date/Time: 02/26/2023/12:30:33 PM    Final             Risk Assessment/Calculations:             Physical Exam:   VS:  BP 128/87 (BP Location: Right Arm, Patient Position: Sitting, Cuff Size: Normal)   Pulse 88   Ht 5' 1 (1.549 m)   Wt 177 lb (80.3 kg)   SpO2 98%   BMI 33.44 kg/m    Wt Readings from Last 3 Encounters:  03/06/23 177 lb (80.3 kg)  02/13/23 175 lb 9.6 oz (79.7 kg)  01/23/23 176 lb (79.8  kg)    GEN: Well nourished, well developed in no acute distress NECK: No JVD; No carotid bruits CARDIAC: RRR, no murmurs, rubs, gallops RESPIRATORY:  Clear to auscultation without rales, wheezing or rhonchi  ABDOMEN: Soft, non-tender, non-distended EXTREMITIES:  +2 pedal edema; non-healing wounds; No deformity   ASSESSMENT AND PLAN: .   Paroxysmal atrial tachycardia-quiescent.  Heart rate is well-controlled the office today.  Precordial chest pain-as described above in the HPI, does not sound to be typical of angina, sounds to be more related to GI.  She is going to keep track of this for any contributory causes.  DOE-we repeated her echocardiogram on 02/26/2023,  echo EF 55 to 60%, mild concentric LVH, mild MR.   Pedal edema/claudication-she has been bothered by lower leg swelling for some time, is recently got notably worse.  She typically wears compression socks, elevates her feet when possible.  We arranged for ABIs which were negative, PCP referred her to [facility] and she underwent lower vascular duplex which revealed chronic DVT.  She is considering sclerotherapy.  Dyslipidemia-most recent LDL was elevated at 135, we started her on Lipitor 40 mg, she will have repeat FLP and LFTs in approximately 3 weeks.  Would prefer her LDL to be less than 70.       Dispo: Follow-up in 6 months with me.  Signed, Molly JAYSON Hoover, NP  "

## 2023-03-06 ENCOUNTER — Encounter: Payer: Self-pay | Admitting: Cardiology

## 2023-03-06 ENCOUNTER — Ambulatory Visit: Payer: Medicare Other | Attending: Cardiology | Admitting: Cardiology

## 2023-03-06 VITALS — BP 128/87 | HR 88 | Ht 61.0 in | Wt 177.0 lb

## 2023-03-06 DIAGNOSIS — E7800 Pure hypercholesterolemia, unspecified: Secondary | ICD-10-CM | POA: Diagnosis present

## 2023-03-06 DIAGNOSIS — I872 Venous insufficiency (chronic) (peripheral): Secondary | ICD-10-CM

## 2023-03-06 DIAGNOSIS — I700 Atherosclerosis of aorta: Secondary | ICD-10-CM

## 2023-03-06 DIAGNOSIS — R072 Precordial pain: Secondary | ICD-10-CM | POA: Diagnosis present

## 2023-03-06 DIAGNOSIS — R600 Localized edema: Secondary | ICD-10-CM | POA: Diagnosis present

## 2023-03-06 NOTE — Patient Instructions (Signed)
Medication Instructions:  Your physician recommends that you continue on your current medications as directed. Please refer to the Current Medication list given to you today.  *If you need a refill on your cardiac medications before your next appointment, please call your pharmacy*   Lab Work: NONE If you have labs (blood work) drawn today and your tests are completely normal, you will receive your results only by: MyChart Message (if you have MyChart) OR A paper copy in the mail If you have any lab test that is abnormal or we need to change your treatment, we will call you to review the results.   Testing/Procedures: NONE   Follow-Up: At [facility], you and your health needs are our priority.  As part of our continuing mission to provide you with exceptional heart care, we have created designated Provider Care Teams.  These Care Teams include your primary Cardiologist (physician) and Advanced Practice Providers (APPs -  Physician Assistants and Nurse Practitioners) who all work together to provide you with the care you need, when you need it.  We recommend signing up for the patient portal called "MyChart".  Sign up information is provided on this After Visit Summary.  MyChart is used to connect with patients for Virtual Visits (Telemedicine).  Patients are able to view lab/test results, encounter notes, upcoming appointments, etc.  Non-urgent messages can be sent to your provider as well.   To learn more about what you can do with MyChart, go to [email or web address].    Your next appointment:   6 month(s)  Provider:   Wallis Bamberg, NP Rosalita Levan)    Other Instructions

## 2023-03-13 DIAGNOSIS — G894 Chronic pain syndrome: Secondary | ICD-10-CM | POA: Diagnosis not present

## 2023-03-13 DIAGNOSIS — M791 Myalgia, unspecified site: Secondary | ICD-10-CM | POA: Diagnosis not present

## 2023-03-13 DIAGNOSIS — M5106 Intervertebral disc disorders with myelopathy, lumbar region: Secondary | ICD-10-CM | POA: Diagnosis not present

## 2023-03-21 DIAGNOSIS — M25511 Pain in right shoulder: Secondary | ICD-10-CM | POA: Diagnosis not present

## 2023-03-28 DIAGNOSIS — L7211 Pilar cyst: Secondary | ICD-10-CM | POA: Diagnosis not present

## 2023-03-28 DIAGNOSIS — E7800 Pure hypercholesterolemia, unspecified: Secondary | ICD-10-CM | POA: Diagnosis not present

## 2023-03-29 LAB — LIPID PANEL
Chol/HDL Ratio: 2 ratio (ref 0.0–4.4)
Cholesterol, Total: 144 mg/dL (ref 100–199)
HDL: 71 mg/dL
LDL Chol Calc (NIH): 59 mg/dL (ref 0–99)
Triglycerides: 70 mg/dL (ref 0–149)
VLDL Cholesterol Cal: 14 mg/dL (ref 5–40)

## 2023-03-29 LAB — HEPATIC FUNCTION PANEL
ALT: 22 IU/L (ref 0–32)
AST: 23 IU/L (ref 0–40)
Albumin: 4.1 g/dL (ref 3.8–4.8)
Alkaline Phosphatase: 135 IU/L — ABNORMAL HIGH (ref 44–121)
Bilirubin Total: 0.5 mg/dL (ref 0.0–1.2)
Bilirubin, Direct: 0.19 mg/dL (ref 0.00–0.40)
Total Protein: 5.9 g/dL — ABNORMAL LOW (ref 6.0–8.5)

## 2023-03-31 ENCOUNTER — Telehealth: Payer: Self-pay

## 2023-03-31 NOTE — Telephone Encounter (Signed)
-----   Message from Flossie Dibble sent at 03/29/2023 11:24 AM EST ----- Repeat cholesterol looks good!

## 2023-03-31 NOTE — Telephone Encounter (Signed)
Left VM to return our call

## 2023-04-01 DIAGNOSIS — M75121 Complete rotator cuff tear or rupture of right shoulder, not specified as traumatic: Secondary | ICD-10-CM | POA: Diagnosis not present

## 2023-04-01 DIAGNOSIS — M75120 Complete rotator cuff tear or rupture of unspecified shoulder, not specified as traumatic: Secondary | ICD-10-CM | POA: Insufficient documentation

## 2023-05-08 DIAGNOSIS — K2270 Barrett's esophagus without dysplasia: Secondary | ICD-10-CM | POA: Diagnosis not present

## 2023-05-08 DIAGNOSIS — K5900 Constipation, unspecified: Secondary | ICD-10-CM | POA: Diagnosis not present

## 2023-05-08 DIAGNOSIS — K219 Gastro-esophageal reflux disease without esophagitis: Secondary | ICD-10-CM | POA: Diagnosis not present

## 2023-05-14 ENCOUNTER — Telehealth: Payer: Self-pay | Admitting: Cardiology

## 2023-05-14 NOTE — Telephone Encounter (Signed)
*  STAT* If patient is at the pharmacy, call can be transferred to refill team.   1. Which medications need to be refilled? (please list name of each medication and dose if known)   torsemide  (DEMADEX ) 20 MG tablet    2. Which pharmacy/location (including street and city if local pharmacy) is medication to be sent to?  [facility] [address] - [place], [place] - [address] [place] [address] Phone: [phone]  Fax: [phone]     3. Do they need a 30 day or 90 day supply? 90 Per pt at last visit Molly Hogan told pt to take 1.5 tablet instead of 1 Tablet. Needs New Script with correct dosage

## 2023-05-15 ENCOUNTER — Other Ambulatory Visit: Payer: Self-pay

## 2023-05-15 MED ORDER — TORSEMIDE 20 MG PO TABS: 30.0000 mg | ORAL_TABLET | Freq: Every day | ORAL | 3 refills | Status: AC

## 2023-05-15 NOTE — Telephone Encounter (Signed)
 Left message for the patient to call back.

## 2023-05-15 NOTE — Telephone Encounter (Signed)
 Patient returned call and was informed that her Torsemide medication had been re-filled. Patient verbalized understanding and had no further questions at this time.

## 2023-05-15 NOTE — Progress Notes (Signed)
 Molly Hogan

## 2023-05-19 DIAGNOSIS — R21 Rash and other nonspecific skin eruption: Secondary | ICD-10-CM | POA: Diagnosis not present

## 2023-05-19 DIAGNOSIS — L2089 Other atopic dermatitis: Secondary | ICD-10-CM | POA: Diagnosis not present

## 2023-05-23 DIAGNOSIS — Z6832 Body mass index (BMI) 32.0-32.9, adult: Secondary | ICD-10-CM | POA: Diagnosis not present

## 2023-05-23 DIAGNOSIS — L0390 Cellulitis, unspecified: Secondary | ICD-10-CM | POA: Diagnosis not present

## 2023-05-23 DIAGNOSIS — R509 Fever, unspecified: Secondary | ICD-10-CM | POA: Diagnosis not present

## 2023-05-26 DIAGNOSIS — R509 Fever, unspecified: Secondary | ICD-10-CM | POA: Diagnosis not present

## 2023-05-26 DIAGNOSIS — L0390 Cellulitis, unspecified: Secondary | ICD-10-CM | POA: Diagnosis not present

## 2023-05-30 DIAGNOSIS — D225 Melanocytic nevi of trunk: Secondary | ICD-10-CM | POA: Diagnosis not present

## 2023-05-30 DIAGNOSIS — D2239 Melanocytic nevi of other parts of face: Secondary | ICD-10-CM | POA: Diagnosis not present

## 2023-05-30 DIAGNOSIS — L820 Inflamed seborrheic keratosis: Secondary | ICD-10-CM | POA: Diagnosis not present

## 2023-05-30 DIAGNOSIS — L578 Other skin changes due to chronic exposure to nonionizing radiation: Secondary | ICD-10-CM | POA: Diagnosis not present

## 2023-06-10 DIAGNOSIS — L0390 Cellulitis, unspecified: Secondary | ICD-10-CM | POA: Diagnosis not present

## 2023-06-12 DIAGNOSIS — M5106 Intervertebral disc disorders with myelopathy, lumbar region: Secondary | ICD-10-CM | POA: Diagnosis not present

## 2023-06-12 DIAGNOSIS — M791 Myalgia, unspecified site: Secondary | ICD-10-CM | POA: Diagnosis not present

## 2023-06-12 DIAGNOSIS — G894 Chronic pain syndrome: Secondary | ICD-10-CM | POA: Diagnosis not present

## 2023-06-13 DIAGNOSIS — R310 Gross hematuria: Secondary | ICD-10-CM | POA: Diagnosis not present

## 2023-06-19 DIAGNOSIS — N390 Urinary tract infection, site not specified: Secondary | ICD-10-CM | POA: Diagnosis not present

## 2023-09-12 DIAGNOSIS — M791 Myalgia, unspecified site: Secondary | ICD-10-CM | POA: Diagnosis not present

## 2023-09-12 DIAGNOSIS — G894 Chronic pain syndrome: Secondary | ICD-10-CM | POA: Diagnosis not present

## 2023-09-12 DIAGNOSIS — Z1330 Encounter for screening examination for mental health and behavioral disorders, unspecified: Secondary | ICD-10-CM | POA: Diagnosis not present

## 2023-10-01 DIAGNOSIS — Z6831 Body mass index (BMI) 31.0-31.9, adult: Secondary | ICD-10-CM | POA: Diagnosis not present

## 2023-10-01 DIAGNOSIS — L03115 Cellulitis of right lower limb: Secondary | ICD-10-CM | POA: Diagnosis not present

## 2023-10-05 DIAGNOSIS — L03115 Cellulitis of right lower limb: Secondary | ICD-10-CM | POA: Diagnosis not present

## 2023-10-07 DIAGNOSIS — Z6831 Body mass index (BMI) 31.0-31.9, adult: Secondary | ICD-10-CM | POA: Diagnosis not present

## 2023-10-07 DIAGNOSIS — M25571 Pain in right ankle and joints of right foot: Secondary | ICD-10-CM | POA: Diagnosis not present

## 2023-10-07 DIAGNOSIS — L03115 Cellulitis of right lower limb: Secondary | ICD-10-CM | POA: Diagnosis not present

## 2023-10-16 DIAGNOSIS — M25571 Pain in right ankle and joints of right foot: Secondary | ICD-10-CM | POA: Diagnosis not present

## 2023-10-23 DIAGNOSIS — X58XXXA Exposure to other specified factors, initial encounter: Secondary | ICD-10-CM | POA: Diagnosis not present

## 2023-10-23 DIAGNOSIS — S82831A Other fracture of upper and lower end of right fibula, initial encounter for closed fracture: Secondary | ICD-10-CM | POA: Diagnosis not present

## 2023-10-23 DIAGNOSIS — M65871 Other synovitis and tenosynovitis, right ankle and foot: Secondary | ICD-10-CM | POA: Diagnosis not present

## 2023-11-13 DIAGNOSIS — M84363D Stress fracture, right fibula, subsequent encounter for fracture with routine healing: Secondary | ICD-10-CM | POA: Diagnosis not present

## 2023-11-28 DIAGNOSIS — L7211 Pilar cyst: Secondary | ICD-10-CM | POA: Diagnosis not present

## 2023-11-28 DIAGNOSIS — L720 Epidermal cyst: Secondary | ICD-10-CM | POA: Diagnosis not present

## 2023-12-04 DIAGNOSIS — R001 Bradycardia, unspecified: Secondary | ICD-10-CM | POA: Diagnosis not present

## 2023-12-04 DIAGNOSIS — I87323 Chronic venous hypertension (idiopathic) with inflammation of bilateral lower extremity: Secondary | ICD-10-CM | POA: Diagnosis not present

## 2023-12-04 DIAGNOSIS — E559 Vitamin D deficiency, unspecified: Secondary | ICD-10-CM | POA: Diagnosis not present

## 2023-12-04 DIAGNOSIS — E785 Hyperlipidemia, unspecified: Secondary | ICD-10-CM | POA: Diagnosis not present

## 2023-12-04 DIAGNOSIS — Z0000 Encounter for general adult medical examination without abnormal findings: Secondary | ICD-10-CM | POA: Diagnosis not present

## 2023-12-04 DIAGNOSIS — Z131 Encounter for screening for diabetes mellitus: Secondary | ICD-10-CM | POA: Diagnosis not present

## 2023-12-04 DIAGNOSIS — Z79899 Other long term (current) drug therapy: Secondary | ICD-10-CM | POA: Diagnosis not present

## 2023-12-14 DIAGNOSIS — I872 Venous insufficiency (chronic) (peripheral): Secondary | ICD-10-CM | POA: Diagnosis not present

## 2023-12-14 DIAGNOSIS — R600 Localized edema: Secondary | ICD-10-CM | POA: Diagnosis not present

## 2023-12-14 DIAGNOSIS — R079 Chest pain, unspecified: Secondary | ICD-10-CM | POA: Diagnosis not present

## 2023-12-14 DIAGNOSIS — M7989 Other specified soft tissue disorders: Secondary | ICD-10-CM | POA: Diagnosis not present

## 2023-12-17 DIAGNOSIS — G8929 Other chronic pain: Secondary | ICD-10-CM | POA: Diagnosis not present

## 2023-12-17 DIAGNOSIS — M4185 Other forms of scoliosis, thoracolumbar region: Secondary | ICD-10-CM | POA: Diagnosis not present

## 2023-12-17 DIAGNOSIS — M791 Myalgia, unspecified site: Secondary | ICD-10-CM | POA: Diagnosis not present

## 2023-12-17 DIAGNOSIS — M545 Low back pain, unspecified: Secondary | ICD-10-CM | POA: Diagnosis not present

## 2023-12-17 DIAGNOSIS — M419 Scoliosis, unspecified: Secondary | ICD-10-CM | POA: Diagnosis not present

## 2024-01-02 NOTE — Progress Notes (Unsigned)
 Cardiology Office Note:  .   Date:  01/07/2024  ID:  Molly Hogan, DOB 10-11-1950, MRN [id number] PCP: Street, [name], MD  [facility] Providers Cardiologist:  Redell Leiter, MD    History of Present Illness: .   Molly Hogan is a 73 y.o. female with a past medical history of aortic atherosclerosis noted on CT imaging, history of DVT, Barrett's esophagus, paroxysmal atrial tachycardia, mild mitral regurgitation, venous insufficiency.  02/27/2023 ultrasound lower extremity venous reflux-chronic thrombus observed involving the proximal and mid great saphenous vein in the right leg 02/26/2023 ABIs were normal 02/26/2023 echo EF 55 to 60%, mild concentric LVH, mild MR 03/20/2021 echo EF 60 to 65%, mild LVH, mild MR  She is a longstanding patient of Dr. Leiter, initially established with him in 2020 after she had had a DVT in her left lower leg. She presented on 01/23/2023 with concerns for pedal edema.  She noticed a few weeks ago she was walking around her house and her legs were apparently so swollen that they were weeping and leaving with spots.  She presented to an urgent care for evaluation, they gave her some cream to put on her lower legs, arranged for venous ultrasound which was negative for DVT.    Evaluated by vascular on 02/13/2023,was not felt she would be a good candidate for ablation- she had previously had ablations in the past.  Evaluated by myself on 03/06/2023, stable from a cardiac perspective, was having some episodes of chest pain but did not sound to be consistent with angina, with plans for 32-month follow-up.  She presents today for follow-up, has been doing okay since she was last evaluated in our office.  She does continue to have episodes of chest pain, not necessarily consistent with angina however they have been persistent for some time and I think we need to evaluate this further.  She is following with vascular regarding her venous insufficiency.   She denies  palpitations, dyspnea, pnd, orthopnea, n, v, dizziness, syncope, edema, weight gain, or early satiety.   ROS: Review of Systems  Constitutional: Negative.   HENT: Negative.    Eyes: Negative.   Respiratory:  Positive for shortness of breath.   Cardiovascular:  Positive for chest pain and leg swelling.  Gastrointestinal: Negative.   Genitourinary: Negative.   Musculoskeletal: Negative.   Neurological: Negative.   Endo/Heme/Allergies:  Bruises/bleeds easily.  Psychiatric/Behavioral: Negative.       Studies Reviewed: .        Cardiac Studies & Procedures   ______________________________________________________________________________________________     ECHOCARDIOGRAM  ECHOCARDIOGRAM COMPLETE 02/26/2023  Narrative ECHOCARDIOGRAM REPORT    Patient Name:   Molly Hogan Date of Exam: 02/26/2023 Medical Rec #:  [id number]           Height:       61.0 in Accession #:    [id number]          Weight:       175.6 lb Date of Birth:  13-May-1950           BSA:          1.787 m Patient Age:    72 years            BP:           137/91 mmHg Patient Gender: F                   HR:  76 bpm. Exam Location:  [facility]  Procedure: 2D Echo, Cardiac Doppler, Color Doppler and Strain Analysis  Indications:    PAT (paroxysmal atrial tachycardia) (HCC) [I47.19 (ICD-10-CM)]; Leg swelling [M79.89 (ICD-10-CM)]; Pedal edema [R60.0 (ICD-10-CM)]; Dyspnea, unspecified type [R06.00 (ICD-10-CM)]  History:        Patient has prior history of Echocardiogram examinations, most recent 03/20/2021.  Sonographer:    Lynwood Silvas RDCS Referring Phys: [phone] DELON JAYSON HOOVER   Sonographer Comments: Suboptimal subcostal window. IMPRESSIONS   1. Left ventricular ejection fraction, by estimation, is 55 to 60%. The left ventricle has normal function. The left ventricle has no regional wall motion abnormalities. There is mild concentric left ventricular hypertrophy. Left ventricular  diastolic parameters were normal. The average left ventricular global longitudinal strain is -15.8 %. The global longitudinal strain is abnormal. 2. Right ventricular systolic function is normal. The right ventricular size is normal. 3. The mitral valve is normal in structure. Mild mitral valve regurgitation. No evidence of mitral stenosis. 4. The aortic valve is normal in structure. Aortic valve regurgitation is not visualized. No aortic stenosis is present. 5. Aortic DTA is NWV. 6. The inferior vena cava is normal in size with greater than 50% respiratory variability, suggesting right atrial pressure of 3 mmHg.  FINDINGS Left Ventricle: Left ventricular ejection fraction, by estimation, is 55 to 60%. The left ventricle has normal function. The left ventricle has no regional wall motion abnormalities. The average left ventricular global longitudinal strain is -15.8 %. The global longitudinal strain is abnormal. The left ventricular internal cavity size was normal in size. There is mild concentric left ventricular hypertrophy. Left ventricular diastolic parameters were normal. Normal left ventricular filling pressure.  Right Ventricle: The right ventricular size is normal. No increase in right ventricular wall thickness. Right ventricular systolic function is normal.  Left Atrium: Left atrial size was normal in size.  Right Atrium: Right atrial size was normal in size.  Pericardium: There is no evidence of pericardial effusion.  Mitral Valve: The mitral valve is normal in structure. Mild mitral valve regurgitation. No evidence of mitral valve stenosis.  Tricuspid Valve: The tricuspid valve is normal in structure. Tricuspid valve regurgitation is trivial. No evidence of tricuspid stenosis.  Aortic Valve: The aortic valve is normal in structure. Aortic valve regurgitation is not visualized. No aortic stenosis is present.  Pulmonic Valve: The pulmonic valve was normal in structure. Pulmonic  valve regurgitation is not visualized. No evidence of pulmonic stenosis.  Aorta: The aortic root and ascending aorta are structurally normal, with no evidence of dilitation, the aortic arch was not well visualized and DTA is NWV.  Venous: The inferior vena cava was not well visualized. The inferior vena cava is normal in size with greater than 50% respiratory variability, suggesting right atrial pressure of 3 mmHg.  IAS/Shunts: No atrial level shunt detected by color flow Doppler.   LEFT VENTRICLE PLAX 2D LVIDd:         3.60 cm   Diastology LVIDs:         2.60 cm   LV e' medial:    6.31 cm/s LV PW:         1.00 cm   LV E/e' medial:  14.4 LV IVS:        1.10 cm   LV e' lateral:   10.70 cm/s LVOT diam:     2.10 cm   LV E/e' lateral: 8.5 LV SV:         67 LV SV  Index:   37        2D Longitudinal Strain LVOT Area:     3.46 cm  2D Strain GLS Avg:     -15.8 %   RIGHT VENTRICLE RV Basal diam:  2.70 cm RV S prime:     11.60 cm/s TAPSE (M-mode): 2.6 cm  LEFT ATRIUM             Index        RIGHT ATRIUM           Index LA diam:        3.10 cm 1.73 cm/m   RA Area:     11.10 cm LA Vol (A2C):   50.6 ml 28.31 ml/m  RA Volume:   22.80 ml  12.76 ml/m LA Vol (A4C):   47.7 ml 26.69 ml/m LA Biplane Vol: 51.6 ml 28.87 ml/m AORTIC VALVE LVOT Vmax:   92.25 cm/s LVOT Vmean:  60.250 cm/s LVOT VTI:    0.192 m  AORTA Ao Root diam: 2.90 cm Ao Asc diam:  3.30 cm  MV E velocity: 90.70 cm/s  TRICUSPID VALVE MV A velocity: 92.80 cm/s  TR Peak grad:   18.8 mmHg MV E/A ratio:  0.98        TR Vmax:        217.00 cm/s  SHUNTS Systemic VTI:  0.19 m Systemic Diam: 2.10 cm  Redell Leiter MD Electronically signed by Redell Leiter MD Signature Date/Time: 02/26/2023/12:30:33 PM    Final          ______________________________________________________________________________________________      Risk Assessment/Calculations:             Physical Exam:   VS:  BP 104/68   Pulse 84   Ht  5' 1 (1.549 m)   Wt 169 lb 6.4 oz (76.8 kg)   SpO2 97%   BMI 32.01 kg/m    Wt Readings from Last 3 Encounters:  01/06/24 169 lb 6.4 oz (76.8 kg)  03/06/23 177 lb (80.3 kg)  02/13/23 175 lb 9.6 oz (79.7 kg)    GEN: Well nourished, well developed in no acute distress NECK: No JVD; No carotid bruits CARDIAC: RRR, no murmurs, rubs, gallops RESPIRATORY:  Clear to auscultation without rales, wheezing or rhonchi  ABDOMEN: Soft, non-tender, non-distended EXTREMITIES:  +1 pedal edema; non-healing wounds; No deformity   ASSESSMENT AND PLAN: .   Precordial chest pain-she was having episodes of chest pain, they do not sound to be consistent with angina however they have been persistent for some time, somewhat hard for her to explain, she has also previously had a CT of her chest which revealed scattered foci in her coronary arteries.  Will arrange for coronary CTA.  Will premedicate with metoprolol .  She is already on aspirin 81 mg daily, Lipitor 40 mg daily.  Paroxysmal atrial tachycardia-quiescent.  Heart rate is well-controlled the office today.  DOE-we repeated her echocardiogram on 02/26/2023,  echo EF 55 to 60%, mild concentric LVH, mild MR. Will check proBNP.  Pedal edema/venous insufficiency she has been bothered by lower leg swelling for some time, is recently got notably worse.  She typically wears compression socks, elevates her feet when possible.  We arranged for ABIs which were negative, PCP referred her to [facility] and she underwent lower vascular duplex which revealed chronic DVT, she is following up with vascular regarding this..    Dyslipidemia-most recent LDL was controlled at 59 in November 2024, continue Lipitor 40 mg daily.  Dispo: Coronary CTA, BMET, proBNP.  Signed, Delon JAYSON Hoover, NP

## 2024-01-06 ENCOUNTER — Encounter: Payer: Self-pay | Admitting: Cardiology

## 2024-01-06 ENCOUNTER — Ambulatory Visit: Attending: Cardiology | Admitting: Cardiology

## 2024-01-06 VITALS — BP 104/68 | HR 84 | Ht 61.0 in | Wt 169.4 lb

## 2024-01-06 DIAGNOSIS — R072 Precordial pain: Secondary | ICD-10-CM | POA: Diagnosis not present

## 2024-01-06 DIAGNOSIS — R0600 Dyspnea, unspecified: Secondary | ICD-10-CM

## 2024-01-06 DIAGNOSIS — I872 Venous insufficiency (chronic) (peripheral): Secondary | ICD-10-CM | POA: Diagnosis not present

## 2024-01-06 DIAGNOSIS — Z01812 Encounter for preprocedural laboratory examination: Secondary | ICD-10-CM

## 2024-01-06 DIAGNOSIS — I700 Atherosclerosis of aorta: Secondary | ICD-10-CM

## 2024-01-06 DIAGNOSIS — E782 Mixed hyperlipidemia: Secondary | ICD-10-CM | POA: Diagnosis not present

## 2024-01-06 MED ORDER — METOPROLOL TARTRATE 100 MG PO TABS: 100.0000 mg | ORAL_TABLET | Freq: Once | ORAL | 0 refills | Status: AC

## 2024-01-06 NOTE — Patient Instructions (Signed)
 Medication Instructions:                                                                                                  *If you need a refill on your cardiac medications before your next appointment, please call your pharmacy*  Lab Work: Your physician recommends that you return for lab work in: Today for BMP and ProBNP  If you have labs (blood work) drawn today and your tests are completely normal, you will receive your results only by: MyChart Message (if you have MyChart) OR A paper copy in the mail If you have any lab test that is abnormal or we need to change your treatment, we will call you to review the results.  Testing/Procedures:   Your cardiac CT will be scheduled at one of the below locations:    [facility] [address] [place], [place] [phone]   Please follow these instructions carefully (unless otherwise directed):  An IV will be required for this test and Nitroglycerin will be given.   On the Night Before the Test: Be sure to Drink plenty of water. Do not consume any caffeinated/decaffeinated beverages or chocolate 12 hours prior to your test. Do not take any antihistamines 12 hours prior to your test.   On the Day of the Test: Drink plenty of water until 1 hour prior to the test. Do not eat any food 1 hour prior to test. You may take your regular medications prior to the test.  Take metoprolol  (Lopressor ) two hours prior to test. If you take Furosemide/Hydrochlorothiazide/Spironolactone/Chlorthalidone, please HOLD on the morning of the test. Patients who wear a continuous glucose monitor MUST remove the device prior to scanning. FEMALES- please wear underwire-free bra if available, avoid dresses & tight clothing After the Test: Drink plenty of water. After receiving IV contrast, you may experience a mild flushed feeling. This is normal. On occasion, you may experience a mild rash up to 24 hours after the test. This is not dangerous. If this  occurs, you can take Benadryl 25 mg, Zyrtec, Claritin, or Allegra and increase your fluid intake. (Patients taking Tikosyn should avoid Benadryl, and may take Zyrtec, Claritin, or Allegra) If you experience trouble breathing, this can be serious. If it is severe call 911 IMMEDIATELY. If it is mild, please call our office.  We will call to schedule your test 2-4 weeks out understanding that some insurance companies will need an authorization prior to the service being performed.   For more information and frequently asked questions, please visit our website : [email or web address]  For non-scheduling related questions, please contact the cardiac imaging nurse navigator should you have any questions/concerns: Cardiac Imaging Nurse Navigators Direct Office Dial: [phone]   For scheduling needs, including cancellations and rescheduling, please call [place], [phone].   Follow-Up: At [facility], you and your health needs are our priority.  As part of our continuing mission to provide you with exceptional heart care, our providers are all part of one team.  This team includes your primary Cardiologist (physician) and Advanced Practice Providers or APPs (Physician Assistants and  Nurse Practitioners) who all work together to provide you with the care you need, when you need it.  Your next appointment:   6 month(s)  Provider:   Redell Leiter, MD or Delon Hoover, NP [place])    We recommend signing up for the patient portal called MyChart.  Sign up information is provided on this After Visit Summary.  MyChart is used to connect with patients for Virtual Visits (Telemedicine).  Patients are able to view lab/test results, encounter notes, upcoming appointments, etc.  Non-urgent messages can be sent to your provider as well.   To learn more about what you can do with MyChart, go to [email or web address].   Other Instructions

## 2024-01-07 ENCOUNTER — Telehealth: Payer: Self-pay

## 2024-01-07 ENCOUNTER — Ambulatory Visit: Payer: Self-pay | Admitting: Cardiology

## 2024-01-07 LAB — BASIC METABOLIC PANEL WITH GFR
BUN/Creatinine Ratio: 20 (ref 12–28)
BUN: 18 mg/dL (ref 8–27)
CO2: 26 mmol/L (ref 20–29)
Calcium: 9.2 mg/dL (ref 8.7–10.3)
Chloride: 100 mmol/L (ref 96–106)
Creatinine, Ser: 0.89 mg/dL (ref 0.57–1.00)
Glucose: 81 mg/dL (ref 70–99)
Potassium: 3.8 mmol/L (ref 3.5–5.2)
Sodium: 142 mmol/L (ref 134–144)
eGFR: 69 mL/min/1.73

## 2024-01-07 LAB — PRO B NATRIURETIC PEPTIDE: NT-Pro BNP: 176 pg/mL (ref 0–301)

## 2024-01-07 NOTE — Telephone Encounter (Signed)
 Left message on My Chart with lab results per Dr. Karry note. Routed to PCP

## 2024-01-08 DIAGNOSIS — R609 Edema, unspecified: Secondary | ICD-10-CM | POA: Diagnosis not present

## 2024-01-08 DIAGNOSIS — R0609 Other forms of dyspnea: Secondary | ICD-10-CM | POA: Diagnosis not present

## 2024-01-08 DIAGNOSIS — I872 Venous insufficiency (chronic) (peripheral): Secondary | ICD-10-CM | POA: Diagnosis not present

## 2024-01-12 ENCOUNTER — Other Ambulatory Visit: Payer: Self-pay

## 2024-01-12 DIAGNOSIS — E7800 Pure hypercholesterolemia, unspecified: Secondary | ICD-10-CM

## 2024-01-12 MED ORDER — ATORVASTATIN CALCIUM 40 MG PO TABS: 40.0000 mg | ORAL_TABLET | Freq: Every day | ORAL | 3 refills | Status: DC

## 2024-01-13 ENCOUNTER — Telehealth: Payer: Self-pay

## 2024-01-13 NOTE — Telephone Encounter (Signed)
-----   Message from Delon JAYSON Hoover sent at 01/13/2024 12:56 PM EDT ----- Regarding: FW: delay in CCTA/Echo Please see below.  I am not entirely sure what the ask is,  Precordial chest pain-she was having episodes of chest pain, they do not sound to be consistent with angina however they have been persistent for some time, somewhat hard for her to explain, she has also previously had a CT of her chest which revealed scattered foci in her coronary arteries.  Will arrange for coronary CTA.  Will premedicate with metoprolol .  She is already on aspirin 81 mg daily, Lipitor 40 mg daily.  The above was my recommendations, for a coronary CTA, looks like they went to schedule and she mentioned a MRI, but I do not see any evidence of this in her [facility] chart.   Can we try to see what she is referring to? ----- Message ----- From: Prentiss Camie HERO, RN Sent: 01/12/2024   4:10 PM EDT To: Redell JINNY Leiter, MD; Delon JAYSON Hoover, NP Subject: RE: delay in CCTA/Echo                         Thank you! SW ----- Message ----- From: Leiter Redell JINNY, MD Sent: 01/12/2024   1:00 PM EDT To: Camie HERO Prentiss, RN; Delon JAYSON Hoover, NP Subject: RE: delay in CCTA/Echo                         I am away from the office this week I am pretty sure was not me that saw her at  [facility] and I cannot access those records remotely.  I will copy this to Delon Hoover nurse practitioner who saw her in the office most recently ----- Message ----- From: Prentiss Camie HERO, RN Sent: 01/12/2024  12:56 PM EDT To: Redell JINNY Leiter, MD; Laymon LITTIE Quivers; Kately# Subject: delay in CCTA/Echo                             Hey I attempted to call this patient today to sched CCTA however she said she saw a vascular doc at [facility] who wants a cardiac MRI done first. Theres currently no order for CMR and wanted to inform you of the delay in testing for the other two orders ( CCTA and Echo).  Thank you,  Camie Prentiss RN Navigator Cardiac  Imaging [facility] [phone] Office  [phone] Cell

## 2024-01-13 NOTE — Telephone Encounter (Signed)
 Left vm to return call  Hey I attempted to call this patient today to sched CCTA however she said she saw a vascular doc at [facility] who wants a cardiac MRI done first. Theres currently no order for CMR and wanted to inform you of the delay in testing for the other two orders ( CCTA and Echo).   Thank you,   Camie Shutter RN Navigator Cardiac Imaging  [facility]  [phone] Office  [phone] Cell

## 2024-01-14 NOTE — Telephone Encounter (Signed)
 Spoke with pt who states that she [facility] called and states she needs an echo. Advised that she had an echo 02/2023.

## 2024-01-14 NOTE — Telephone Encounter (Signed)
 Spoke with pt who states that she was sent to the Heart and Vascular Center at [facility] by Dr. Rusty for her legs. Pt states that the provider at that office wanted her to have a MRI but she is unsure what type of MRI she needs. Pt states that she will call them with type of MRI and get back to us . Advised that at her last visit she needed a cardiac CT to evaluate her coronary arteries.

## 2024-01-15 DIAGNOSIS — R5381 Other malaise: Secondary | ICD-10-CM | POA: Diagnosis not present

## 2024-01-15 DIAGNOSIS — R5383 Other fatigue: Secondary | ICD-10-CM | POA: Diagnosis not present

## 2024-01-15 DIAGNOSIS — M419 Scoliosis, unspecified: Secondary | ICD-10-CM | POA: Diagnosis not present

## 2024-01-15 DIAGNOSIS — G4700 Insomnia, unspecified: Secondary | ICD-10-CM | POA: Diagnosis not present

## 2024-01-15 DIAGNOSIS — M47816 Spondylosis without myelopathy or radiculopathy, lumbar region: Secondary | ICD-10-CM | POA: Diagnosis not present

## 2024-01-16 DIAGNOSIS — R0609 Other forms of dyspnea: Secondary | ICD-10-CM | POA: Diagnosis not present

## 2024-01-22 DIAGNOSIS — M545 Low back pain, unspecified: Secondary | ICD-10-CM | POA: Diagnosis not present

## 2024-01-22 DIAGNOSIS — M4185 Other forms of scoliosis, thoracolumbar region: Secondary | ICD-10-CM | POA: Diagnosis not present

## 2024-01-22 DIAGNOSIS — M47816 Spondylosis without myelopathy or radiculopathy, lumbar region: Secondary | ICD-10-CM | POA: Diagnosis not present

## 2024-01-27 DIAGNOSIS — R609 Edema, unspecified: Secondary | ICD-10-CM | POA: Diagnosis not present

## 2024-01-27 DIAGNOSIS — R0609 Other forms of dyspnea: Secondary | ICD-10-CM | POA: Diagnosis not present

## 2024-01-27 DIAGNOSIS — I872 Venous insufficiency (chronic) (peripheral): Secondary | ICD-10-CM | POA: Diagnosis not present

## 2024-01-30 DIAGNOSIS — D485 Neoplasm of uncertain behavior of skin: Secondary | ICD-10-CM | POA: Diagnosis not present

## 2024-02-03 ENCOUNTER — Telehealth: Payer: Self-pay | Admitting: Cardiology

## 2024-02-03 DIAGNOSIS — E7800 Pure hypercholesterolemia, unspecified: Secondary | ICD-10-CM

## 2024-02-03 MED ORDER — ATORVASTATIN CALCIUM 40 MG PO TABS: 40.0000 mg | ORAL_TABLET | Freq: Every day | ORAL | 3 refills | Status: AC

## 2024-02-03 NOTE — Telephone Encounter (Signed)
 *  STAT* If patient is at the pharmacy, call can be transferred to refill team.   1. Which medications need to be refilled? (please list name of each medication and dose if known)   atorvastatin  (LIPITOR) 40 MG tablet   2. Which pharmacy/location (including street and city if local pharmacy) is medication to be sent to?  [facility] - [place], [place] - [address]    3. Do they need a 30 day or 90 day supply? 90 day  Pharmacy called stating that they did not receive her new prescription

## 2024-02-03 NOTE — Telephone Encounter (Signed)
 Pt's medication was sent to pt's pharmacy as requested. Confirmation received.

## 2024-02-10 ENCOUNTER — Encounter (HOSPITAL_COMMUNITY): Payer: Self-pay

## 2024-02-17 DIAGNOSIS — I872 Venous insufficiency (chronic) (peripheral): Secondary | ICD-10-CM | POA: Diagnosis not present

## 2024-02-17 DIAGNOSIS — R0609 Other forms of dyspnea: Secondary | ICD-10-CM | POA: Diagnosis not present

## 2024-02-26 DIAGNOSIS — Z1231 Encounter for screening mammogram for malignant neoplasm of breast: Secondary | ICD-10-CM | POA: Diagnosis not present

## 2024-02-27 DIAGNOSIS — Z1231 Encounter for screening mammogram for malignant neoplasm of breast: Secondary | ICD-10-CM | POA: Diagnosis not present

## 2024-03-03 ENCOUNTER — Telehealth (HOSPITAL_COMMUNITY): Payer: Self-pay | Admitting: *Deleted

## 2024-03-03 NOTE — Telephone Encounter (Signed)

## 2024-03-04 ENCOUNTER — Inpatient Hospital Stay (HOSPITAL_BASED_OUTPATIENT_CLINIC_OR_DEPARTMENT_OTHER)
Admission: RE | Admit: 2024-03-04 | Discharge: 2024-03-04 | Disposition: A | Source: Ambulatory Visit | Attending: Cardiology | Admitting: Cardiology

## 2024-03-04 DIAGNOSIS — R072 Precordial pain: Secondary | ICD-10-CM | POA: Diagnosis not present

## 2024-03-04 MED ORDER — IOHEXOL 350 MG/ML SOLN
100.0000 mL | Freq: Once | INTRAVENOUS | Status: AC | PRN
  Administered 2024-03-04: 100 mL via INTRAVENOUS

## 2024-03-04 MED ORDER — NITROGLYCERIN 0.4 MG SL SUBL
0.8000 mg | SUBLINGUAL_TABLET | Freq: Once | SUBLINGUAL | Status: AC
  Administered 2024-03-04: 0.8 mg via SUBLINGUAL

## 2024-03-08 ENCOUNTER — Telehealth: Payer: Self-pay

## 2024-03-08 DIAGNOSIS — R059 Cough, unspecified: Secondary | ICD-10-CM | POA: Diagnosis not present

## 2024-03-08 DIAGNOSIS — M542 Cervicalgia: Secondary | ICD-10-CM | POA: Diagnosis not present

## 2024-03-08 DIAGNOSIS — Z6832 Body mass index (BMI) 32.0-32.9, adult: Secondary | ICD-10-CM | POA: Diagnosis not present

## 2024-03-08 DIAGNOSIS — R6883 Chills (without fever): Secondary | ICD-10-CM | POA: Diagnosis not present

## 2024-03-08 NOTE — Telephone Encounter (Signed)
 Pt viewed CT results on My Chart per Delon Carlin Bari note. Routed to PCP.

## 2024-03-18 DIAGNOSIS — M47816 Spondylosis without myelopathy or radiculopathy, lumbar region: Secondary | ICD-10-CM | POA: Diagnosis not present

## 2024-03-25 DIAGNOSIS — Z23 Encounter for immunization: Secondary | ICD-10-CM | POA: Diagnosis not present

## 2024-04-06 DIAGNOSIS — M47816 Spondylosis without myelopathy or radiculopathy, lumbar region: Secondary | ICD-10-CM | POA: Diagnosis not present

## 2024-04-09 DIAGNOSIS — M25559 Pain in unspecified hip: Secondary | ICD-10-CM | POA: Diagnosis not present

## 2024-04-09 DIAGNOSIS — J40 Bronchitis, not specified as acute or chronic: Secondary | ICD-10-CM | POA: Diagnosis not present

## 2024-04-09 DIAGNOSIS — D225 Melanocytic nevi of trunk: Secondary | ICD-10-CM | POA: Diagnosis not present

## 2024-04-09 DIAGNOSIS — D2239 Melanocytic nevi of other parts of face: Secondary | ICD-10-CM | POA: Diagnosis not present

## 2024-04-09 DIAGNOSIS — I872 Venous insufficiency (chronic) (peripheral): Secondary | ICD-10-CM | POA: Diagnosis not present

## 2024-04-09 DIAGNOSIS — R059 Cough, unspecified: Secondary | ICD-10-CM | POA: Diagnosis not present

## 2024-04-09 DIAGNOSIS — D485 Neoplasm of uncertain behavior of skin: Secondary | ICD-10-CM | POA: Diagnosis not present

## 2024-04-09 DIAGNOSIS — L820 Inflamed seborrheic keratosis: Secondary | ICD-10-CM | POA: Diagnosis not present

## 2024-04-09 DIAGNOSIS — K2270 Barrett's esophagus without dysplasia: Secondary | ICD-10-CM | POA: Diagnosis not present

## 2024-04-09 DIAGNOSIS — G5711 Meralgia paresthetica, right lower limb: Secondary | ICD-10-CM | POA: Diagnosis not present
